# Patient Record
Sex: Male | Born: 1950 | Race: White | Hispanic: No | Marital: Married | Smoking: Never smoker
Health system: Southern US, Community
[De-identification: ages and names within clinical notes are randomized; demographics above are authoritative.]

## PROBLEM LIST (undated history)

## (undated) DIAGNOSIS — I499 Cardiac arrhythmia, unspecified: Secondary | ICD-10-CM

## (undated) DIAGNOSIS — H43813 Vitreous degeneration, bilateral: Secondary | ICD-10-CM

## (undated) DIAGNOSIS — L570 Actinic keratosis: Secondary | ICD-10-CM

## (undated) DIAGNOSIS — M1990 Unspecified osteoarthritis, unspecified site: Secondary | ICD-10-CM

## (undated) DIAGNOSIS — B019 Varicella without complication: Secondary | ICD-10-CM

## (undated) DIAGNOSIS — I4891 Unspecified atrial fibrillation: Secondary | ICD-10-CM

## (undated) DIAGNOSIS — T7840XA Allergy, unspecified, initial encounter: Secondary | ICD-10-CM

## (undated) DIAGNOSIS — Z85828 Personal history of other malignant neoplasm of skin: Secondary | ICD-10-CM

## (undated) DIAGNOSIS — I483 Typical atrial flutter: Secondary | ICD-10-CM

## (undated) DIAGNOSIS — H269 Unspecified cataract: Secondary | ICD-10-CM

## (undated) HISTORY — DX: Typical atrial flutter: I48.3

## (undated) HISTORY — DX: Unspecified osteoarthritis, unspecified site: M19.90

## (undated) HISTORY — PX: MOHS SURGERY: SUR867

## (undated) HISTORY — PX: EYE SURGERY: SHX253

## (undated) HISTORY — DX: Allergy, unspecified, initial encounter: T78.40XA

## (undated) HISTORY — DX: Vitreous degeneration, bilateral: H43.813

## (undated) HISTORY — DX: Actinic keratosis: L57.0

## (undated) HISTORY — DX: Cardiac arrhythmia, unspecified: I49.9

## (undated) HISTORY — DX: Unspecified atrial fibrillation: I48.91

## (undated) HISTORY — DX: Varicella without complication: B01.9

## (undated) HISTORY — DX: Unspecified cataract: H26.9

---
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
## 2014-03-19 HISTORY — PX: CATARACT EXTRACTION, BILATERAL: SHX1313

## 2015-03-24 DIAGNOSIS — E785 Hyperlipidemia, unspecified: Secondary | ICD-10-CM | POA: Insufficient documentation

## 2015-11-29 ENCOUNTER — Ambulatory Visit (INDEPENDENT_AMBULATORY_CARE_PROVIDER_SITE_OTHER): Payer: Medicare Other | Admitting: Primary Care

## 2015-11-29 ENCOUNTER — Encounter: Payer: Self-pay | Admitting: Primary Care

## 2015-11-29 VITALS — BP 118/82 | HR 51 | Temp 97.5°F | Ht 69.75 in | Wt 170.8 lb

## 2015-11-29 DIAGNOSIS — N400 Enlarged prostate without lower urinary tract symptoms: Secondary | ICD-10-CM | POA: Diagnosis not present

## 2015-11-29 DIAGNOSIS — I480 Paroxysmal atrial fibrillation: Secondary | ICD-10-CM

## 2015-11-29 DIAGNOSIS — H1010 Acute atopic conjunctivitis, unspecified eye: Secondary | ICD-10-CM | POA: Insufficient documentation

## 2015-11-29 DIAGNOSIS — Z9079 Acquired absence of other genital organ(s): Secondary | ICD-10-CM | POA: Insufficient documentation

## 2015-11-29 DIAGNOSIS — H1013 Acute atopic conjunctivitis, bilateral: Secondary | ICD-10-CM | POA: Diagnosis not present

## 2015-11-29 MED ORDER — OLOPATADINE HCL 0.1 % OP SOLN: 1.0000 [drp] | Freq: Two times a day (BID) | OPHTHALMIC | 1 refills | Status: DC

## 2015-11-29 NOTE — Assessment & Plan Note (Signed)
Managed on Flomax, overall feels this is working well. Continue same. Check PSA at upcoming physical.

## 2015-11-29 NOTE — Assessment & Plan Note (Signed)
Managed on Multaq 400 mg and aspirin 325 mg. Referral placed for local cardiologist. Rate and rhythm regular today.

## 2015-11-29 NOTE — Progress Notes (Signed)
Subjective:    Patient ID: Brian Glass, male    DOB: 26-Mar-1950, 65 y.o.   MRN: [id number]  HPI  Brian Glass is a 65 year old male who presents today to establish care and discuss the problems mentioned below. Will obtain old records. His last physical was around 1 year ago.   1) BPH: Diagnosed years ago. Currently managed on Flomax 0.4 mg capsules. He was once managed on Proscar and several other medications without improvement. He did experience postural hypotension on other medications.   2) Allergic Conjunctivitis: History of seasonal allergies. He's been taking Flonase and OTC antihistamine drops. His symptoms include burning, watering, itching to his eyes. He was once managed on Patanol drops with improvement and is requesting a prescription today. Denies discharge, fevers, injection to his eyes.  3) Atrial Fibrillation: Currently managed on Aspirin 325 mg and Multaq 400 mg. He was previously following through [facility], but his current cardiologist is retiring. He leads an active lifestyle through biking. He tried to wean off of Multaq but experienced tachycardia during the weaning process. He would like to establish with a local cardiologist as he has recently moved to this area.  Review of Systems  Constitutional: Negative for fever.  HENT: Positive for congestion.   Eyes: Positive for itching. Negative for pain, discharge, redness and visual disturbance.  Respiratory: Negative for cough.   Cardiovascular: Negative for chest pain and palpitations.  Genitourinary: Negative for difficulty urinating.       Past Medical History:  Diagnosis Date  . Allergy   . Arthritis   . Atrial fibrillation ([place])   . Bilateral vitreous detachment   . Chickenpox      Social History   Social History  . Marital status: Married    Spouse name: N/A  . Number of children: N/A  . Years of education: N/A   Occupational History  . Not on file.   Social History Main Topics  . Smoking status:  Never Smoker  . Smokeless tobacco: Never Used  . Alcohol use Yes     Comment: soical  . Drug use: Unknown  . Sexual activity: Not on file   Other Topics Concern  . Not on file   Social History Narrative   Married.   3 children. 2 grandchildren.   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    Past Surgical History:  Procedure Laterality Date  . CATARACT EXTRACTION, BILATERAL  2016  . TONSILLECTOMY  1955    Family History  Problem Relation Age of Onset  . Hyperlipidemia Mother   . Heart disease Mother   . Hypertension Mother   . Hyperlipidemia Father   . Heart disease Father   . Stroke Father   . Hypertension Father   . Heart disease Brother   . Prostate cancer Brother   . Atrial fibrillation Brother   . Sleep apnea Brother     Allergies  Allergen Reactions  . Sudafed [Pseudoephedrine Hcl]     No current outpatient prescriptions on file prior to visit.   No current facility-administered medications on file prior to visit.     BP 118/82   Pulse (!) 51   Temp 97.5 F (36.4 C) (Oral)   Ht 5' 9.75" (1.772 m)   Wt 170 lb 12.8 oz (77.5 kg)   SpO2 98%   BMI 24.68 kg/m    Objective:   Physical Exam  Constitutional: He is oriented to person, place, and time. He  appears well-nourished.  Eyes: Conjunctivae are normal. Right eye exhibits no discharge. Left eye exhibits no discharge.  Neck: Neck supple.  Cardiovascular: Normal rate and regular rhythm.   Pulmonary/Chest: Effort normal and breath sounds normal. He has no wheezes. He has no rales.  Neurological: He is alert and oriented to person, place, and time.  Skin: Skin is warm and dry.  Psychiatric: He has a normal mood and affect.          Assessment & Plan:

## 2015-11-29 NOTE — Progress Notes (Signed)
Pre visit review using our clinic review tool, if applicable. No additional management support is needed unless otherwise documented below in the visit note. 

## 2015-11-29 NOTE — Patient Instructions (Signed)
You will be contacted regarding your referral to Cardiology.  Please let us know if you have not heard back within one week.   I sent a prescription for Patanol eye drops to your pharmacy. Instill 1 drop into both eyes twice daily as needed.  Please schedule a physical with me in December/January. You may also schedule a lab only appointment 3-4 days prior. We will discuss your lab results in detail during your physical.  It was a pleasure to meet you today! Please don't hesitate to call me with any questions. Welcome to [facility]!

## 2015-11-29 NOTE — Assessment & Plan Note (Signed)
History of seasonal allergies. Exam today without evidence of bacterial involvement. Rx for Patanol provided.

## 2015-11-30 ENCOUNTER — Encounter: Payer: Self-pay | Admitting: Cardiology

## 2015-11-30 ENCOUNTER — Ambulatory Visit (INDEPENDENT_AMBULATORY_CARE_PROVIDER_SITE_OTHER): Payer: Medicare Other | Admitting: Cardiology

## 2015-11-30 ENCOUNTER — Encounter: Payer: Self-pay | Admitting: Primary Care

## 2015-11-30 VITALS — BP 108/68 | HR 48 | Ht 69.0 in | Wt 169.0 lb

## 2015-11-30 DIAGNOSIS — I440 Atrioventricular block, first degree: Secondary | ICD-10-CM | POA: Diagnosis not present

## 2015-11-30 DIAGNOSIS — I480 Paroxysmal atrial fibrillation: Secondary | ICD-10-CM

## 2015-11-30 DIAGNOSIS — R001 Bradycardia, unspecified: Secondary | ICD-10-CM

## 2015-11-30 NOTE — Patient Instructions (Addendum)
Medication Instructions:  Your physician has recommended you make the following change in your medication:  1. DECREASE Multaq to 200 mg Twice daily  Testing/Procedures: Your physician has recommended that you wear a holter monitor. Holter monitors are medical devices that record the heart's electrical activity. Doctors most often use these monitors to diagnose arrhythmias. Arrhythmias are problems with the speed or rhythm of the heartbeat. The monitor is a small, portable device. You can wear one while you do your normal daily activities. This is usually used to diagnose what is causing palpitations/syncope (passing out).  Your physician has requested that you have an echocardiogram. Echocardiography is a painless test that uses sound waves to create images of your heart. It provides your doctor with information about the size and shape of your heart and how well your heart's chambers and valves are working. This procedure takes approximately one hour. There are no restrictions for this procedure.    Follow-Up: Your physician recommends that you schedule a follow-up appointment in: 1 month with Dr. Yvone Neu.  It was a pleasure seeing you today here in the office. Please do not hesitate to give [place] a call back if you have any further questions. [place], BSN      Echocardiogram An echocardiogram, or echocardiography, uses sound waves (ultrasound) to produce an image of your heart. The echocardiogram is simple, painless, obtained within a short period of time, and offers valuable information to your health care provider. The images from an echocardiogram can provide information such as:  Evidence of coronary artery disease (CAD).  Heart size.  Heart muscle function.  Heart valve function.  Aneurysm detection.  Evidence of a past heart attack.  Fluid buildup around the heart.  Heart muscle thickening.  Assess heart valve function. LET [facility] CARE PROVIDER  KNOW ABOUT:  Any allergies you have.  All medicines you are taking, including vitamins, herbs, eye drops, creams, and over-the-counter medicines.  Previous problems you or members of your family have had with the use of anesthetics.  Any blood disorders you have.  Previous surgeries you have had.  Medical conditions you have.  Possibility of pregnancy, if this applies. BEFORE THE PROCEDURE  No special preparation is needed. Eat and drink normally.  PROCEDURE   In order to produce an image of your heart, gel will be applied to your chest and a wand-like tool (transducer) will be moved over your chest. The gel will help transmit the sound waves from the transducer. The sound waves will harmlessly bounce off your heart to allow the heart images to be captured in real-time motion. These images will then be recorded.  You may need an IV to receive a medicine that improves the quality of the pictures. AFTER THE PROCEDURE You may return to your normal schedule including diet, activities, and medicines, unless your health care provider tells you otherwise.   This information is not intended to replace advice given to you by your health care provider. Make sure you discuss any questions you have with your health care provider.   Document Released: 03/02/2000 Document Revised: 03/26/2014 Document Reviewed: 11/10/2012 Elsevier Interactive Patient Education 2016 Elsevier Inc.      Holter Monitoring A Holter monitor is a small device that is used to detect abnormal heart rhythms. It clips to your clothing and is connected by wires to flat, sticky disks (electrodes) that attach to your chest. It is worn continuously for 24-48 hours. HOME CARE INSTRUCTIONS  Wear your  Holter monitor at all times, even while exercising and sleeping, for as long as directed by your health care provider.  Make sure that the Holter monitor is safely clipped to your clothing or close to your body as recommended  by your health care provider.  Do not get the monitor or wires wet.  Do not put body lotion or moisturizer on your chest.  Keep your skin clean.  Keep a diary of your daily activities, such as walking and doing chores. If you feel that your heartbeat is abnormal or that your heart is fluttering or skipping a beat:  Record what you are doing when it happens.  Record what time of day the symptoms occur.  Return your Holter monitor as directed by your health care provider.  Keep all follow-up visits as directed by your health care provider. This is important. SEEK IMMEDIATE MEDICAL CARE IF:  You feel lightheaded or you faint.  You have trouble breathing.  You feel pain in your chest, upper arm, or jaw.  You feel sick to your stomach and your skin is pale, cool, or damp.  You heartbeat feels unusual or abnormal.   This information is not intended to replace advice given to you by your health care provider. Make sure you discuss any questions you have with your health care provider.   Document Released: 12/02/2003 Document Revised: 03/26/2014 Document Reviewed: 10/12/2013 Elsevier Interactive Patient Education [employer].

## 2015-11-30 NOTE — Progress Notes (Signed)
Cardiology Office Note   Date:  11/30/2015   ID:  Brian Glass, DOB [birth year], MRN [id number]  Referring Doctor:  [name], NP   Cardiologist:   Wende Bushy, MD   Reason for consultation:  Chief Complaint  Patient presents with  . other    AFIB. Meds reviewed verbally with pt.      History of Present Illness: Brian Glass is a 64 y.o. male who presents forEstablishing care for atrial fibrillation. Per medical records and per patient report, this was diagnosed 6 years ago. He remembers going on a bike ride at that time. He noted on a heart rate monitor that his heart rate was up in the 200s. He denies feeling his heart racing. No chest pain. No shortness of breath. He presented to the emergency room at some point. He was told that his troponin was elevated and therefore was admitted. He underwent workup. Apparently stress is was negative at that time. He was started on medications for the atrial fibrillation. He has not been really symptomatic.  He continues to be physically active. He can bike 75 miles per week. No chest pains, shortness of breath, palpitations, racing heartbeat, syncope. He has problems with low blood pressure with some lightheadedness but nothing that is functionally limiting.  ROS:  Please see the history of present illness. Aside from mentioned under HPI, all other systems are reviewed and negative.     Past Medical History:  Diagnosis Date  . Allergy   . Arthritis   . Atrial fibrillation ([place])   . Bilateral vitreous detachment   . Chickenpox     Past Surgical History:  Procedure Laterality Date  . CATARACT EXTRACTION, BILATERAL  2016  . TONSILLECTOMY  1955     reports that he has never smoked. He has never used smokeless tobacco. He reports that he drinks alcohol. He reports that he does not use drugs.   family history includes Atrial fibrillation in his brother; Heart attack in his father; Heart disease in his brother, father,  and mother; Hyperlipidemia in his father and mother; Hypertension in his father and mother; Prostate cancer in his brother; Sleep apnea in his brother; Stroke in his father.   Outpatient Medications Prior to Visit  Medication Sig Dispense Refill  . aspirin 325 MG EC tablet Take 325 mg by mouth every other day.    . Calcium Polycarbophil (EQ FIBER LAXATIVE PO) Take 1 capsule by mouth 2 (two) times daily.    . fluticasone (FLONASE) 50 MCG/ACT nasal spray Place 1 spray into both nostrils daily.    . MULTAQ 400 MG tablet Take 200 mg by mouth 2 (two) times daily with a meal.    . Multiple Vitamins-Minerals (SENIOR MULTIVITAMIN PLUS PO) Take 1 tablet by mouth daily.    Marland Kitchen olopatadine (PATANOL) 0.1 % ophthalmic solution Place 1 drop into both eyes 2 (two) times daily. 5 mL 1  . Omega-3 Fatty Acids (FISH OIL) 1200 MG CAPS Take 1 capsule by mouth daily.    . tamsulosin (FLOMAX) 0.4 MG CAPS capsule Take 0.4 mg by mouth daily.     No facility-administered medications prior to visit.      Allergies: Sudafed [pseudoephedrine hcl]    PHYSICAL EXAM: VS:  BP 108/68 (BP Location: Right Arm, Patient Position: Sitting, Cuff Size: Normal)   Pulse (!) 48   Ht 5\' 9"  (1.753 m)   Wt 169 lb (76.7 kg)   BMI 24.96 kg/m  , Body mass index  is 24.96 kg/m. Wt Readings from Last 3 Encounters:  11/30/15 169 lb (76.7 kg)  11/29/15 170 lb 12.8 oz (77.5 kg)    GENERAL:  well developed, well nourished, not in acute distress HEENT: normocephalic, pink conjunctivae, anicteric sclerae, no xanthelasma, normal dentition, oropharynx clear NECK:  no neck vein engorgement, JVP normal, no hepatojugular reflux, carotid upstroke brisk and symmetric, no bruit, no thyromegaly, no lymphadenopathy LUNGS:  good respiratory effort, clear to auscultation bilaterally CV:  PMI not displaced, no thrills, no lifts, S1 and S2 within normal limits, no palpable S3 or S4, no murmurs, no rubs, no gallops ABD:  Soft, nontender, nondistended,  normoactive bowel sounds, no abdominal aortic bruit, no hepatomegaly, no splenomegaly MS: nontender back, no kyphosis, no scoliosis, no joint deformities EXT:  2+ DP/PT pulses, no edema, no varicosities, no cyanosis, no clubbing SKIN: warm, nondiaphoretic, normal turgor, no ulcers NEUROPSYCH: alert, oriented to person, place, and time, sensory/motor grossly intact, normal mood, appropriate affect  Recent Labs: No results found for requested labs within last 8760 hours.   Lipid Panel No results found for: CHOL, TRIG, HDL, CHOLHDL, VLDL, LDLCALC, LDLDIRECT   Other studies Reviewed:  EKG:  The ekg from 11/30/2015 was personally reviewed by me and it revealed sinus bradycardia, 48 BPM. First-degree AV block 222 ms. QT 510 ms/QTC 455 ms.  Additional studies/ records that were reviewed personally reviewed by me today include:  Echo 11/08/2014: Normal left ventricular systolic function, ejection fraction 55 to 60,  55 - 60%  Degenerative mitral valve disease  Mitral regurgitation - mild  Dilated left atrium - mild  Dilated ascending aorta  Normal right ventricular systolic function  Mildly elevated right atrial pressure  ASSESSMENT AND PLAN: Paroxysmal atrial fibrillation Sinus bradycardia with first-degree AV block on EKG today  He does not report any symptoms. Recommend to decrease the dose of multaq 200 mg twice a day. 24-hour Holter post-one week. Follow-up in the office in one month. CHADSVaSc2=0 continue full dose aspirin for now. No issues with bleeding. Check echocardiogram.         Current medicines are reviewed at length with the patient today.  The patient does not have concerns regarding medicines.  Labs/ tests ordered today include:  Orders Placed This Encounter  Procedures  . Holter monitor - 24 hour  . EKG 12-Lead  . ECHOCARDIOGRAM COMPLETE    I had a lengthy and detailed discussion with the patient regarding diagnoses, prognosis, diagnostic options,  treatment options , and side effects of medications.   I counseled the patient on importance of lifestyle modification including heart healthy diet, regular physical activity.   Disposition:   FU with undersigned after tests   I spent at least 45 minutes with the patient today and more than 50% of the time was spent counseling the patient and coordinating care.       Signed, Wende Bushy, MD  11/30/2015 5:35 PM    [place]  This note was generated in part with voice recognition software and I apologize for any typographical errors that were not detected and corrected.

## 2015-12-13 ENCOUNTER — Ambulatory Visit: Payer: Medicare Other

## 2015-12-13 ENCOUNTER — Telehealth: Payer: Self-pay | Admitting: Primary Care

## 2015-12-13 DIAGNOSIS — Z1159 Encounter for screening for other viral diseases: Secondary | ICD-10-CM

## 2015-12-13 NOTE — Telephone Encounter (Signed)
I typically complete this during an annual physical, but if he'd like to have this done without a physical then please schedule him for lab only appointment at his convenience. Also, make sure he's never had this before as this is a one time screening. Insurance will only cover once.

## 2015-12-13 NOTE — Telephone Encounter (Signed)
-----   Message from Clancy Gourd, [place] sent at 12/12/2015  8:21 AM EDT ----- Regarding: Hep C Screening Hi Anda Kraft, Mr. Mcgavock would like to get his Hep C Screening done. Please let me know when I can schedule him. Thanks, [employer]

## 2015-12-14 ENCOUNTER — Encounter: Payer: Self-pay | Admitting: *Deleted

## 2015-12-16 ENCOUNTER — Ambulatory Visit (INDEPENDENT_AMBULATORY_CARE_PROVIDER_SITE_OTHER): Payer: Medicare Other

## 2015-12-16 DIAGNOSIS — Z23 Encounter for immunization: Secondary | ICD-10-CM

## 2015-12-21 DIAGNOSIS — Z961 Presence of intraocular lens: Secondary | ICD-10-CM | POA: Diagnosis not present

## 2015-12-21 DIAGNOSIS — H43393 Other vitreous opacities, bilateral: Secondary | ICD-10-CM | POA: Diagnosis not present

## 2015-12-21 DIAGNOSIS — H43813 Vitreous degeneration, bilateral: Secondary | ICD-10-CM | POA: Diagnosis not present

## 2015-12-22 ENCOUNTER — Ambulatory Visit (INDEPENDENT_AMBULATORY_CARE_PROVIDER_SITE_OTHER): Payer: Medicare Other

## 2015-12-22 ENCOUNTER — Other Ambulatory Visit: Payer: Self-pay

## 2015-12-22 DIAGNOSIS — I480 Paroxysmal atrial fibrillation: Secondary | ICD-10-CM

## 2015-12-27 ENCOUNTER — Ambulatory Visit
Admission: RE | Admit: 2015-12-27 | Discharge: 2015-12-27 | Disposition: A | Discharge status: Home / Self Care | Payer: Medicare Other | Source: Ambulatory Visit | Attending: Cardiology | Admitting: Cardiology

## 2015-12-27 DIAGNOSIS — I480 Paroxysmal atrial fibrillation: Secondary | ICD-10-CM | POA: Diagnosis not present

## 2015-12-30 ENCOUNTER — Encounter: Payer: Self-pay | Admitting: Primary Care

## 2016-01-04 ENCOUNTER — Encounter: Payer: Self-pay | Admitting: Cardiology

## 2016-01-04 ENCOUNTER — Ambulatory Visit (INDEPENDENT_AMBULATORY_CARE_PROVIDER_SITE_OTHER): Payer: Medicare Other | Admitting: Cardiology

## 2016-01-04 VITALS — BP 110/70 | HR 52 | Ht 70.0 in | Wt 171.0 lb

## 2016-01-04 DIAGNOSIS — R001 Bradycardia, unspecified: Secondary | ICD-10-CM | POA: Diagnosis not present

## 2016-01-04 DIAGNOSIS — I440 Atrioventricular block, first degree: Secondary | ICD-10-CM | POA: Diagnosis not present

## 2016-01-04 DIAGNOSIS — I4892 Unspecified atrial flutter: Secondary | ICD-10-CM | POA: Diagnosis not present

## 2016-01-04 DIAGNOSIS — I480 Paroxysmal atrial fibrillation: Secondary | ICD-10-CM | POA: Diagnosis not present

## 2016-01-04 NOTE — Progress Notes (Signed)
Cardiology Office Note   Date:  01/04/2016   ID:  Brian Glass, DOB 1951-03-17, MRN [id number]  Referring Doctor:  [name], NP   Cardiologist:   Wende Bushy, MD   Reason for consultation:  Chief Complaint  Patient presents with  . PAFIB  . sinus bradycardia  . 1st degree av block      History of Present Illness: Brian Glass is a 65 y.o. male who presents for Follow-up after testing  As noted previously, Per medical records and per patient report, this was diagnosed 6 years ago. He remembers going on a bike ride at that time. He noted on a heart rate monitor that his heart rate was up in the 200s. He denies feeling his heart racing. No chest pain. No shortness of breath. He presented to the emergency room at some point. He was told that his troponin was elevated and therefore was admitted. He underwent workup. Apparently stress is was negative at that time. He was started on medications for the atrial fibrillation. He has not been really symptomatic.  Since last visit, he continues to be physically active. He regularly bikes, roughly 75 miles per week. Recently, with a decrease in the dose of Multaq, he has noted elevation in his heart rate based on his monitor to up to 170s maximum. It was seen in the 150s to 160s for roughly 20 minutes during the biking. This was something unusual for him. He does feel sensation in his chest which she feels is his rapid heart rate. He denies chest pain and shortness of breath. He also has noticed that his heart rate would remain in the 120s, several hours after the end of his biking activity. Again, this was rather unusual for him. His artery would usually dropped down to the normal range right away after end of exercise or biking.  Another thing he has noticed was an episode when his monitor registered is harder to be in the 30s for quite a few minutes. This was in the evening while he was resting and not doing anything. He went  up to get his blood pressure checked and by that time the blood pressure monitor registered a heart rate in the 50s. He denies feeling lightheaded or dizzy at that time.  ROS:  Please see the history of present illness. Aside from mentioned under HPI, all other systems are reviewed and negative.     Past Medical History:  Diagnosis Date  . Allergy   . Arthritis   . Atrial fibrillation ([place])   . Bilateral vitreous detachment   . Chickenpox     Past Surgical History:  Procedure Laterality Date  . CATARACT EXTRACTION, BILATERAL  2016  . TONSILLECTOMY  1955     reports that he has never smoked. He has never used smokeless tobacco. He reports that he drinks alcohol. He reports that he does not use drugs.   family history includes Atrial fibrillation in his brother; Heart attack in his father; Heart disease in his brother, father, and mother; Hyperlipidemia in his father and mother; Hypertension in his father and mother; Prostate cancer in his brother; Sleep apnea in his brother; Stroke in his father.   Outpatient Medications Prior to Visit  Medication Sig Dispense Refill  . aspirin 325 MG EC tablet Take 325 mg by mouth every other day.    . Calcium Polycarbophil (EQ FIBER LAXATIVE PO) Take 1 capsule by mouth 2 (two) times daily.    . fluticasone (  FLONASE) 50 MCG/ACT nasal spray Place 1 spray into both nostrils daily.    . MULTAQ 400 MG tablet Take 200 mg by mouth 2 (two) times daily with a meal.    . Multiple Vitamins-Minerals (SENIOR MULTIVITAMIN PLUS PO) Take 1 tablet by mouth daily.    Marland Kitchen olopatadine (PATANOL) 0.1 % ophthalmic solution Place 1 drop into both eyes 2 (two) times daily. 5 mL 1  . Omega-3 Fatty Acids (FISH OIL) 1200 MG CAPS Take 1 capsule by mouth daily.    . tamsulosin (FLOMAX) 0.4 MG CAPS capsule Take 0.4 mg by mouth daily.     No facility-administered medications prior to visit.      Allergies: Sudafed [pseudoephedrine hcl]    PHYSICAL EXAM: VS:  BP 110/70    Pulse (!) 52   Ht 5\' 10"  (1.778 m)   Wt 171 lb (77.6 kg)   SpO2 99%   BMI 24.54 kg/m  , Body mass index is 24.54 kg/m. Wt Readings from Last 3 Encounters:  01/04/16 171 lb (77.6 kg)  11/30/15 169 lb (76.7 kg)  11/29/15 170 lb 12.8 oz (77.5 kg)    GENERAL:  well developed, well nourished, not in acute distress HEENT: normocephalic, pink conjunctivae, anicteric sclerae, no xanthelasma, normal dentition, oropharynx clear NECK:  no neck vein engorgement, JVP normal, no hepatojugular reflux, carotid upstroke brisk and symmetric, no bruit, no thyromegaly, no lymphadenopathy LUNGS:  good respiratory effort, clear to auscultation bilaterally CV:  PMI not displaced, no thrills, no lifts, S1 and S2 within normal limits, no palpable S3 or S4, no murmurs, no rubs, no gallops ABD:  Soft, nontender, nondistended, normoactive bowel sounds, no abdominal aortic bruit, no hepatomegaly, no splenomegaly MS: nontender back, no kyphosis, no scoliosis, no joint deformities EXT:  2+ DP/PT pulses, no edema, no varicosities, no cyanosis, no clubbing SKIN: warm, nondiaphoretic, normal turgor, no ulcers NEUROPSYCH: alert, oriented to person, place, and time, sensory/motor grossly intact, normal mood, appropriate affect  Recent Labs: No results found for requested labs within last 8760 hours.   Lipid Panel No results found for: CHOL, TRIG, HDL, CHOLHDL, VLDL, LDLCALC, LDLDIRECT   Other studies Reviewed:  EKG:  The ekg from 11/30/2015 was personally reviewed by me and it revealed sinus bradycardia, 48 BPM. First-degree AV block 222 ms. QT 510 ms/QTC 455 ms.  Additional studies/ records that were reviewed personally reviewed by me today include:  Echo 11/08/2014: Normal left ventricular systolic function, ejection fraction 55 to 60,  55 - 60%  Degenerative mitral valve disease  Mitral regurgitation - mild  Dilated left atrium - mild  Dilated ascending aorta  Normal right ventricular systolic  function  Mildly elevated right atrial pressure  Echo 12/22/2015: Left ventricle: The cavity size was normal. Systolic function was   normal. The estimated ejection fraction was in the range of 60%   to 65%. Wall motion was normal; there were no regional wall   motion abnormalities. Left ventricular diastolic function   parameters were normal. - Aortic valve: There was mild regurgitation. - Ascending aorta: The ascending aorta was mildly dilated. - Left atrium: The atrium was normal in size. - Right ventricle: Systolic function was normal. - Pulmonary arteries: Systolic pressure was borderline elevated. PA   peak pressure: 36 mm Hg (S).  Holter monitor 12/22/2015:  24-hour Holter monitor  Overall rhythm seems to be sinus, minimal 48 bpm, average of 62 BPM. 53% of the total number of beats in bradycardia.  No high-grade ventricular ectopy: 68  isolated P cc, 3 bigeminal cycles.  Supraventricular ectopy: 175 isolated PACs, 32 atrial couplets. At least 10 atrial runs totaling at least 44 beats. The runs appear to be atrial flutter with 2:1/ 3:1/ variable AV conduction. Maximum heart rate was 142 BPM. No detected atrial fibrillation.  ASSESSMENT AND PLAN: Paroxysmal atrial fibrillation Sinus bradycardia with first-degree AV block    He does not report any symptoms of chest pain and shortness of breath. He remains to be physically active. With a decrease in multaq dose, he has noted episodes of tachycardia.  The dose of multaq was previously decreased to 200 mg twice a day due to presence of bradycardia, first-degree AV block, and QT prolongation. Holter was planned and this subsequently revealed the results as mentioned above. Notably, he  appearedhave runs of likely atrial flutter, maximum heart rate of 140 BPM. All these findings were discussed at length with patient. EP evaluation is recommended at this point. Even though his CHADSVaSc2=0 previously but now is 1 since he  celebrated a birthday recently, in light of more recent episodes of atrial flutter, and possibly need for atrial flutter ablation, we discussed use of a NOAC for stroke risk reduction. Pt hesitant but after lengthy discussion (in office and phone conversation), we will try Eliquis 5mg  bid, check CMP.  We will try to set him up with Dr. [name] as soon as possible.    Current medicines are reviewed at length with the patient today.  The patient does not have concerns regarding medicines.  Labs/ tests ordered today include:  No orders of the defined types were placed in this encounter.   I had a lengthy and detailed discussion with the patient regarding diagnoses, prognosis, diagnostic options, treatment options , and side effects of medications.   I counseled the patient on importance of lifestyle modification including heart healthy diet, regular physical activity.   Disposition:   FU with undersigned 3-27months  I spent at least 40 minutes with the patient today and more than 50% of the time was spent counseling the patient and coordinating care.    Signed, Wende Bushy, MD  01/04/2016 1:19 PM    [place]  This note was generated in part with voice recognition software and I apologize for any typographical errors that were not detected and corrected.

## 2016-01-04 NOTE — Patient Instructions (Addendum)
Follow-Up: Your physician recommends that you schedule a follow-up appointment in 4 months with Dr. Yvone Neu.  You have been referred to Dr. [name] to be evaluated.   It was a pleasure seeing you today here in the office. Please do not hesitate to give [place] a call back if you have any further questions. [place], BSN

## 2016-01-05 ENCOUNTER — Telehealth: Payer: Self-pay | Admitting: Cardiology

## 2016-01-05 ENCOUNTER — Other Ambulatory Visit: Payer: Self-pay | Admitting: *Deleted

## 2016-01-05 ENCOUNTER — Other Ambulatory Visit
Admission: RE | Admit: 2016-01-05 | Discharge: 2016-01-05 | Disposition: A | Discharge status: Home / Self Care | Payer: Medicare Other | Source: Ambulatory Visit | Attending: Cardiology | Admitting: Cardiology

## 2016-01-05 ENCOUNTER — Encounter: Payer: Self-pay | Admitting: Cardiology

## 2016-01-05 DIAGNOSIS — I480 Paroxysmal atrial fibrillation: Secondary | ICD-10-CM | POA: Insufficient documentation

## 2016-01-05 LAB — COMPREHENSIVE METABOLIC PANEL
ALBUMIN: 4 g/dL (ref 3.5–5.0)
ALT: 22 U/L (ref 17–63)
AST: 23 U/L (ref 15–41)
Alkaline Phosphatase: 86 U/L (ref 38–126)
Anion gap: 5 (ref 5–15)
BUN: 24 mg/dL — AB (ref 6–20)
CHLORIDE: 105 mmol/L (ref 101–111)
CO2: 30 mmol/L (ref 22–32)
Calcium: 9.1 mg/dL (ref 8.9–10.3)
Creatinine, Ser: 1.13 mg/dL (ref 0.61–1.24)
GFR calc Af Amer: >60 mL/min (ref >60)
GFR calc non Af Amer: >60 mL/min (ref >60)
GLUCOSE: 86 mg/dL (ref 65–99)
POTASSIUM: 4.6 mmol/L (ref 3.5–5.1)
Sodium: 140 mmol/L (ref 135–145)
Total Bilirubin: 1.4 mg/dL — ABNORMAL HIGH (ref 0.3–1.2)
Total Protein: 6.8 g/dL (ref 6.5–8.1)

## 2016-01-05 MED ORDER — APIXABAN 5 MG PO TABS: 5.0000 mg | ORAL_TABLET | Freq: Two times a day (BID) | ORAL | 6 refills | Status: DC

## 2016-01-05 MED ORDER — APIXABAN 5 MG PO TABS
5.0000 mg | ORAL_TABLET | Freq: Two times a day (BID) | ORAL | 3 refills | Status: DC
Start: 2016-01-05 — End: 2016-08-08

## 2016-01-05 NOTE — Telephone Encounter (Signed)
Patient here today to have some STAT labs done per Dr. Tora Kindred request. Dr. Yvone Neu wants him to start on Eliquis 5 mg twice daily pending lab results. Instructed him that I would call him with results and further instructions regarding when to start medication. He verbalized understanding of all instructions and had no further questions at this time.   Medication Samples have been provided to the patient.  Drug name: Eliquis       Strength: 2.5 mg        Qty: 4 boxes  LOT: [id number] Exp.Date: Dec 2018  Dosing instructions: Take 2 tablets twice daily. Pending lab results we will then send in a order for Eliquis 5 mg one tablet twice daily.   The patient has been instructed regarding the correct time, dose, and frequency of taking this medication, including desired effects and most common side effects.   Valora Corporal 9:42 AM 01/05/2016

## 2016-01-05 NOTE — Telephone Encounter (Signed)
Reviewed lab results with patient and instructed him to start eliquis as we previously discussed. Reviewed dosage and frequency with him again. He verbalized understanding and had no further questions at this time. Prescription sent in to his pharmacy.

## 2016-01-06 LAB — HEPATITIS C ANTIBODY: HCV Ab: <0.1 s/co ratio (ref 0.0–0.9)

## 2016-01-12 ENCOUNTER — Encounter: Payer: Self-pay | Admitting: Internal Medicine

## 2016-01-25 ENCOUNTER — Ambulatory Visit (INDEPENDENT_AMBULATORY_CARE_PROVIDER_SITE_OTHER): Payer: Medicare Other | Admitting: Internal Medicine

## 2016-01-25 ENCOUNTER — Encounter: Payer: Self-pay | Admitting: Internal Medicine

## 2016-01-25 VITALS — BP 104/60 | HR 52 | Ht 70.0 in | Wt 169.6 lb

## 2016-01-25 DIAGNOSIS — I480 Paroxysmal atrial fibrillation: Secondary | ICD-10-CM | POA: Diagnosis not present

## 2016-01-25 DIAGNOSIS — I440 Atrioventricular block, first degree: Secondary | ICD-10-CM | POA: Diagnosis not present

## 2016-01-25 NOTE — Patient Instructions (Signed)
Medication Instructions: - Your physician has recommended you make the following change in your medication:  1) Stop aspirin 2) Stop multaq (dronedarone)   Labwork: - none ordered  Procedures/Testing: - none ordered  Follow-Up: - pending record review  Any Additional Special Instructions Will Be Listed Below (If Applicable). - please bring any information that may be helpful to Dr. [name] to the office in [place] (attn: Dr. Othella Boyer- RN)    If you need a refill on your cardiac medications before your next appointment, please call your pharmacy.

## 2016-01-25 NOTE — Progress Notes (Signed)
ELECTROPHYSIOLOGY CONSULT NOTE  Patient ID: Brian Glass, MRN: [id number], DOB/AGE: October 03, 1950 65 y.o. Admit date: (Not on file) Date of Consult: 01/25/2016  Primary Physician: [name], NP Primary Cardiologist: AI Consulting Physician Ai  Chief Complaint: Atrial fib and flutter   HPI Brian Glass is a 65 y.o. 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 Follow-up appointment a couple of weeks later cardiologist where atrial fibrillation was diagnosed. At some point he was started on aspirin and some point started on dronaderone which he has been taking since.  He went to establish with Dr. Loletha Grayer, she noted the prolongation and some bradycardia decrease her amiodarone 400-200 twice a day and with evidence of atrial flutter on his Holter monitor initiated anticoagulation with ELIQUIS.   Past Medical History:  Diagnosis Date  . Allergy   . Arthritis   . Atrial fibrillation ([place])   . Bilateral vitreous detachment   . Chickenpox       Surgical History:  Past Surgical History:  Procedure Laterality Date  . CATARACT EXTRACTION, BILATERAL  2016  . TONSILLECTOMY  1955     Home Meds: Prior to Admission medications   Medication Sig Start Date End Date Taking? Authorizing Provider  apixaban (ELIQUIS) 5 MG TABS tablet Take 1 tablet (5 mg total) by mouth 2 (two) times daily. 01/05/16  Yes Wende Bushy, MD  aspirin 325 MG EC tablet Take 325 mg by mouth every other day.   Yes Historical Provider, MD  Calcium Polycarbophil (EQ FIBER LAXATIVE PO) Take 1 capsule by mouth 2 (two) times daily.   Yes Historical Provider, MD  fluticasone (FLONASE) 50 MCG/ACT nasal spray Place 1 spray into both nostrils daily.   Yes Historical Provider, MD  MULTAQ 400 MG tablet Take 200 mg by mouth 2 (two) times daily with a meal. 10/15/15  Yes Historical  Provider, MD  Multiple Vitamins-Minerals (SENIOR MULTIVITAMIN PLUS PO) Take 1 tablet by mouth daily.   Yes Historical Provider, MD  olopatadine (PATANOL) 0.1 % ophthalmic solution Place 1 drop into both eyes 2 (two) times daily. 11/29/15  Yes Pleas Koch, NP  Omega-3 Fatty Acids (FISH OIL) 1200 MG CAPS Take 1 capsule by mouth daily.   Yes Historical Provider, MD  tamsulosin (FLOMAX) 0.4 MG CAPS capsule Take 0.4 mg by mouth daily.   Yes Historical Provider, MD    Allergies:  Allergies  Allergen Reactions  . Sudafed [Pseudoephedrine Hcl]     Social History   Social History  . Marital status: Married    Spouse name: N/A  . Number of children: N/A  . Years of education: N/A   Occupational History  . Not on file.   Social History Main Topics  . Smoking status: Never Smoker  . Smokeless tobacco: Never Used  . Alcohol use Yes     Comment: social  . Drug use: No  . Sexual activity: Not on file   Other Topics Concern  . Not on file   Social History Narrative   Married.   3 children. 2 grandchildren.   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     Family History  Problem Relation Age of Onset  . Hyperlipidemia Mother   . Heart disease Mother   . Hypertension Mother   . Hyperlipidemia Father   . Heart  disease Father   . Stroke Father   . Hypertension Father   . Heart attack Father   . Heart disease Brother   . Prostate cancer Brother   . Atrial fibrillation Brother   . Sleep apnea Brother      ROS:  Please see the history of present illness.     All other systems reviewed and negative.    Physical Exam: Blood pressure 104/60, pulse (!) 52, height 5\' 10"  (1.778 m), weight 169 lb 9.6 oz (76.9 kg), SpO2 98 %. General: Well developed, well nourished male in no acute distress. Head: Normocephalic, atraumatic, sclera non-icteric, no xanthomas, nares are without discharge. EENT: normal  Lymph Nodes:  none Neck: Negative for carotid bruits. JVD  not elevated. Back:without scoliosis kyphosis Lungs: Clear bilaterally to auscultation without wheezes, rales, or rhonchi. Breathing is unlabored. Heart: RRR with S1 S2. No   murmur . No rubs, or gallops appreciated. Abdomen: Soft, non-tender, non-distended with normoactive bowel sounds. No hepatomegaly. No rebound/guarding. No obvious abdominal masses. Msk:  Strength and tone appear normal for age. Extremities: No clubbing or cyanosis. No  edema.  Distal pedal pulses are 2+ and equal bilaterally. Skin: Warm and Dry Neuro: Alert and oriented X 3. CN III-XII intact Grossly normal sensory and motor function . Psych:  Responds to questions appropriately with a normal affect.      Labs: Cardiac Enzymes No results for input(s): CKTOTAL, CKMB, TROPONINI in the last 72 hours. CBC No results found for: WBC, HGB, HCT, MCV, PLT PROTIME: No results for input(s): LABPROT, INR in the last 72 hours. Chemistry No results for input(s): NA, K, CL, CO2, BUN, CREATININE, CALCIUM, PROT, BILITOT, ALKPHOS, ALT, AST, GLUCOSE in the last 168 hours.  Invalid input(s): LABALBU Lipids No results found for: CHOL, HDL, LDLCALC, TRIG BNP No results found for: PROBNP Thyroid Function Tests: No results for input(s): TSH, T4TOTAL, T3FREE, THYROIDAB in the last 72 hours.  Invalid input(s): FREET3 Miscellaneous No results found for: DDIMER  Radiology/Studies:  No results found.  EKG:  Sinus and 50 Intervals 20/10/35 qR in V1 Holter monitor reviewed personally atrial flutter noted with very able conduction and frequent PACs and nonsustained atrial tachycardia  Assessment and Plan:  Atrial flutter  Atrial fibrillation-history   The patient has documented atrial flutter. He has frequent PACs making the likelihood that his atrial fibrillation history is in fact may well be a consequence of the dronaderone. In any case, understanding whether atrial flutter atrial fibrillation with initial rhythm is key to  informing therapeutic choices. In the event it is atrial fibrillation, the absence of symptoms, medical therapy and anticoagulation would be recommended. However, if atrial flutter were the initial rhythm and again seeing now, I would recommend that we undertake catheter ablation of his atrial flutter substrate.  This would potentially allow the discontinuation of anticoagulation.  His CHADS-VASc score is 1. In concordance with [place] guidelines, we will discontinue aspirin which is no longer part of the atrial fibrillation guidelines. Most powerful CHADS-VASc point is age and so I think it is reasonable to continue his ELIQUIS as started by Dr. Farrel Conners    I have no data evaluating  the use of dronaderone at 200 mg twice daily; given the absence of symptoms, I think it is reasonable to discontinue antiarrhythmic therapy     Virl Axe

## 2016-01-30 ENCOUNTER — Telehealth: Payer: Self-pay | Admitting: Internal Medicine

## 2016-01-30 NOTE — Telephone Encounter (Signed)
Patient brought in flash drive with note re: ekg's  Placed in nurse box.

## 2016-01-31 NOTE — Telephone Encounter (Signed)
Alvis Lemmings, RN will be in the [place] office Thursday and will give to her at that time.

## 2016-02-04 ENCOUNTER — Encounter: Payer: Self-pay | Admitting: Internal Medicine

## 2016-02-08 ENCOUNTER — Encounter: Payer: Self-pay | Admitting: Internal Medicine

## 2016-02-08 NOTE — Progress Notes (Unsigned)
spke with pt following review of his old records on a thumb drive.   The ECG 2012 April demonstrated atrial fib.  Hence we will continue on current therapy  If his symptoms dictate wiould refer for ablation   He would like to be seen prn

## 2016-03-01 ENCOUNTER — Telehealth: Payer: Self-pay | Admitting: Cardiology

## 2016-03-01 DIAGNOSIS — I480 Paroxysmal atrial fibrillation: Secondary | ICD-10-CM

## 2016-03-01 NOTE — Telephone Encounter (Signed)
Pt needs a monitor and then we can have him come in to see me or AI  Would prob try on flec or propafenone

## 2016-03-01 NOTE — Telephone Encounter (Signed)
Spoke with patient and he states that he has noticed problems since being off the multaq. He reports increased irregular rhythm and fast heart rates with minimal exertion. Let him know that I would forward Dr. [name] regarding this and see if he has any recommendations. He was agreeable with plan and had no further questions at this time.

## 2016-03-01 NOTE — Telephone Encounter (Signed)
Received mychart request for ingal appt.   Per Msg:  I went completely off Multaq after the visit on Nov 18 with Dr [name], and previously had been on a reduced regimen at your direction. Since then I am experiencing irregular heart beats and regularly hit 120-160 with light to moderate exertion. I hit 120 after stripping sheets from the bed yesterday and today hit 160 after raking leaves for 30 minutes. I'm kind of reluctant to try any heavy exertion for fear of what that might bring. When my heart rate is elevated, I have not experienced any real heart attack sensation other than a very slight sense of chest pressure or feeling like I needed to breath deeper.  No appt available in requested time frame

## 2016-03-06 ENCOUNTER — Ambulatory Visit (INDEPENDENT_AMBULATORY_CARE_PROVIDER_SITE_OTHER): Payer: Medicare Other

## 2016-03-06 DIAGNOSIS — I480 Paroxysmal atrial fibrillation: Secondary | ICD-10-CM

## 2016-03-06 NOTE — Telephone Encounter (Signed)
Spoke with [name] then called patient to have him come to office today to apply zio monitor.

## 2016-03-06 NOTE — Telephone Encounter (Signed)
Order placed for a 14 day ZIO patch- the patient will then need an appt to see Dr. [name] after the monitor is worn. Meriam Sprague aware and will set up patient for the monitor.

## 2016-03-14 ENCOUNTER — Encounter: Payer: Self-pay | Admitting: Primary Care

## 2016-03-16 ENCOUNTER — Other Ambulatory Visit: Payer: Self-pay | Admitting: Primary Care

## 2016-03-16 DIAGNOSIS — N400 Enlarged prostate without lower urinary tract symptoms: Secondary | ICD-10-CM

## 2016-03-16 DIAGNOSIS — I480 Paroxysmal atrial fibrillation: Secondary | ICD-10-CM

## 2016-03-16 DIAGNOSIS — Z13220 Encounter for screening for lipoid disorders: Secondary | ICD-10-CM

## 2016-03-19 DIAGNOSIS — Z85828 Personal history of other malignant neoplasm of skin: Secondary | ICD-10-CM

## 2016-03-19 HISTORY — DX: Personal history of other malignant neoplasm of skin: Z85.828

## 2016-03-19 HISTORY — PX: MOHS SURGERY: SUR867

## 2016-03-20 ENCOUNTER — Other Ambulatory Visit (INDEPENDENT_AMBULATORY_CARE_PROVIDER_SITE_OTHER): Payer: Medicare Other

## 2016-03-20 ENCOUNTER — Encounter: Payer: Self-pay | Admitting: Internal Medicine

## 2016-03-20 DIAGNOSIS — N400 Enlarged prostate without lower urinary tract symptoms: Secondary | ICD-10-CM

## 2016-03-20 DIAGNOSIS — Z1159 Encounter for screening for other viral diseases: Secondary | ICD-10-CM

## 2016-03-20 LAB — BASIC METABOLIC PANEL
BUN: 24 mg/dL — AB (ref 6–23)
CHLORIDE: 105 meq/L (ref 96–112)
CO2: 32 mEq/L (ref 19–32)
Calcium: 9 mg/dL (ref 8.4–10.5)
Creatinine, Ser: 1.13 mg/dL (ref 0.40–1.50)
GFR: 69.17 mL/min (ref >60.00)
Glucose, Bld: 96 mg/dL (ref 70–99)
POTASSIUM: 4.1 meq/L (ref 3.5–5.1)
Sodium: 141 mEq/L (ref 135–145)

## 2016-03-20 LAB — PSA: PSA: 2.86 ng/mL (ref 0.10–4.00)

## 2016-03-21 LAB — HEPATITIS C ANTIBODY: HCV AB: NEGATIVE

## 2016-03-22 ENCOUNTER — Ambulatory Visit: Payer: Medicare Other | Admitting: Internal Medicine

## 2016-03-23 ENCOUNTER — Encounter: Payer: Self-pay | Admitting: Primary Care

## 2016-03-23 ENCOUNTER — Ambulatory Visit (INDEPENDENT_AMBULATORY_CARE_PROVIDER_SITE_OTHER): Payer: Medicare Other | Admitting: Primary Care

## 2016-03-23 VITALS — BP 136/70 | HR 49 | Temp 97.7°F | Ht 70.0 in | Wt 173.8 lb

## 2016-03-23 DIAGNOSIS — L989 Disorder of the skin and subcutaneous tissue, unspecified: Secondary | ICD-10-CM | POA: Diagnosis not present

## 2016-03-23 DIAGNOSIS — I480 Paroxysmal atrial fibrillation: Secondary | ICD-10-CM | POA: Diagnosis not present

## 2016-03-23 DIAGNOSIS — Z0000 Encounter for general adult medical examination without abnormal findings: Secondary | ICD-10-CM | POA: Diagnosis not present

## 2016-03-23 DIAGNOSIS — Z23 Encounter for immunization: Secondary | ICD-10-CM

## 2016-03-23 NOTE — Assessment & Plan Note (Signed)
Prevnar 13 due today. Zostavax, influenza, tetanus UTD. Colonoscopy UTD. PSA normal. Exam unremarkable, labs unremarkable. He leads a healthy lifestyle with diet and exercise. Discussed to increase exercise, limit sweets. Advance directives completed, he will provide a copy. All recommendations provided at the end of today's visit.  I have personally reviewed and have noted: 1. The patient's medical and social history 2. Their use of alcohol, tobacco or illicit drugs 3. Their current medications and supplements 4. The patient's functional ability including ADL's, fall  risks, home safety risks and hearing or visual  impairment. 5. Diet and physical activities 6. Evidence for depression or mood disorder

## 2016-03-23 NOTE — Assessment & Plan Note (Signed)
Stable. Recent PSA normal.

## 2016-03-23 NOTE — Addendum Note (Signed)
Addended by: Jacqualin Combes on: 03/23/2016 12:32 PM   Modules accepted: Orders

## 2016-03-23 NOTE — Assessment & Plan Note (Signed)
Regular rate and rhythm today. Due for follow up in 1-2 weeks with Cardiology. Continue Eliquis.

## 2016-03-23 NOTE — Patient Instructions (Addendum)
You will be contacted regarding your referral to Dermatology.  Please let us know if you have not heard back within one week.   You were provided with a pneumonia vaccination today.  Continue your efforts towards a healthy lifestyle with diet and exercise.  Ensure you are consuming 64 ounces of water daily.  Regular exercise will help to reduce arthritic pain.  Follow up in 1 year for annual exam or sooner if needed.  It was a pleasure to see you today!

## 2016-03-23 NOTE — Progress Notes (Signed)
Pre visit review using our clinic review tool, if applicable. No additional management support is needed unless otherwise documented below in the visit note. 

## 2016-03-23 NOTE — Progress Notes (Addendum)
Patient ID: Brian Glass, male   DOB: September 03, 1950, 66 y.o.   MRN: [id number]  HPI: Brian Glass is a 66 year old male who presents today for his Welcome to Medicare visit.  Past Medical History:  Diagnosis Date  . Allergy   . Arthritis   . Atrial fibrillation ([place])   . Bilateral vitreous detachment   . Chickenpox     Current Outpatient Prescriptions  Medication Sig Dispense Refill  . apixaban (ELIQUIS) 5 MG TABS tablet Take 1 tablet (5 mg total) by mouth 2 (two) times daily. 180 tablet 3  . Calcium Polycarbophil (EQ FIBER LAXATIVE PO) Take 1 capsule by mouth 2 (two) times daily.    . fluticasone (FLONASE) 50 MCG/ACT nasal spray Place 1 spray into both nostrils daily.    . Multiple Vitamins-Minerals (SENIOR MULTIVITAMIN PLUS PO) Take 1 tablet by mouth daily.    Marland Kitchen olopatadine (PATANOL) 0.1 % ophthalmic solution Place 1 drop into both eyes 2 (two) times daily. 5 mL 1  . Omega-3 Fatty Acids (FISH OIL) 1200 MG CAPS Take 1 capsule by mouth daily.    . tamsulosin (FLOMAX) 0.4 MG CAPS capsule Take 0.4 mg by mouth daily.     No current facility-administered medications for this visit.     Allergies  Allergen Reactions  . Sudafed [Pseudoephedrine Hcl]     Family History  Problem Relation Age of Onset  . Hyperlipidemia Mother   . Heart disease Mother   . Hypertension Mother   . Hyperlipidemia Father   . Heart disease Father   . Stroke Father   . Hypertension Father   . Heart attack Father   . Heart disease Brother   . Prostate cancer Brother   . Atrial fibrillation Brother   . Sleep apnea Brother     Social History   Social History  . Marital status: Married    Spouse name: N/A  . Number of children: N/A  . Years of education: N/A   Occupational History  . Not on file.   Social History Main Topics  . Smoking status: Never Smoker  . Smokeless tobacco: Never Used  . Alcohol use Yes     Comment: social  . Drug use: No  . Sexual activity: Not on file   Other Topics  Concern  . Not on file   Social History Narrative   Married.   3 children. 2 grandchildren.   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    Hospitiliaztions: None  Health Maintenance:    Flu: completed in September 2017  Tetanus: Completed in 2013  Pneumovax: Completed in 2013  Prevnar: Due, never completed.  Zostavax: Completed in 2009  Colonoscopy: Completed in 2010, due in 2020  Eye Doctor: Completed in August 2017  Dental Exam: Completes semi-annually.  PSA: Completed in 2018, normal.    Providers: Dr. [name], Cardiology; Alma Friendly, PCP   I have personally reviewed and have noted: 1. The patient's medical and social history 2. Their use of alcohol, tobacco or illicit drugs 3. Their current medications and supplements 4. The patient's functional ability including ADL's, fall risks, home safety risks  and hearing or visual impairment. 5. Diet and physical activities 6. Evidence for depression or mood disorder  Subjective:   Review of Systems:   Constitutional: Denies fever, malaise, fatigue, headache or abrupt weight changes.  HEENT: Denies eye pain, eye redness, ear pain, ringing in the ears, wax buildup, runny nose, nasal congestion, bloody nose,  or sore throat. Respiratory: Denies difficulty breathing, cough or sputum production.   Cardiovascular: Denies chest pain, chest tightness, palpitations or swelling in the hands or feet. Brian Glass has been wearing a Holter Monitor for the past 2 weeks. Brian Glass did notice some tachycardia. Brian Glass has follow up with cardiology next week. Gastrointestinal: Denies abdominal pain, bloating, constipation, diarrhea or blood in the stool.  GU: Denies urgency, frequency, pain with urination, burning sensation, blood in urine, odor or discharge. Musculoskeletal: Denies decrease in range of motion, difficulty with gait, muscle pain. Brian Glass does experience hip, lower, and lower extremity pain.   Skin: Denies redness, rashes. Brian Glass has  noticed a lesion for the past 1 year, changes in color, now crusting. No injury. Neurological: Denies dizziness, difficulty with memory, difficulty with speech or problems with balance and coordination.   No other specific complaints in a complete review of systems (except as listed in HPI above).  Objective:  PE:   BP 136/70   Pulse (!) 49   Temp 97.7 F (36.5 C) (Oral)   Ht 5\' 10"  (1.778 m)   Wt 173 lb 12.8 oz (78.8 kg)   SpO2 99%   BMI 24.94 kg/m  Wt Readings from Last 3 Encounters:  03/23/16 173 lb 12.8 oz (78.8 kg)  01/25/16 169 lb 9.6 oz (76.9 kg)  01/04/16 171 lb (77.6 kg)    General: Appears their stated age, well developed, well nourished in NAD. Skin: Warm, dry and intact. No rashes. Small lesion noted to left cheek, crusting, flesh colored. HEENT: Head: normal shape and size; Eyes: sclera white, no icterus, conjunctiva pink, PERRLA and EOMs intact; Ears: Tm's gray and intact, normal light reflex; Nose: mucosa pink and moist, septum midline; Throat/Mouth: Teeth present, mucosa pink and moist, no exudate, lesions or ulcerations noted.  Neck: Normal range of motion. Neck supple, trachea midline. No massses, lumps or thyromegaly present.  Cardiovascular: Normal rate and rhythm. S1,S2 noted.  No murmur, rubs or gallops noted. No JVD or BLE edema. No carotid bruits noted. Pulmonary/Chest: Normal effort and positive vesicular breath sounds. No respiratory distress. No wheezes, rales or ronchi noted.  Abdomen: Soft and nontender. Normal bowel sounds, no bruits noted. No distention or masses noted. Liver, spleen and kidneys non palpable. Musculoskeletal: Normal range of motion. No signs of joint swelling. No difficulty with gait.  Neurological: Alert and oriented. Cranial nerves II-XII intact. Coordination normal. +DTRs bilaterally. Psychiatric: Mood and affect normal. Behavior is normal. Judgment and thought content normal.   EKG:  BMET    Component Value Date/Time   NA 141  03/20/2016 0751   K 4.1 03/20/2016 0751   CL 105 03/20/2016 0751   CO2 32 03/20/2016 0751   GLUCOSE 96 03/20/2016 0751   BUN 24 (H) 03/20/2016 0751   CREATININE 1.13 03/20/2016 0751   CALCIUM 9.0 03/20/2016 0751   GFRNONAA >60 01/05/2016 0938   GFRAA >60 01/05/2016 0938    Lipid Panel  No results found for: CHOL, TRIG, HDL, CHOLHDL, VLDL, LDLCALC  CBC No results found for: WBC, RBC, HGB, HCT, PLT, MCV, MCH, MCHC, RDW, LYMPHSABS, MONOABS, EOSABS, BASOSABS  Hgb A1C No results found for: HGBA1C    Assessment and Plan:   Medicare Annual Wellness Visit:  Diet: Brian Glass endorses a healthy diet: Breakfast: Grains Lunch: Sandwich Dinner: Beans, legumes, soups Snacks: Fruit, nuts, popcorn Desserts: Cookies, Ice Cream, several days weekly.  Physical activity: Active, but not regularly exercising. Depression/mood screen: Negative Hearing: Intact to whispered voice Visual acuity: Grossly  normal, performs annual eye exam  ADLs: Capable Fall risk: None Home safety: Good Cognitive evaluation: Intact to orientation, naming, recall and repetition EOL planning: Adv directives completed, will provide a copy, full code.  Preventative Medicine: Prevnar 13 due today. Zostavax, influenza, tetanus UTD. Colonoscopy UTD. PSA normal. Exam unremarkable, labs unremarkable. Brian Glass leads a healthy lifestyle with diet and exercise. Discussed to increase exercise, limit sweets. Advance directives completed, Brian Glass will provide a copy. All recommendations provided at the end of today's visit.  Next appointment: 1 year or sooner if needed.

## 2016-03-27 DIAGNOSIS — I4891 Unspecified atrial fibrillation: Secondary | ICD-10-CM | POA: Diagnosis not present

## 2016-03-29 ENCOUNTER — Encounter: Payer: Self-pay | Admitting: Internal Medicine

## 2016-03-29 ENCOUNTER — Ambulatory Visit (INDEPENDENT_AMBULATORY_CARE_PROVIDER_SITE_OTHER): Payer: Medicare Other | Admitting: Internal Medicine

## 2016-03-29 VITALS — BP 100/60 | HR 55 | Ht 70.0 in | Wt 173.5 lb

## 2016-03-29 DIAGNOSIS — I480 Paroxysmal atrial fibrillation: Secondary | ICD-10-CM | POA: Diagnosis not present

## 2016-03-29 DIAGNOSIS — I4892 Unspecified atrial flutter: Secondary | ICD-10-CM | POA: Diagnosis not present

## 2016-03-29 NOTE — Patient Instructions (Signed)
Medication Instructions: - Your physician recommends that you continue on your current medications as directed. Please refer to the Current Medication list given to you today.  Labwork: - none ordered  Procedures/Testing: - none ordered  Follow-Up: - You have been referred to : Dr. [name] to discuss an atrial fib ablation.  (Dr. Jackalyn Lombard scheduler, Lenna Sciara, will call you to set this up)    Any Additional Special Instructions Will Be Listed Below (If Applicable).     If you need a refill on your cardiac medications before your next appointment, please call your pharmacy.

## 2016-03-29 NOTE — Progress Notes (Signed)
Patient Care Team: Pleas Koch, NP as PCP - General (Internal Medicine)   HPI  Brian Glass is a 66 y.o. male Seen in follow-up for atrial fibrillation and atrial flutter. He has been treated with apixoban for a CHADS-VASc score of 1 (age)  Review of remote tracings had confirmed the presence of atrial fibrillation.  When seen 11/17, he was asymptomatic and his dronaderone was discontinued. It was 200 mg twice daily. Shortly thereafter, he complained of tachycardia with activity  He had noted previously with dronaderone that orthostatic hypotension was problematic. This improved with his discontinuation not withstanding the continued therapy with Flomax  Records and Results Reviewed ZIO >> afib with RVR  Past Medical History:  Diagnosis Date  . Allergy   . Arthritis   . Atrial fibrillation ([place])   . Bilateral vitreous detachment   . Chickenpox     Past Surgical History:  Procedure Laterality Date  . CATARACT EXTRACTION, BILATERAL  2016  . TONSILLECTOMY  1955    Current Outpatient Prescriptions  Medication Sig Dispense Refill  . apixaban (ELIQUIS) 5 MG TABS tablet Take 1 tablet (5 mg total) by mouth 2 (two) times daily. 180 tablet 3  . Calcium Polycarbophil (EQ FIBER LAXATIVE PO) Take 1 capsule by mouth 2 (two) times daily.    . fluticasone (FLONASE) 50 MCG/ACT nasal spray Place 1 spray into both nostrils daily.    . Multiple Vitamins-Minerals (SENIOR MULTIVITAMIN PLUS PO) Take 1 tablet by mouth daily.    Marland Kitchen olopatadine (PATANOL) 0.1 % ophthalmic solution Place 1 drop into both eyes 2 (two) times daily. 5 mL 1  . Omega-3 Fatty Acids (FISH OIL) 1200 MG CAPS Take 1 capsule by mouth daily.    . tamsulosin (FLOMAX) 0.4 MG CAPS capsule Take 0.4 mg by mouth daily.     No current facility-administered medications for this visit.     Allergies  Allergen Reactions  . Sudafed [Pseudoephedrine Hcl]       Review of Systems negative except from HPI and  PMH  Physical Exam BP 100/60 (BP Location: Left Arm, Patient Position: Sitting, Cuff Size: Normal)   Pulse (!) 55   Ht 5\' 10"  (1.778 m)   Wt 173 lb 8 oz (78.7 kg)   BMI 24.89 kg/m  Well developed and well nourished in no acute distress HENT normal E scleral and icterus clear Neck Supple JVP flat; carotids brisk and full Clear to ausculation  Regular rate and rhythm, no murmurs gallops or rub Soft with active bowel sounds No clubbing cyanosis  Edema Alert and oriented, grossly normal motor and sensory function Skin Warm and Dry  ECG demonstrates sinus rhythm at 55 Intervals 20/10/44 Otherwise normal  Assessment and  Plan  Atrial flutter  Atrial fibrillation  Orthostatic hypotension   sinus bradycardia  The patient has had recurrent atrial fibrillation documented now on his ZIO monitor. It is associated with tachypalpitations and discombobulated. Rates are fast in the 140-180 range. Burden is about 8-10% per day  He has resting bradycardia. This was aggravated by dronaderone. This obviously makes difficult using propafenone or flecainide requires adjunctive AV nodal blockade. His hypotension further makes this difficult.   Dofetilide required 3 days of hospitalization to which he is disinclined   alternatively, catheter ablation becomes an option. It would be a class I option now that he has failed dronaderone. He would like to pursue this. We will arrange up titration with Dr. Greggory Brandy.  We discussed the  data regarding the use of catheter ablation as symptom relief  For now we will consider using low-dose dronaderone at 200 mg twice daily which he had been doing previously. He will let us know if he chooses to do that i.e. if his symptoms dictate   Current medicines are reviewed at length with the patient today .  The patient does not  have concerns regarding medicines.  More than 50% of 45 min was spent in counseling related to the above

## 2016-04-03 DIAGNOSIS — D485 Neoplasm of uncertain behavior of skin: Secondary | ICD-10-CM | POA: Diagnosis not present

## 2016-04-03 DIAGNOSIS — L570 Actinic keratosis: Secondary | ICD-10-CM | POA: Diagnosis not present

## 2016-04-03 DIAGNOSIS — C44319 Basal cell carcinoma of skin of other parts of face: Secondary | ICD-10-CM | POA: Diagnosis not present

## 2016-04-03 DIAGNOSIS — L821 Other seborrheic keratosis: Secondary | ICD-10-CM | POA: Diagnosis not present

## 2016-04-13 ENCOUNTER — Encounter: Payer: Self-pay | Admitting: Internal Medicine

## 2016-04-13 ENCOUNTER — Ambulatory Visit (INDEPENDENT_AMBULATORY_CARE_PROVIDER_SITE_OTHER): Payer: Medicare Other | Admitting: Internal Medicine

## 2016-04-13 ENCOUNTER — Telehealth: Payer: Self-pay | Admitting: Internal Medicine

## 2016-04-13 VITALS — BP 112/68 | HR 60 | Ht 70.0 in | Wt 175.6 lb

## 2016-04-13 DIAGNOSIS — R001 Bradycardia, unspecified: Secondary | ICD-10-CM

## 2016-04-13 DIAGNOSIS — I4892 Unspecified atrial flutter: Secondary | ICD-10-CM | POA: Diagnosis not present

## 2016-04-13 DIAGNOSIS — I480 Paroxysmal atrial fibrillation: Secondary | ICD-10-CM

## 2016-04-13 NOTE — Telephone Encounter (Signed)
New message       Calling to schedule procedure.  Ok to schedule first available.

## 2016-04-13 NOTE — Patient Instructions (Signed)
Medication Instructions:  Your physician recommends that you continue on your current medications as directed. Please refer to the Current Medication list given to you today.   Labwork: Your physician recommends that you return for lab work ----   Testing/Procedures: Your physician has requested that you have cardiac CT. Cardiac computed tomography (CT) is a painless test that uses an x-ray machine to take clear, detailed pictures of your heart. For further information please visit [facility]. Please follow instruction sheet as given.---week prior to the ablation   Your physician has recommended that you have an ablation. Catheter ablation is a medical procedure used to treat some cardiac arrhythmias (irregular heartbeats). During catheter ablation, a long, thin, flexible tube is put into a blood vessel in your groin (upper thigh), or neck. This tube is called an ablation catheter. It is then guided to your heart through the blood vessel. Radio frequency waves destroy small areas of heart tissue where abnormal heartbeats may cause an arrhythmia to start. Please see the instruction sheet given to you today.  Call back and let me know when to schedule procedure---Timothee Gali Madilyn Fireman, RN    Follow-Up: Your physician recommends that you schedule a follow-up appointment in: 4 weeks post ablation in afib clinic and 3 months post ablation with Dr Rayann Heman   Any Other Special Instructions Will Be Listed Below (If Applicable).     If you need a refill on your cardiac medications before your next appointment, please call your pharmacy.

## 2016-04-16 ENCOUNTER — Encounter: Payer: Self-pay | Admitting: Internal Medicine

## 2016-04-16 NOTE — Telephone Encounter (Signed)
Sent patient a message via my chart in regards to scheduling his procedure on 05/10/16

## 2016-04-16 NOTE — Progress Notes (Signed)
Electrophysiology Office Note   Date:  04/16/2016   ID:  Brian Glass, DOB 21-Aug-1950, MRN [id number]  PCP:  [name], NP   Primary Electrophysiologist: Dr [name]  Chief Complaint  Patient presents with  . Advice Only    PAF/Discuss afib ablation     History of Present Illness: Brian Glass is a 66 y.o. male who presents today for electrophysiology evaluation.   The patient has both atrial fibrillation and atrial flutter.  He has had afib for quite some time.  He was treated initially with multaq with success.  Unfortunately, his afib has progressed.  He reports symptoms of decreased exercise tolerance and fatigue.  Medical therapy has been limited by bradycardia and hypotension.  Today, he denies symptoms of palpitations, chest pain, shortness of breath, orthopnea, PND, lower extremity edema, claudication, dizziness, presyncope, syncope, bleeding, or neurologic sequela. The patient is tolerating medications without difficulties and is otherwise without complaint today.    Past Medical History:  Diagnosis Date  . Allergy   . Arthritis   . Atrial fibrillation ([place])   . Bilateral vitreous detachment   . Chickenpox    Past Surgical History:  Procedure Laterality Date  . CATARACT EXTRACTION, BILATERAL  2016  . TONSILLECTOMY  1955     Current Outpatient Prescriptions  Medication Sig Dispense Refill  . apixaban (ELIQUIS) 5 MG TABS tablet Take 1 tablet (5 mg total) by mouth 2 (two) times daily. 180 tablet 3  . Calcium Polycarbophil (EQ FIBER LAXATIVE PO) Take 1 capsule by mouth 2 (two) times daily.    . fluticasone (FLONASE) 50 MCG/ACT nasal spray Place 1 spray into both nostrils daily.    . Multiple Vitamins-Minerals (SENIOR MULTIVITAMIN PLUS PO) Take 1 tablet by mouth daily.    Marland Kitchen olopatadine (PATANOL) 0.1 % ophthalmic solution Place 1 drop into both eyes 2 (two) times daily. 5 mL 1  . Omega-3 Fatty Acids (FISH OIL) 1200 MG CAPS Take 1 capsule by mouth daily.     . tamsulosin (FLOMAX) 0.4 MG CAPS capsule Take 0.4 mg by mouth daily.     No current facility-administered medications for this visit.     Allergies:   Sudafed [pseudoephedrine hcl]   Social History:  The patient  reports that he has never smoked. He has never used smokeless tobacco. He reports that he drinks alcohol. He reports that he does not use drugs.   Family History:  The patient's family history includes Atrial fibrillation in his brother; Heart attack in his father; Heart disease in his brother, father, and mother; Hyperlipidemia in his father and mother; Hypertension in his father and mother; Prostate cancer in his brother; Sleep apnea in his brother; Stroke in his father.    ROS:  Please see the history of present illness.   All other systems are reviewed and negative.    PHYSICAL EXAM: VS:  BP 112/68   Pulse 60   Ht 5\' 10"  (1.778 m)   Wt 175 lb 9.6 oz (79.7 kg)   BMI 25.20 kg/m  , BMI Body mass index is 25.2 kg/m. GEN: Well nourished, well developed, in no acute distress  HEENT: normal  Neck: no JVD, carotid bruits, or masses Cardiac: RRR; no murmurs, rubs, or gallops,no edema  Respiratory:  clear to auscultation bilaterally, normal work of breathing GI: soft, nontender, nondistended, + BS MS: no deformity or atrophy  Skin: warm and dry  Neuro:  Strength and sensation are intact Psych: euthymic mood, full affect  Recent Labs: 01/05/2016: ALT 22 03/20/2016: BUN 24; Creatinine, Ser 1.13; Potassium 4.1; Sodium 141    Lipid Panel  No results found for: CHOL, TRIG, HDL, CHOLHDL, VLDL, LDLCALC, LDLDIRECT   Wt Readings from Last 3 Encounters:  04/13/16 175 lb 9.6 oz (79.7 kg)  03/29/16 173 lb 8 oz (78.7 kg)  03/23/16 173 lb 12.8 oz (78.8 kg)      Other studies Reviewed: Additional studies/ records that were reviewed today include: Dr Aquilla Hacker, nots, prior echo  Review of the above records today demonstrates: as above   ASSESSMENT AND PLAN:  1.   Paroxysmal atrial fibrillation and atrial flutter The patient has symptomatic atrial arrhythmias.  He has failed medical therapy with mulatq.  Given bradycardia, his AAD options are limited.  We did discuss tikosyn as an option.  Therapeutic strategies for afib and atrial flutter including medicine and ablation were discussed in detail with the patient today. Risk, benefits, and alternatives to EP study and radiofrequency ablation were also discussed in detail today. These risks include but are not limited to stroke, bleeding, vascular damage, tamponade, perforation, damage to the esophagus, lungs, and other structures, pulmonary vein stenosis, worsening renal function, and death. The patient understands these risk and wishes to proceed.  We will therefore proceed with catheter ablation at the next available time. Will obtain cardiac CT prior to ablation to assess LAA for thrombus.   Current medicines are reviewed at length with the patient today.   The patient does not have concerns regarding his medicines.  The following changes were made today:  none    Signed, [name], MD    [place] [address] [phone] (office) [phone] (fax)

## 2016-04-17 NOTE — Telephone Encounter (Signed)
Labs 04/24/16 at 830 Cardiac CT Week of 2/12 afib ablation 2/22 at 7:30 Patient aware to be at the hospital at 5:30am NPO after midnight  No medications the morning of the procedure

## 2016-04-19 ENCOUNTER — Encounter: Payer: Self-pay | Admitting: Internal Medicine

## 2016-04-23 ENCOUNTER — Encounter: Payer: Self-pay | Admitting: Internal Medicine

## 2016-04-24 ENCOUNTER — Encounter: Payer: Self-pay | Admitting: Primary Care

## 2016-04-24 ENCOUNTER — Other Ambulatory Visit: Payer: Medicare Other | Admitting: *Deleted

## 2016-04-24 DIAGNOSIS — I480 Paroxysmal atrial fibrillation: Secondary | ICD-10-CM

## 2016-04-24 LAB — CBC WITH DIFFERENTIAL/PLATELET
BASOS ABS: 0 10*3/uL (ref 0.0–0.2)
Basos: 1 %
EOS (ABSOLUTE): 0.1 10*3/uL (ref 0.0–0.4)
Eos: 3 %
HEMOGLOBIN: 13.8 g/dL (ref 13.0–17.7)
Hematocrit: 38.2 % (ref 37.5–51.0)
Immature Grans (Abs): 0 10*3/uL (ref 0.0–0.1)
Immature Granulocytes: 0 %
LYMPHS ABS: 1.4 10*3/uL (ref 0.7–3.1)
Lymphs: 33 %
MCH: 30.9 pg (ref 26.6–33.0)
MCHC: 36.1 g/dL — AB (ref 31.5–35.7)
MCV: 86 fL (ref 79–97)
MONOS ABS: 0.4 10*3/uL (ref 0.1–0.9)
Monocytes: 9 %
NEUTROS ABS: 2.5 10*3/uL (ref 1.4–7.0)
Neutrophils: 54 %
PLATELETS: 128 10*3/uL — AB (ref 150–379)
RBC: 4.46 x10E6/uL (ref 4.14–5.80)
RDW: 13.1 % (ref 12.3–15.4)
WBC: 4.4 10*3/uL (ref 3.4–10.8)

## 2016-04-24 LAB — BASIC METABOLIC PANEL
BUN / CREAT RATIO: 17 (ref 10–24)
BUN: 20 mg/dL (ref 8–27)
CHLORIDE: 104 mmol/L (ref 96–106)
CO2: 25 mmol/L (ref 18–29)
Calcium: 9.2 mg/dL (ref 8.6–10.2)
Creatinine, Ser: 1.15 mg/dL (ref 0.76–1.27)
GFR calc Af Amer: 77 mL/min/{1.73_m2} (ref >59)
GFR calc non Af Amer: 66 mL/min/{1.73_m2} (ref >59)
Glucose: 116 mg/dL — ABNORMAL HIGH (ref 65–99)
POTASSIUM: 4.3 mmol/L (ref 3.5–5.2)
SODIUM: 143 mmol/L (ref 134–144)

## 2016-05-07 ENCOUNTER — Ambulatory Visit (HOSPITAL_COMMUNITY)
Admission: RE | Admit: 2016-05-07 | Discharge: 2016-05-07 | Disposition: A | Discharge status: Home / Self Care | Payer: Medicare Other | Source: Ambulatory Visit | Attending: Internal Medicine | Admitting: Internal Medicine

## 2016-05-07 ENCOUNTER — Encounter (HOSPITAL_COMMUNITY): Payer: Self-pay

## 2016-05-07 DIAGNOSIS — I480 Paroxysmal atrial fibrillation: Secondary | ICD-10-CM

## 2016-05-07 DIAGNOSIS — I4891 Unspecified atrial fibrillation: Secondary | ICD-10-CM | POA: Diagnosis not present

## 2016-05-07 MED ORDER — IOPAMIDOL (ISOVUE-370) INJECTION 76%
INTRAVENOUS | Status: AC
  Administered 2016-05-07: 80 mL
  Filled 2016-05-07: qty 100

## 2016-05-09 NOTE — Anesthesia Preprocedure Evaluation (Addendum)
Anesthesia Evaluation  Patient identified by MRN, date of birth, ID band Patient awake    Reviewed: Allergy & Precautions, H&P , NPO status , Patient's Chart, lab work & pertinent test results  Airway Mallampati: I  TM Distance: >3 FB Neck ROM: Full    Dental no notable dental hx. (+) Teeth Intact, Dental Advisory Given   Pulmonary neg pulmonary ROS,    Pulmonary exam normal breath sounds clear to auscultation       Cardiovascular Exercise Tolerance: Good + dysrhythmias Atrial Fibrillation  Rhythm:Irregular Rate:Normal     Neuro/Psych negative neurological ROS  negative psych ROS   GI/Hepatic negative GI ROS, Neg liver ROS,   Endo/Other  negative endocrine ROS  Renal/GU negative Renal ROS  negative genitourinary   Musculoskeletal  (+) Arthritis , Osteoarthritis,    Abdominal   Peds  Hematology negative hematology ROS (+)   Anesthesia Other Findings   Reproductive/Obstetrics negative OB ROS                            Anesthesia Physical Anesthesia Plan  ASA: III  Anesthesia Plan: MAC   Post-op Pain Management:    Induction: Intravenous  Airway Management Planned: Simple Face Mask  Additional Equipment:   Intra-op Plan:   Post-operative Plan:   Informed Consent: I have reviewed the patients History and Physical, chart, labs and discussed the procedure including the risks, benefits and alternatives for the proposed anesthesia with the patient or authorized representative who has indicated his/her understanding and acceptance.   Dental advisory given  Plan Discussed with: CRNA  Anesthesia Plan Comments:         Anesthesia Quick Evaluation

## 2016-05-10 ENCOUNTER — Encounter (HOSPITAL_COMMUNITY): Payer: Self-pay | Admitting: Certified Registered Nurse Anesthetist

## 2016-05-10 ENCOUNTER — Encounter (HOSPITAL_COMMUNITY)
Admission: RE | Disposition: A | Discharge status: Home / Self Care | Payer: Self-pay | Source: Ambulatory Visit | Attending: Internal Medicine

## 2016-05-10 ENCOUNTER — Ambulatory Visit (HOSPITAL_COMMUNITY): Payer: Medicare Other | Admitting: Certified Registered Nurse Anesthetist

## 2016-05-10 ENCOUNTER — Ambulatory Visit (HOSPITAL_COMMUNITY)
Admission: RE | Admit: 2016-05-10 | Discharge: 2016-05-11 | Disposition: A | Discharge status: Home / Self Care | Payer: Medicare Other | Source: Ambulatory Visit | Attending: Internal Medicine | Admitting: Internal Medicine

## 2016-05-10 DIAGNOSIS — Z8249 Family history of ischemic heart disease and other diseases of the circulatory system: Secondary | ICD-10-CM | POA: Diagnosis not present

## 2016-05-10 DIAGNOSIS — M1990 Unspecified osteoarthritis, unspecified site: Secondary | ICD-10-CM | POA: Insufficient documentation

## 2016-05-10 DIAGNOSIS — I959 Hypotension, unspecified: Secondary | ICD-10-CM | POA: Insufficient documentation

## 2016-05-10 DIAGNOSIS — I4891 Unspecified atrial fibrillation: Secondary | ICD-10-CM | POA: Diagnosis present

## 2016-05-10 DIAGNOSIS — Z7901 Long term (current) use of anticoagulants: Secondary | ICD-10-CM | POA: Diagnosis not present

## 2016-05-10 DIAGNOSIS — I483 Typical atrial flutter: Secondary | ICD-10-CM | POA: Diagnosis not present

## 2016-05-10 DIAGNOSIS — Z7951 Long term (current) use of inhaled steroids: Secondary | ICD-10-CM | POA: Diagnosis not present

## 2016-05-10 DIAGNOSIS — Z823 Family history of stroke: Secondary | ICD-10-CM | POA: Diagnosis not present

## 2016-05-10 DIAGNOSIS — I4892 Unspecified atrial flutter: Secondary | ICD-10-CM | POA: Insufficient documentation

## 2016-05-10 DIAGNOSIS — I480 Paroxysmal atrial fibrillation: Secondary | ICD-10-CM | POA: Diagnosis not present

## 2016-05-10 HISTORY — PX: ATRIAL FIBRILLATION ABLATION: EP1191

## 2016-05-10 LAB — BASIC METABOLIC PANEL
Anion gap: 9 (ref 5–15)
BUN: 21 mg/dL — ABNORMAL HIGH (ref 6–20)
CALCIUM: 9.4 mg/dL (ref 8.9–10.3)
CO2: 26 mmol/L (ref 22–32)
Chloride: 106 mmol/L (ref 101–111)
Creatinine, Ser: 1.17 mg/dL (ref 0.61–1.24)
GFR calc Af Amer: >60 mL/min (ref >60)
GLUCOSE: 99 mg/dL (ref 65–99)
Potassium: 4.1 mmol/L (ref 3.5–5.1)
Sodium: 141 mmol/L (ref 135–145)

## 2016-05-10 LAB — CBC
HEMATOCRIT: 39.5 % (ref 39.0–52.0)
HEMOGLOBIN: 14.3 g/dL (ref 13.0–17.0)
MCH: 31.1 pg (ref 26.0–34.0)
MCHC: 36.2 g/dL — AB (ref 30.0–36.0)
MCV: 85.9 fL (ref 78.0–100.0)
Platelets: 131 10*3/uL — ABNORMAL LOW (ref 150–400)
RBC: 4.6 MIL/uL (ref 4.22–5.81)
RDW: 12.5 % (ref 11.5–15.5)
WBC: 4.3 10*3/uL (ref 4.0–10.5)

## 2016-05-10 LAB — POCT ACTIVATED CLOTTING TIME
ACTIVATED CLOTTING TIME: 318 s
Activated Clotting Time: 301 seconds
Activated Clotting Time: 340 seconds
Activated Clotting Time: 180 seconds

## 2016-05-10 SURGERY — ATRIAL FIBRILLATION ABLATION
Anesthesia: Monitor Anesthesia Care

## 2016-05-10 MED ORDER — ACETAMINOPHEN 325 MG PO TABS
650.0000 mg | ORAL_TABLET | ORAL | Status: DC | PRN
  Administered 2016-05-11: 650 mg via ORAL
  Filled 2016-05-10: qty 2

## 2016-05-10 MED ORDER — ISOPROTERENOL HCL 0.2 MG/ML IJ SOLN
INTRAMUSCULAR | Status: DC | PRN
  Administered 2016-05-10: 10 ug/min via INTRAVENOUS

## 2016-05-10 MED ORDER — LIDOCAINE 2% (20 MG/ML) 5 ML SYRINGE
INTRAMUSCULAR | Status: DC | PRN
  Administered 2016-05-10: 40 mg via INTRAVENOUS

## 2016-05-10 MED ORDER — IOPAMIDOL (ISOVUE-370) INJECTION 76%
INTRAVENOUS | Status: DC | PRN
  Administered 2016-05-10: 3 mL via INTRAVENOUS

## 2016-05-10 MED ORDER — ISOPROTERENOL HCL 0.2 MG/ML IJ SOLN
INTRAMUSCULAR | Status: AC
  Filled 2016-05-10: qty 5

## 2016-05-10 MED ORDER — BUPIVACAINE HCL (PF) 0.25 % IJ SOLN
INTRAMUSCULAR | Status: AC
  Filled 2016-05-10: qty 30

## 2016-05-10 MED ORDER — MIDAZOLAM HCL 5 MG/5ML IJ SOLN
INTRAMUSCULAR | Status: DC | PRN
  Administered 2016-05-10 (×2): 1 mg via INTRAVENOUS

## 2016-05-10 MED ORDER — HEPARIN SODIUM (PORCINE) 1000 UNIT/ML IJ SOLN
INTRAMUSCULAR | Status: DC | PRN
  Administered 2016-05-10 (×2): 1000 [IU] via INTRAVENOUS

## 2016-05-10 MED ORDER — SODIUM CHLORIDE 0.9 % IV SOLN
250.0000 mL | INTRAVENOUS | Status: DC | PRN
Start: 2016-05-10 — End: 2016-05-11

## 2016-05-10 MED ORDER — BUPIVACAINE HCL (PF) 0.25 % IJ SOLN
INTRAMUSCULAR | Status: DC | PRN
  Administered 2016-05-10: 30 mL

## 2016-05-10 MED ORDER — SODIUM CHLORIDE 0.9 % IV SOLN
INTRAVENOUS | Status: DC
  Administered 2016-05-10: 06:00:00 via INTRAVENOUS

## 2016-05-10 MED ORDER — FENTANYL CITRATE (PF) 100 MCG/2ML IJ SOLN
INTRAMUSCULAR | Status: DC | PRN
  Administered 2016-05-10: 12.5 ug via INTRAVENOUS
  Administered 2016-05-10: 25 ug via INTRAVENOUS
  Administered 2016-05-10 (×3): 12.5 ug via INTRAVENOUS

## 2016-05-10 MED ORDER — HEPARIN SODIUM (PORCINE) 1000 UNIT/ML IJ SOLN
INTRAMUSCULAR | Status: AC
  Filled 2016-05-10: qty 1

## 2016-05-10 MED ORDER — DIPHENHYDRAMINE HCL 50 MG/ML IJ SOLN
INTRAMUSCULAR | Status: AC
  Filled 2016-05-10: qty 1

## 2016-05-10 MED ORDER — HEPARIN SODIUM (PORCINE) 1000 UNIT/ML IJ SOLN
INTRAMUSCULAR | Status: DC | PRN
  Administered 2016-05-10 (×2): 2000 [IU] via INTRAVENOUS
  Administered 2016-05-10: 12000 [IU] via INTRAVENOUS

## 2016-05-10 MED ORDER — APIXABAN 5 MG PO TABS
5.0000 mg | ORAL_TABLET | Freq: Two times a day (BID) | ORAL | Status: DC
  Administered 2016-05-10 – 2016-05-11 (×2): 5 mg via ORAL
  Filled 2016-05-10 (×3): qty 1

## 2016-05-10 MED ORDER — PROPOFOL 500 MG/50ML IV EMUL
INTRAVENOUS | Status: DC | PRN
  Administered 2016-05-10 (×3): via INTRAVENOUS
  Administered 2016-05-10: 50 ug/kg/min via INTRAVENOUS

## 2016-05-10 MED ORDER — SODIUM CHLORIDE 0.9% FLUSH: 3.0000 mL | INTRAVENOUS | Status: DC | PRN

## 2016-05-10 MED ORDER — HYDROMORPHONE HCL 1 MG/ML IJ SOLN: 0.2500 mg | INTRAMUSCULAR | Status: DC | PRN

## 2016-05-10 MED ORDER — SODIUM CHLORIDE 0.9% FLUSH
3.0000 mL | Freq: Two times a day (BID) | INTRAVENOUS | Status: DC
  Administered 2016-05-10 (×2): 3 mL via INTRAVENOUS

## 2016-05-10 MED ORDER — TAMSULOSIN HCL 0.4 MG PO CAPS
0.4000 mg | ORAL_CAPSULE | Freq: Every day | ORAL | Status: DC
  Administered 2016-05-10 – 2016-05-11 (×2): 0.4 mg via ORAL
  Filled 2016-05-10 (×2): qty 1

## 2016-05-10 MED ORDER — PROTAMINE SULFATE 10 MG/ML IV SOLN
INTRAVENOUS | Status: DC | PRN
  Administered 2016-05-10 (×3): 10 mg via INTRAVENOUS

## 2016-05-10 MED ORDER — HYDROCODONE-ACETAMINOPHEN 5-325 MG PO TABS
1.0000 | ORAL_TABLET | ORAL | Status: DC | PRN
  Administered 2016-05-10 (×2): 2 via ORAL
  Filled 2016-05-10 (×2): qty 2

## 2016-05-10 MED ORDER — ONDANSETRON HCL 4 MG/2ML IJ SOLN: 4.0000 mg | Freq: Four times a day (QID) | INTRAMUSCULAR | Status: DC | PRN

## 2016-05-10 MED ORDER — DIPHENHYDRAMINE HCL 50 MG/ML IJ SOLN
INTRAMUSCULAR | Status: DC | PRN
  Administered 2016-05-10: 25 mg via INTRAVENOUS

## 2016-05-10 SURGICAL SUPPLY — 19 items
BAG SNAP BAND KOVER 36X36 (MISCELLANEOUS) ×2 IMPLANT
BLANKET WARM UNDERBOD FULL ACC (MISCELLANEOUS) ×2 IMPLANT
CATH NAVISTAR SMARTTOUCH DF (ABLATOR) ×2 IMPLANT
CATH SOUNDSTAR 3D IMAGING (CATHETERS) ×2 IMPLANT
CATH VARIABLE LASSO NAV 2515 (CATHETERS) ×2 IMPLANT
CATH WEBSTER BI DIR CS D-F CRV (CATHETERS) ×2 IMPLANT
COVER SWIFTLINK CONNECTOR (BAG) ×2 IMPLANT
NEEDLE TRANSEP BRK 71CM 407200 (NEEDLE) ×2 IMPLANT
PACK EP LATEX FREE (CUSTOM PROCEDURE TRAY) ×1
PACK EP LF (CUSTOM PROCEDURE TRAY) ×1 IMPLANT
PAD DEFIB LIFELINK (PAD) ×2 IMPLANT
PATCH CARTO3 (PAD) ×2 IMPLANT
SHEATH AVANTI 11F 11CM (SHEATH) ×2 IMPLANT
SHEATH PINNACLE 7F 10CM (SHEATH) ×4 IMPLANT
SHEATH PINNACLE 8F 10CM (SHEATH) IMPLANT
SHEATH PINNACLE 9F 10CM (SHEATH) ×2 IMPLANT
SHEATH SWARTZ TS SL2 63CM 8.5F (SHEATH) ×2 IMPLANT
SHIELD RADPAD SCOOP 12X17 (MISCELLANEOUS) ×2 IMPLANT
TUBING SMART ABLATE COOLFLOW (TUBING) ×2 IMPLANT

## 2016-05-10 NOTE — Anesthesia Procedure Notes (Signed)
Procedure Name: LMA Insertion Performed by: Valda Favia Pre-anesthesia Checklist: Patient identified, Emergency Drugs available, Suction available, Patient being monitored and Timeout performed Patient Re-evaluated:Patient Re-evaluated prior to inductionOxygen Delivery Method: Simple face mask Intubation Type: IV induction Number of attempts: 1 Placement Confirmation: positive ETCO2 Dental Injury: Teeth and Oropharynx as per pre-operative assessment

## 2016-05-10 NOTE — Interval H&P Note (Signed)
History and Physical Interval Note:  05/10/2016 7:18 AM  Brian Glass  has presented today for surgery, with the diagnosis of afib  The various methods of treatment have been discussed with the patient and family. After consideration of risks, benefits and other options for treatment, the patient has consented to  Procedure(s): Atrial Fibrillation Ablation (N/A) as a surgical intervention .  The patient's history has been reviewed, patient examined, no change in status, stable for surgery.  I have reviewed the patient's chart and labs.  Questions were answered to the patient's satisfaction.    Cardiac CT reviewed with the patient today.  [name]

## 2016-05-10 NOTE — Discharge Summary (Signed)
ELECTROPHYSIOLOGY PROCEDURE DISCHARGE SUMMARY    Patient ID: Brian Glass,  MRN: [id number], DOB/AGE: [birth year] 66 y.o.  Admit date: 05/10/2016 Discharge date: 05/11/16  Primary Care Physician: [name], NP  Primary Cardiologist: Dr. Wende Bushy Electrophysiologist: Dr. [name]  Primary Discharge Diagnosis:  1. Paroxysmal AFib and flutter     CHA2DS2Vasc is 1 (age), on Eliquis  Secondary Discharge Diagnosis:  none  Procedures This Admission:  1.  Electrophysiology study and radiofrequency catheter ablation on 05/10/16 by Dr [name].  This study demonstrated  CONCLUSIONS: 1. Sinus rhythm upon presentation.   2. Intracardiac echo reveals a moderate sized left atrium with four separate pulmonary veins without evidence of pulmonary vein stenosis. 3. Successful electrical isolation and anatomical encircling of all four pulmonary veins with radiofrequency current.    4. Cavo-tricuspid isthmus ablation was performed with complete bidirectional isthmus block achieved.  5. Additional ablation was performed at the SVC/RA junction 6. No inducible arrhythmias following ablation both on and off of Isuprel 7. No early apparent complications.   Brief HPI: Brian Glass is a 66 y.o. male with a history of paroxysmal AFib and flutter.  They have failed medical therapy with Multaq. Risks, benefits, and alternatives to catheter ablation of atrial fibrillation were reviewed with the patient who wished to proceed.  The patient underwent cardiac CT prior to the procedure which demonstrated no LAA thrombus.    Hospital Course:  The patient was admitted and underwent EPS/RFCA of atrial fibrillation with details as outlined above.  He was monitored on telemetry overnight which demonstrated SR.  Groin was without complication on the day of discharge.  The patient was examined by Dr. Adalberto Ill and considered to be stable for discharge.  Wound care and restrictions were reviewed  with the patient.  The patient will be seen back by Roderic Palau, NP in 4 weeks and Dr Rayann Heman in 12 weeks for post ablation follow up.     Physical Exam: Vitals:   05/10/16 1951 05/11/16 0040 05/11/16 0416 05/11/16 0748  BP: 108/75 102/68 (!) 97/59 109/74  Pulse: (!) 59 (!) 57 (!) 56 67  Resp: 13 12 12 18   Temp: 98.1 F (36.7 C) 97.5 F (36.4 C) 98.3 F (36.8 C) 97.9 F (36.6 C)  TempSrc: Oral Oral  Oral  SpO2: 100% 95% 96% 99%  Weight:   173 lb 11.2 oz (78.8 kg)   Height:        GEN- The patient is well appearing, alert and oriented x 3 today.   HEENT: normocephalic, atraumatic; sclera clear, conjunctiva pink; hearing intact; oropharynx clear; neck supple  Lungs- CTA b/l, normal work of breathing.  No wheezes, rales, rhonchi Heart- RRR, no murmurs, rubs or gallops  GI- soft, non-tender, non-distended, bowel sounds present  Extremities- no clubbing, cyanosis, or edema; DP/PT/radial pulses 2+ bilaterally, groin without hematoma/bruit MS- no significant deformity or atrophy Skin- warm and dry, no rash or lesion Psych- euthymic mood, full affect Neuro- strength and sensation are intact   Labs:   Lab Results  Component Value Date   WBC 4.3 05/10/2016   HGB 14.3 05/10/2016   HCT 39.5 05/10/2016   MCV 85.9 05/10/2016   PLT 131 (L) 05/10/2016     Recent Labs Lab 05/10/16 0601  NA 141  K 4.1  CL 106  CO2 26  BUN 21*  CREATININE 1.17  CALCIUM 9.4  GLUCOSE 99     Discharge Medications:  Allergies as of 05/11/2016  Reactions   Sudafed [pseudoephedrine Hcl]    Hard to urinate       Medication List    TAKE these medications   apixaban 5 MG Tabs tablet Commonly known as:  ELIQUIS Take 1 tablet (5 mg total) by mouth 2 (two) times daily.   EQ FIBER LAXATIVE PO Take 1 capsule by mouth 2 (two) times daily.   FISH OIL PO Take 1,300 mg by mouth daily.   fluticasone 50 MCG/ACT nasal spray Commonly known as:  FLONASE Place 1 spray into both nostrils daily  as needed for allergies.   olopatadine 0.1 % ophthalmic solution Commonly known as:  PATANOL Place 1 drop into both eyes 2 (two) times daily. What changed:  when to take this  reasons to take this   pantoprazole 40 MG tablet Commonly known as:  PROTONIX Take 1 tablet (40 mg total) by mouth daily.   SENIOR MULTIVITAMIN PLUS PO Take 1 tablet by mouth daily.   tamsulosin 0.4 MG Caps capsule Commonly known as:  FLOMAX Take 0.4 mg by mouth daily.   VITAMIN B COMPLEX PO Take 1 tablet by mouth daily.   VITAMIN D3 PO Take 1 capsule by mouth daily.       Disposition:  Home Discharge Instructions    Diet - low sodium heart healthy    Complete by:  As directed    Increase activity slowly    Complete by:  As directed      Follow-up Information    [facility] Follow up on 06/07/2016.   Specialty:  Cardiology Why:  10:00AM Contact information: [address] [id number] [address] [place] [phone]       [name], MD Follow up on 08/08/2016.   Specialty:  Cardiology Why:  10:45AM Contact information: [place] [address] [phone]           Duration of Discharge Encounter: Greater than 30 minutes including physician time.  Signed, Tommye Standard, PA-C 05/11/2016 8:00 AM   I have seen, examined the patient, and reviewed the above assessment and plan. On exam, RRR.  Changes to above are made where necessary.    Co Sign: [name], MD 05/11/2016 10:16 PM

## 2016-05-10 NOTE — H&P (View-Only) (Signed)
Electrophysiology Office Note   Date:  04/16/2016   ID:  Brian Glass, DOB Jul 20, 1950, MRN [id number]  PCP:  [name], NP   Primary Electrophysiologist: Dr [name]  Chief Complaint  Patient presents with  . Advice Only    PAF/Discuss afib ablation     History of Present Illness: Brian Glass is a 66 y.o. male who presents today for electrophysiology evaluation.   The patient has both atrial fibrillation and atrial flutter.  He has had afib for quite some time.  He was treated initially with multaq with success.  Unfortunately, his afib has progressed.  He reports symptoms of decreased exercise tolerance and fatigue.  Medical therapy has been limited by bradycardia and hypotension.  Today, he denies symptoms of palpitations, chest pain, shortness of breath, orthopnea, PND, lower extremity edema, claudication, dizziness, presyncope, syncope, bleeding, or neurologic sequela. The patient is tolerating medications without difficulties and is otherwise without complaint today.    Past Medical History:  Diagnosis Date  . Allergy   . Arthritis   . Atrial fibrillation ([place])   . Bilateral vitreous detachment   . Chickenpox    Past Surgical History:  Procedure Laterality Date  . CATARACT EXTRACTION, BILATERAL  2016  . TONSILLECTOMY  1955     Current Outpatient Prescriptions  Medication Sig Dispense Refill  . apixaban (ELIQUIS) 5 MG TABS tablet Take 1 tablet (5 mg total) by mouth 2 (two) times daily. 180 tablet 3  . Calcium Polycarbophil (EQ FIBER LAXATIVE PO) Take 1 capsule by mouth 2 (two) times daily.    . fluticasone (FLONASE) 50 MCG/ACT nasal spray Place 1 spray into both nostrils daily.    . Multiple Vitamins-Minerals (SENIOR MULTIVITAMIN PLUS PO) Take 1 tablet by mouth daily.    Marland Kitchen olopatadine (PATANOL) 0.1 % ophthalmic solution Place 1 drop into both eyes 2 (two) times daily. 5 mL 1  . Omega-3 Fatty Acids (FISH OIL) 1200 MG CAPS Take 1 capsule by mouth daily.     . tamsulosin (FLOMAX) 0.4 MG CAPS capsule Take 0.4 mg by mouth daily.     No current facility-administered medications for this visit.     Allergies:   Sudafed [pseudoephedrine hcl]   Social History:  The patient  reports that he has never smoked. He has never used smokeless tobacco. He reports that he drinks alcohol. He reports that he does not use drugs.   Family History:  The patient's family history includes Atrial fibrillation in his brother; Heart attack in his father; Heart disease in his brother, father, and mother; Hyperlipidemia in his father and mother; Hypertension in his father and mother; Prostate cancer in his brother; Sleep apnea in his brother; Stroke in his father.    ROS:  Please see the history of present illness.   All other systems are reviewed and negative.    PHYSICAL EXAM: VS:  BP 112/68   Pulse 60   Ht 5\' 10"  (1.778 m)   Wt 175 lb 9.6 oz (79.7 kg)   BMI 25.20 kg/m  , BMI Body mass index is 25.2 kg/m. GEN: Well nourished, well developed, in no acute distress  HEENT: normal  Neck: no JVD, carotid bruits, or masses Cardiac: RRR; no murmurs, rubs, or gallops,no edema  Respiratory:  clear to auscultation bilaterally, normal work of breathing GI: soft, nontender, nondistended, + BS MS: no deformity or atrophy  Skin: warm and dry  Neuro:  Strength and sensation are intact Psych: euthymic mood, full affect  Recent Labs: 01/05/2016: ALT 22 03/20/2016: BUN 24; Creatinine, Ser 1.13; Potassium 4.1; Sodium 141    Lipid Panel  No results found for: CHOL, TRIG, HDL, CHOLHDL, VLDL, LDLCALC, LDLDIRECT   Wt Readings from Last 3 Encounters:  04/13/16 175 lb 9.6 oz (79.7 kg)  03/29/16 173 lb 8 oz (78.7 kg)  03/23/16 173 lb 12.8 oz (78.8 kg)      Other studies Reviewed: Additional studies/ records that were reviewed today include: Dr Aquilla Hacker, nots, prior echo  Review of the above records today demonstrates: as above   ASSESSMENT AND PLAN:  1.   Paroxysmal atrial fibrillation and atrial flutter The patient has symptomatic atrial arrhythmias.  He has failed medical therapy with mulatq.  Given bradycardia, his AAD options are limited.  We did discuss tikosyn as an option.  Therapeutic strategies for afib and atrial flutter including medicine and ablation were discussed in detail with the patient today. Risk, benefits, and alternatives to EP study and radiofrequency ablation were also discussed in detail today. These risks include but are not limited to stroke, bleeding, vascular damage, tamponade, perforation, damage to the esophagus, lungs, and other structures, pulmonary vein stenosis, worsening renal function, and death. The patient understands these risk and wishes to proceed.  We will therefore proceed with catheter ablation at the next available time. Will obtain cardiac CT prior to ablation to assess LAA for thrombus.   Current medicines are reviewed at length with the patient today.   The patient does not have concerns regarding his medicines.  The following changes were made today:  none    Signed, [name], MD    [place] [address] [phone] (office) [phone] (fax)

## 2016-05-10 NOTE — Transfer of Care (Signed)
Immediate Anesthesia Transfer of Care Note  Patient: Brian Glass  Procedure(s) Performed: Procedure(s): Atrial Fibrillation Ablation (N/A)  Patient Location: Cath Lab  Anesthesia Type:MAC  Level of Consciousness: sedated  Airway & Oxygen Therapy: Patient Spontanous Breathing and Patient connected to nasal cannula oxygen  Post-op Assessment: Report given to RN and Post -op Vital signs reviewed and stable  Post vital signs: Reviewed and stable  Last Vitals:  Vitals:   05/10/16 0541  BP: 114/76  Pulse: (!) 115  Resp: 18  Temp: 36.5 C    Last Pain:  Vitals:   05/10/16 0541  TempSrc: Oral         Complications: No apparent anesthesia complications

## 2016-05-10 NOTE — Progress Notes (Signed)
Manual compression appied to right groin Order for sheath removal verified per post procedural orders. Procedure explained to patient and right venous 7Fr. , 9Fr. And 11Fr. access site assessed: level 0, palpable dorsalis pedis and posterior tibial pulses. 7, 9,and 11 French Sheath removed and manual pressure applied for 20 minutes. Pre, peri, & post procedural vitals: HR 57, RR 11, O2 Sat 100%, BP 114 / 88, Pain level 0. Distal pulses remained intact after sheath removal. Access site level 0 and dressed with 4X4 gauze and tegaderm.  Judge Stall, Rt-R, RCIS confirmed condition of site. Post procedural instructions discussed with return demonstration from patient.

## 2016-05-10 NOTE — Anesthesia Postprocedure Evaluation (Signed)
Anesthesia Post Note  Patient: Brian Glass  Procedure(s) Performed: Procedure(s) (LRB): Atrial Fibrillation Ablation (N/A)  Patient location during evaluation: PACU Anesthesia Type: MAC Level of consciousness: awake and alert Pain management: pain level controlled Vital Signs Assessment: post-procedure vital signs reviewed and stable Respiratory status: spontaneous breathing, nonlabored ventilation and respiratory function stable Cardiovascular status: stable and blood pressure returned to baseline Anesthetic complications: no       Last Vitals:  Vitals:   05/10/16 1210 05/10/16 1214  BP: 111/88   Pulse: (!) 47   Resp: 16   Temp:  36.3 C    Last Pain:  Vitals:   05/10/16 1214  TempSrc: Temporal                 Inigo Lantigua,W. EDMOND

## 2016-05-11 DIAGNOSIS — I480 Paroxysmal atrial fibrillation: Secondary | ICD-10-CM

## 2016-05-11 DIAGNOSIS — Z8249 Family history of ischemic heart disease and other diseases of the circulatory system: Secondary | ICD-10-CM | POA: Diagnosis not present

## 2016-05-11 DIAGNOSIS — Z7901 Long term (current) use of anticoagulants: Secondary | ICD-10-CM | POA: Diagnosis not present

## 2016-05-11 DIAGNOSIS — Z7951 Long term (current) use of inhaled steroids: Secondary | ICD-10-CM | POA: Diagnosis not present

## 2016-05-11 DIAGNOSIS — I959 Hypotension, unspecified: Secondary | ICD-10-CM | POA: Diagnosis not present

## 2016-05-11 DIAGNOSIS — M1990 Unspecified osteoarthritis, unspecified site: Secondary | ICD-10-CM | POA: Diagnosis not present

## 2016-05-11 DIAGNOSIS — I4892 Unspecified atrial flutter: Secondary | ICD-10-CM | POA: Diagnosis not present

## 2016-05-11 DIAGNOSIS — Z823 Family history of stroke: Secondary | ICD-10-CM | POA: Diagnosis not present

## 2016-05-11 MED ORDER — PANTOPRAZOLE SODIUM 40 MG PO TBEC: 40.0000 mg | DELAYED_RELEASE_TABLET | Freq: Every day | ORAL | 0 refills | Status: DC

## 2016-05-11 MED FILL — Diphenhydramine HCl Inj 50 MG/ML: INTRAMUSCULAR | Qty: 1 | Status: AC

## 2016-05-11 NOTE — Discharge Instructions (Signed)
No driving until [id number]. No lifting over 5 lbs for 1 week. No vigorous or sexual activity for 1 week. You may return to work on 05/17/16. Keep procedure site clean & dry. If you notice increased pain, swelling, bleeding or pus, call/return!  You may shower, but no soaking baths/hot tubs/pools for 1 week.   You have an appointment set up with the [facility].  Multiple studies have shown that being followed by a dedicated atrial fibrillation clinic in addition to the standard care you receive from your other physicians improves health. We believe that enrollment in the atrial fibrillation clinic will allow [place] to better care for you.   The phone number to the [facility] is [phone]. The clinic is staffed Monday through Friday from 8:30am to 5pm.  Parking Directions: The clinic is located in the Heart and Vascular Building connected to [facility] 1)From [address] turn on to [address] and go to the 3rd entrance  (Heart and Vascular entrance) on the right. 2)Look to the right for Heart &Vascular Parking Garage. 3)A code for the entrance is 5002   4)Take the elevators to the 1st floor. Registration is in the room with the glass walls at the end of the hallway.  If you have any trouble parking or locating the clinic, please dont hesitate to call [phone].

## 2016-05-11 NOTE — Progress Notes (Signed)
Pt discharged to home via [facility], discharge education complete, pt verbalized understanding of activity progression, condition stable, accompanied by family.  Edward Qualia RN

## 2016-05-30 DIAGNOSIS — C44311 Basal cell carcinoma of skin of nose: Secondary | ICD-10-CM | POA: Diagnosis not present

## 2016-06-07 ENCOUNTER — Encounter (HOSPITAL_COMMUNITY): Payer: Self-pay | Admitting: Nurse Practitioner

## 2016-06-07 ENCOUNTER — Ambulatory Visit (HOSPITAL_COMMUNITY)
Admit: 2016-06-07 | Discharge: 2016-06-07 | Disposition: A | Discharge status: Home / Self Care | Payer: Medicare Other | Attending: Nurse Practitioner | Admitting: Nurse Practitioner

## 2016-06-07 VITALS — BP 124/86 | HR 78 | Ht 70.0 in | Wt 174.6 lb

## 2016-06-07 DIAGNOSIS — I517 Cardiomegaly: Secondary | ICD-10-CM | POA: Insufficient documentation

## 2016-06-07 DIAGNOSIS — I480 Paroxysmal atrial fibrillation: Secondary | ICD-10-CM

## 2016-06-07 DIAGNOSIS — I4891 Unspecified atrial fibrillation: Secondary | ICD-10-CM | POA: Insufficient documentation

## 2016-06-07 DIAGNOSIS — I4510 Unspecified right bundle-branch block: Secondary | ICD-10-CM | POA: Insufficient documentation

## 2016-06-07 DIAGNOSIS — Z9889 Other specified postprocedural states: Secondary | ICD-10-CM | POA: Diagnosis not present

## 2016-06-07 DIAGNOSIS — H43813 Vitreous degeneration, bilateral: Secondary | ICD-10-CM | POA: Insufficient documentation

## 2016-06-07 NOTE — Progress Notes (Signed)
Primary Care Physician: Brian Flow, NP Referring Physician: Dr. Becky Glass Brian Glass is a 66 y.o. male with a h/o afib have failed multaq in the past. He had an ablation 2/22 and is in the afib clinic for f/u. He has random mild shortness of breath. He has not noted any heart irregularity.  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 He has a chadsvasc score of 1 and is hoping to stop blood thinner when seen by Dr. Rayann Glass in May. He denies any swallowing or groin issues. Still has some resolving bruising of rt thigh.  Today, he denies symptoms of palpitations, chest pain, shortness of breath, orthopnea, PND, lower extremity edema, dizziness, presyncope, syncope, or neurologic sequela. The patient is tolerating medications without difficulties and is otherwise without complaint today.   Past Medical History:  Diagnosis Date  . Allergy   . Arthritis   . Atrial fibrillation ([place])   . Bilateral vitreous detachment   . Chickenpox   . Typical atrial flutter [facility])    Past Surgical History:  Procedure Laterality Date  . ATRIAL FIBRILLATION ABLATION N/A 05/10/2016   Procedure: Atrial Fibrillation Ablation;  Surgeon: [name], MD;  Location: [facility];  Service: Cardiovascular;  Laterality: N/A;  . CATARACT EXTRACTION, BILATERAL  2016  . TONSILLECTOMY  1955    Current Outpatient Prescriptions  Medication Sig Dispense Refill  . apixaban (ELIQUIS) 5 MG TABS tablet Take 1 tablet (5 mg total) by mouth 2 (two) times daily. 180 tablet 3  . B Complex Vitamins (VITAMIN B COMPLEX PO) Take 1 tablet by mouth daily.    . Calcium Polycarbophil (EQ FIBER LAXATIVE PO) Take 1 capsule by mouth 2 (two) times daily.    . Cholecalciferol (VITAMIN D3 PO) Take 1 capsule by mouth daily.    . fluticasone (FLONASE) 50 MCG/ACT nasal spray Place 1 spray into both nostrils daily as needed for allergies.     . Multiple Vitamins-Minerals (SENIOR MULTIVITAMIN PLUS  PO) Take 1 tablet by mouth daily.    Marland Kitchen olopatadine (PATANOL) 0.1 % ophthalmic solution Place 1 drop into both eyes 2 (two) times daily. (Patient taking differently: Place 1 drop into both eyes 2 (two) times daily as needed for allergies. ) 5 mL 1  . Omega-3 Fatty Acids (FISH OIL PO) Take 1,300 mg by mouth daily.    . tamsulosin (FLOMAX) 0.4 MG CAPS capsule Take 0.4 mg by mouth daily.     No current facility-administered medications for this encounter.     Allergies  Allergen Reactions  . Sudafed [Pseudoephedrine Hcl]     Hard to urinate     Social History   Social History  . Marital status: Married    Spouse name: N/A  . Number of children: N/A  . Years of education: N/A   Occupational History  . Not on file.   Social History Main Topics  . Smoking status: Never Smoker  . Smokeless tobacco: Never Used  . Alcohol use Yes     Comment: social  . Drug use: No  . Sexual activity: Not on file   Other Topics Concern  . Not on file   Social History Narrative   Married.   3 children. 2 grandchildren.   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    Family History  Problem Relation Age of Onset  . Hyperlipidemia Mother   . Heart disease Mother   .  Hypertension Mother   . Hyperlipidemia Father   . Heart disease Father   . Stroke Father   . Hypertension Father   . Heart attack Father   . Heart disease Brother   . Prostate cancer Brother   . Atrial fibrillation Brother   . Sleep apnea Brother     ROS- All systems are reviewed and negative except as per the HPI above  Physical Exam: Vitals:   06/07/16 0959  BP: 124/86  Pulse: 78  SpO2: 94%  Weight: 174 lb 9.6 oz (79.2 kg)  Height: 5\' 10"  (1.778 m)   Wt Readings from Last 3 Encounters:  06/07/16 174 lb 9.6 oz (79.2 kg)  05/11/16 173 lb 11.2 oz (78.8 kg)  04/13/16 175 lb 9.6 oz (79.7 kg)    Labs: Lab Results  Component Value Date   NA 141 05/10/2016   K 4.1 05/10/2016   CL 106  05/10/2016   CO2 26 05/10/2016   GLUCOSE 99 05/10/2016   BUN 21 (H) 05/10/2016   CREATININE 1.17 05/10/2016   CALCIUM 9.4 05/10/2016   No results found for: INR No results found for: CHOL, HDL, LDLCALC, TRIG   GEN- The patient is well appearing, alert and oriented x 3 today.   Head- normocephalic, atraumatic Eyes-  Sclera clear, conjunctiva pink Ears- hearing intact Oropharynx- clear Neck- supple, no JVP Lymph- no cervical lymphadenopathy Lungs- Clear to ausculation bilaterally, normal work of breathing Heart- Regular rate and rhythm, no murmurs, rubs or gallops, PMI not laterally displaced GI- soft, NT, ND, + BS Extremities- no clubbing, cyanosis, or edema MS- no significant deformity or atrophy Skin- no rash or lesion Psych- euthymic mood, full affect Neuro- strength and sensation are intact  EKG-NSR with IRBBB pr int 178 ms, qrs int 96 ms, qtc 469 ms Epic records reviewed    Assessment and Plan: 1. Afib Has done well since ablation with no appreciable afib Continue eliquis Has returned to daily walking without symptoms  f/u with Dr. Rayann Glass 5/23 afib clinic as nneded  Geroge Glass. Brian Glass, [facility] [address] [place], [place] [address] [phone]

## 2016-06-08 ENCOUNTER — Encounter: Payer: Self-pay | Admitting: Primary Care

## 2016-06-08 ENCOUNTER — Other Ambulatory Visit: Payer: Self-pay | Admitting: Primary Care

## 2016-06-08 DIAGNOSIS — N400 Enlarged prostate without lower urinary tract symptoms: Secondary | ICD-10-CM

## 2016-06-08 MED ORDER — TAMSULOSIN HCL 0.4 MG PO CAPS: 0.4000 mg | ORAL_CAPSULE | Freq: Every day | ORAL | 2 refills | Status: DC

## 2016-07-23 ENCOUNTER — Encounter: Payer: Self-pay | Admitting: *Deleted

## 2016-08-08 ENCOUNTER — Ambulatory Visit (INDEPENDENT_AMBULATORY_CARE_PROVIDER_SITE_OTHER): Payer: Medicare Other | Admitting: Internal Medicine

## 2016-08-08 ENCOUNTER — Encounter: Payer: Self-pay | Admitting: Internal Medicine

## 2016-08-08 VITALS — BP 98/74 | HR 67 | Ht 70.0 in | Wt 170.8 lb

## 2016-08-08 DIAGNOSIS — I480 Paroxysmal atrial fibrillation: Secondary | ICD-10-CM

## 2016-08-08 NOTE — Patient Instructions (Signed)
Medication Instructions:  Your physician has recommended you make the following change in your medication:  1) STOP Eliquis   Labwork: None ordered   Testing/Procedures: None ordered   Follow-Up: Your physician wants you to follow-up in: 3 months with Dr. Rayann Heman     Any Other Special Instructions Will Be Listed Below (If Applicable).     If you need a refill on your cardiac medications before your next appointment, please call your pharmacy.

## 2016-08-08 NOTE — Progress Notes (Signed)
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  Since his recent ablation, the patient reports doing very well.  Denies procedure related complications.  No symptoms of afib.   Today, he denies symptoms of palpitations, chest pain, shortness of breath,  lower extremity edema, dizziness, presyncope, or syncope.  The patient is otherwise without complaint today.   Past Medical History:  Diagnosis Date  . Allergy   . Arthritis   . Atrial fibrillation ([place])   . Bilateral vitreous detachment   . Chickenpox   . Typical atrial flutter [facility])    Past Surgical History:  Procedure Laterality Date  . ATRIAL FIBRILLATION ABLATION N/A 05/10/2016   Procedure: Atrial Fibrillation Ablation;  Surgeon: [name], MD;  Location: [facility];  Service: Cardiovascular;  Laterality: N/A;  . CATARACT EXTRACTION, BILATERAL  2016  . TONSILLECTOMY  1955    ROS- all systems are reviewed and negatives except as per HPI above  Current Outpatient Prescriptions  Medication Sig Dispense Refill  . apixaban (ELIQUIS) 5 MG TABS tablet Take 1 tablet (5 mg total) by mouth 2 (two) times daily. 180 tablet 3  . B Complex Vitamins (VITAMIN B COMPLEX PO) Take 1 tablet by mouth daily.    . Calcium Polycarbophil (EQ FIBER LAXATIVE PO) Take 1 capsule by mouth 2 (two) times daily.    . Cholecalciferol (VITAMIN D3 PO) Take 1 capsule by mouth daily.    . fluticasone (FLONASE) 50 MCG/ACT nasal spray Place 1 spray into both nostrils daily as needed for allergies.     . Multiple Vitamins-Minerals (SENIOR MULTIVITAMIN PLUS PO) Take 1 tablet by mouth daily.    Marland Kitchen olopatadine (PATANOL) 0.1 % ophthalmic solution Place 1 drop into both eyes 2 (two) times daily as needed (as directed).    . Omega-3 Fatty Acids (FISH OIL PO) Take 1,300 mg by mouth daily.    . tamsulosin (FLOMAX) 0.4 MG CAPS capsule Take 1 capsule (0.4 mg total) by mouth  daily. 90 capsule 2   No current facility-administered medications for this visit.     Physical Exam: Vitals:   08/08/16 1040  BP: 98/74  Pulse: 67  SpO2: 98%  Weight: 170 lb 12.8 oz (77.5 kg)  Height: 5\' 10"  (1.778 m)    GEN- The patient is well appearing, alert and oriented x 3 today.   Head- normocephalic, atraumatic Eyes-  Sclera clear, conjunctiva pink Ears- hearing intact Oropharynx- clear Lungs- Clear to ausculation bilaterally, normal work of breathing Heart- Regular rate and rhythm, no murmurs, rubs or gallops, PMI not laterally displaced GI- soft, NT, ND, + BS Extremities- no clubbing, cyanosis, or edema  ekg today reveals sinus rhythm, incomplete RBBB  Assessment and Plan:  1. Paroxysmal atrial fibrillation Doing very well s/p ablation off AAD therapy chads2vasc score is 1.  We discussed AHA/HRS guidelines regarding anticoagulation options for chadsvasc score of 1.  Pt is very clear in his decision to stop anticoagulation at this time.  Return in 3 months for additional follow-up He will contact our office if problems arise in the interim  [name] MD, [facility] 08/08/2016 11:28 AM

## 2016-11-12 ENCOUNTER — Encounter: Payer: Self-pay | Admitting: Internal Medicine

## 2016-11-12 ENCOUNTER — Ambulatory Visit (INDEPENDENT_AMBULATORY_CARE_PROVIDER_SITE_OTHER): Payer: Medicare Other | Admitting: Internal Medicine

## 2016-11-12 VITALS — BP 112/72 | HR 69 | Ht 70.0 in | Wt 168.4 lb

## 2016-11-12 DIAGNOSIS — I480 Paroxysmal atrial fibrillation: Secondary | ICD-10-CM | POA: Diagnosis not present

## 2016-11-12 NOTE — Progress Notes (Signed)
   PCP: Pleas Koch, NP  Primary EP: Dr Esther Hardy [name] is a 66 y.o. male who presents today for routine electrophysiology followup.  Since last being seen in our clinic, the patient reports doing very well.  Today, he denies symptoms of palpitations, chest pain, shortness of breath,  lower extremity edema, dizziness, presyncope, or syncope.  The patient is otherwise without complaint today.   Past Medical History:  Diagnosis Date  . Allergy   . Arthritis   . Atrial fibrillation ([place])   . Bilateral vitreous detachment   . Chickenpox   . Typical atrial flutter [facility])    Past Surgical History:  Procedure Laterality Date  . ATRIAL FIBRILLATION ABLATION N/A 05/10/2016   Procedure: Atrial Fibrillation Ablation;  Surgeon: Brian Grayer, MD;  Location: [facility];  Service: Cardiovascular;  Laterality: N/A;  . CATARACT EXTRACTION, BILATERAL  2016  . TONSILLECTOMY  1955    ROS- all systems are reviewed and negatives except as per HPI above  Current Outpatient Prescriptions  Medication Sig Dispense Refill  . B Complex Vitamins (VITAMIN B COMPLEX PO) Take 1 tablet by mouth daily.    . Calcium Polycarbophil (EQ FIBER LAXATIVE PO) Take 1 capsule by mouth 2 (two) times daily.    . Cholecalciferol (VITAMIN D3 PO) Take 1 capsule by mouth daily.    . fluticasone (FLONASE) 50 MCG/ACT nasal spray Place 1 spray into both nostrils daily as needed for allergies.     . Multiple Vitamins-Minerals (SENIOR MULTIVITAMIN PLUS PO) Take 1 tablet by mouth daily.    Marland Kitchen olopatadine (PATANOL) 0.1 % ophthalmic solution Place 1 drop into both eyes 2 (two) times daily as needed (as directed).    . Omega-3 Fatty Acids (FISH OIL PO) Take 1,300 mg by mouth daily.    . tamsulosin (FLOMAX) 0.4 MG CAPS capsule Take 1 capsule (0.4 mg total) by mouth daily. 90 capsule 2   No current facility-administered medications for this visit.     Physical Exam: Vitals:   11/12/16 1227  BP: 112/72  Pulse: 69    SpO2: 98%  Weight: 168 lb 6.4 oz (76.4 kg)  Height: 5\' 10"  (1.778 m)    GEN- The patient is well appearing, alert and oriented x 3 today.   Head- normocephalic, atraumatic Eyes-  Sclera clear, conjunctiva pink Ears- hearing intact Oropharynx- clear Lungs- Clear to ausculation bilaterally, normal work of breathing Heart- Regular rate and rhythm, no murmurs, rubs or gallops, PMI not laterally displaced GI- soft, NT, ND, + BS Extremities- no clubbing, cyanosis, or edema  EKG tracing ordered today is personally reviewed and shows sinus rhythm, normal ekg  Assessment and Plan:  1. Paroxysmal atrial fibrillation Doing very well s/p  Ablation off AAD therapy  No changes today Return in 6 months  Brian Grayer MD, [facility] 11/12/2016 12:49 PM

## 2016-11-12 NOTE — Patient Instructions (Signed)

## 2016-12-07 ENCOUNTER — Ambulatory Visit (INDEPENDENT_AMBULATORY_CARE_PROVIDER_SITE_OTHER): Payer: Medicare Other

## 2016-12-07 DIAGNOSIS — Z23 Encounter for immunization: Secondary | ICD-10-CM | POA: Diagnosis not present

## 2016-12-27 ENCOUNTER — Encounter: Payer: Self-pay | Admitting: Internal Medicine

## 2017-01-28 ENCOUNTER — Encounter: Payer: Self-pay | Admitting: Primary Care

## 2017-01-28 NOTE — Telephone Encounter (Signed)
I've not received anything from [employer], when was this sent? It looks like he needs a letter from his cardiologist as they did the ablation.  Please call patient and get more details.

## 2017-01-31 NOTE — Telephone Encounter (Signed)
Spoken to patient on 01/30/2017. Patient stated that he will have them to re-fax the paperwork. He was also told that this needs to be done by primary provider.

## 2017-01-31 NOTE — Telephone Encounter (Signed)
Noted. Will await paperwork.

## 2017-02-03 ENCOUNTER — Telehealth: Payer: Self-pay | Admitting: Primary Care

## 2017-02-03 NOTE — Telephone Encounter (Signed)
Please notify patient that we received a fax from [place] for request of records. I placed this request in your inbox.

## 2017-02-05 NOTE — Telephone Encounter (Signed)
Since [employer] requesting more than just one information on patient. Will have to send this request to medical record department.

## 2017-03-01 ENCOUNTER — Other Ambulatory Visit: Payer: Self-pay | Admitting: Primary Care

## 2017-03-01 DIAGNOSIS — N400 Enlarged prostate without lower urinary tract symptoms: Secondary | ICD-10-CM

## 2017-05-24 ENCOUNTER — Encounter: Payer: Self-pay | Admitting: Primary Care

## 2017-06-11 DIAGNOSIS — H2703 Aphakia, bilateral: Secondary | ICD-10-CM | POA: Diagnosis not present

## 2017-07-11 ENCOUNTER — Ambulatory Visit (INDEPENDENT_AMBULATORY_CARE_PROVIDER_SITE_OTHER): Payer: Medicare Other | Admitting: Primary Care

## 2017-07-11 ENCOUNTER — Encounter: Payer: Self-pay | Admitting: Primary Care

## 2017-07-11 VITALS — BP 100/66 | HR 61 | Temp 97.8°F | Ht 70.0 in | Wt 170.5 lb

## 2017-07-11 DIAGNOSIS — R3912 Poor urinary stream: Secondary | ICD-10-CM | POA: Diagnosis not present

## 2017-07-11 DIAGNOSIS — H9193 Unspecified hearing loss, bilateral: Secondary | ICD-10-CM | POA: Diagnosis not present

## 2017-07-11 DIAGNOSIS — N401 Enlarged prostate with lower urinary tract symptoms: Secondary | ICD-10-CM | POA: Diagnosis not present

## 2017-07-11 DIAGNOSIS — I480 Paroxysmal atrial fibrillation: Secondary | ICD-10-CM

## 2017-07-11 DIAGNOSIS — Z0000 Encounter for general adult medical examination without abnormal findings: Secondary | ICD-10-CM | POA: Diagnosis not present

## 2017-07-11 LAB — COMPREHENSIVE METABOLIC PANEL
ALBUMIN: 3.9 g/dL (ref 3.5–5.2)
ALT: 17 U/L (ref 0–53)
AST: 20 U/L (ref 0–37)
Alkaline Phosphatase: 81 U/L (ref 39–117)
BUN: 23 mg/dL (ref 6–23)
CALCIUM: 9.1 mg/dL (ref 8.4–10.5)
CHLORIDE: 105 meq/L (ref 96–112)
CO2: 30 meq/L (ref 19–32)
Creatinine, Ser: 1.09 mg/dL (ref 0.40–1.50)
GFR: 71.81 mL/min (ref >60.00)
Glucose, Bld: 98 mg/dL (ref 70–99)
Potassium: 5 mEq/L (ref 3.5–5.1)
Sodium: 138 mEq/L (ref 135–145)
Total Bilirubin: 0.7 mg/dL (ref 0.2–1.2)
Total Protein: 6.3 g/dL (ref 6.0–8.3)

## 2017-07-11 LAB — LIPID PANEL
CHOL/HDL RATIO: 3
CHOLESTEROL: 152 mg/dL (ref 0–200)
HDL: 55.2 mg/dL (ref >39.00)
LDL CALC: 84 mg/dL (ref 0–99)
NonHDL: 96.3
Triglycerides: 62 mg/dL (ref 0.0–149.0)
VLDL: 12.4 mg/dL (ref 0.0–40.0)

## 2017-07-11 NOTE — Assessment & Plan Note (Signed)
Immunizations UTD, Colonoscopy UTD, PSA negative in 2018. Screened for Hep C in 2018. Commended him on his healthy lifestyle, encouraged to continue. Referral placed to audiology for hearing testing. Advanced directives done per patient. All recommendations provided at end of visit.    I have personally reviewed and have noted: 1. The patient's medical and social history 2. Their use of alcohol, tobacco or illicit drugs 3. Their current medications and supplements 4. The patient's functional ability including ADL's, fall risks, home safety risks and  hearing or visual impairment. 5. Diet and physical activities 6. Evidence for depression or mood disorder

## 2017-07-11 NOTE — Assessment & Plan Note (Signed)
Managed on Flomax, failed other treatments. Does continue to experience a weak stream and difficulty initiating urination every other day. He would like to continue Flomax for now.   PSA unremarkable in 2018. Does not follow with Urology.

## 2017-07-11 NOTE — Assessment & Plan Note (Signed)
Rate and rhythm regular today.  Following with cardiology and off of Eliquis.

## 2017-07-11 NOTE — Progress Notes (Signed)
Patient ID: Brian Glass, male   DOB: [birth year], 67 y.o.   MRN: [id number]  HPI: Mr. Mckelvin is a 67 year old male who presents today for his Initial Wellness Visit.   Past Medical History:  Diagnosis Date  . Allergy   . Arthritis   . Atrial fibrillation ([place])   . Bilateral vitreous detachment   . Chickenpox   . Typical atrial flutter ([facility])     Current Outpatient Medications  Medication Sig Dispense Refill  . B Complex Vitamins (VITAMIN B COMPLEX PO) Take 1 tablet by mouth daily.    . Calcium Polycarbophil (EQ FIBER LAXATIVE PO) Take 1 capsule by mouth 2 (two) times daily.    . Cholecalciferol (VITAMIN D3 PO) Take 1 capsule by mouth daily.    . fluticasone (FLONASE) 50 MCG/ACT nasal spray Place 1 spray into both nostrils daily as needed for allergies.     . Multiple Vitamins-Minerals (SENIOR MULTIVITAMIN PLUS PO) Take 1 tablet by mouth daily.    Marland Kitchen olopatadine (PATANOL) 0.1 % ophthalmic solution Place 1 drop into both eyes 2 (two) times daily as needed (as directed).    . Omega-3 Fatty Acids (FISH OIL PO) Take 1,300 mg by mouth daily.    . tamsulosin (FLOMAX) 0.4 MG CAPS capsule TAKE 1 CAPSULE DAILY 90 capsule 1   No current facility-administered medications for this visit.     Allergies  Allergen Reactions  . Sudafed [Pseudoephedrine Hcl]     Hard to urinate     Family History  Problem Relation Age of Onset  . Hyperlipidemia Mother   . Heart disease Mother   . Hypertension Mother   . Hyperlipidemia Father   . Heart disease Father   . Stroke Father   . Hypertension Father   . Heart attack Father   . Heart disease Brother   . Prostate cancer Brother   . Atrial fibrillation Brother   . Sleep apnea Brother     Social History   Socioeconomic History  . Marital status: Married    Spouse name: Not on file  . Number of children: Not on file  . Years of education: Not on file  . Highest education level: Not on file  Occupational History  . Not on file  Social Needs   . Financial resource strain: Not on file  . Food insecurity:    Worry: Not on file    Inability: Not on file  . Transportation needs:    Medical: Not on file    Non-medical: Not on file  Tobacco Use  . Smoking status: Never Smoker  . Smokeless tobacco: Never Used  Substance and Sexual Activity  . Alcohol use: Yes    Comment: social  . Drug use: No  . Sexual activity: Not on file  Lifestyle  . Physical activity:    Days per week: Not on file    Minutes per session: Not on file  . Stress: Not on file  Relationships  . Social connections:    Talks on phone: Not on file    Gets together: Not on file    Attends religious service: Not on file    Active member of club or organization: Not on file    Attends meetings of clubs or organizations: Not on file    Relationship status: Not on file  . Intimate partner violence:    Fear of current or ex partner: Not on file    Emotionally abused: Not on file  Physically abused: Not on file    Forced sexual activity: Not on file  Other Topics Concern  . Not on file  Social History Narrative   Married.   3 children. 2 grandchildren.   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    Hospitiliaztions: None  Health Maintenance:    Flu: Completed last season  Tetanus: Completed in 2014  Pneumovax: Completed in 2013  Prevnar: Completed in 2018  Zostavax: Completed in 2014  Colonoscopy: Completed in 2013  Eye Doctor: Completed in April 2019  Dental Exam: Completes semi-annually  PSA: Negative in 2018  Hep C Screening: Completed in 2018    Providers: Alma Friendly, PCP   I have personally reviewed and have noted: 1. The patient's medical and social history 2. Their use of alcohol, tobacco or illicit drugs 3. Their current medications and supplements 4. The patient's functional ability including ADL's, fall risks, home safety risks and  hearing or visual impairment. 5. Diet and physical  activities 6. Evidence for depression or mood disorder  Subjective:   Review of Systems:   Constitutional: Denies fever, malaise, fatigue, headache or abrupt weight changes.  HEENT: Denies eye pain, eye redness, ear pain, ringing in the ears, wax buildup, runny nose, nasal congestion, bloody nose, or sore throat. He has noticed difficulty hearing that has progressed over the last 10 years.  Respiratory: Denies difficulty breathing, shortness of breath, cough or sputum production.   Cardiovascular: Denies chest pain, chest tightness, palpitations or swelling in the hands or feet.  Gastrointestinal: Denies abdominal pain, bloating, constipation, diarrhea or blood in the stool.  GU: Denies urgency, frequency, pain with urination, burning sensation, blood in urine, odor or discharge. Does experience weak urinary stream and hesitancy. Managed on Flomax.  Musculoskeletal: Denies decrease in range of motion, difficulty with gait, muscle pain or joint pain and swelling.  Skin: Denies redness, rashes, lesions or ulcercations. Has noticed new skin tags to left neck and wart to left thumb.  Neurological: Denies dizziness, difficulty with memory, difficulty with speech or problems with balance and coordination.  Psychiatric: Denies concerns for anxiety or depression.   No other specific complaints in a complete review of systems (except as listed in HPI above).  Objective:  PE:   BP 100/66   Pulse 61   Temp 97.8 F (36.6 C) (Oral)   Ht 5\' 10"  (1.778 m)   Wt 170 lb 8 oz (77.3 kg)   SpO2 97%   BMI 24.46 kg/m  Wt Readings from Last 3 Encounters:  07/11/17 170 lb 8 oz (77.3 kg)  11/12/16 168 lb 6.4 oz (76.4 kg)  08/08/16 170 lb 12.8 oz (77.5 kg)    General: Appears their stated age, well developed, well nourished in NAD. Skin: Warm, dry and intact. No rashes, lesions or ulcerations noted. HEENT: Head: normal shape and size; Eyes: sclera white, no icterus, conjunctiva pink, PERRLA and EOMs  intact; Ears: Tm's gray and intact, normal light reflex; Nose: mucosa pink and moist, septum midline; Throat/Mouth: Teeth present, mucosa pink and moist, no exudate, lesions or ulcerations noted.  Neck: Normal range of motion. Neck supple, trachea midline. No massses, lumps or thyromegaly present.  Cardiovascular: Normal rate and rhythm. S1,S2 noted.  No murmur, rubs or gallops noted. No JVD or BLE edema. No carotid bruits noted. Pulmonary/Chest: Normal effort and positive vesicular breath sounds. No respiratory distress. No wheezes, rales or ronchi noted.  Abdomen: Soft and nontender. Normal bowel sounds, no bruits  noted. No distention or masses noted. Liver, spleen and kidneys non palpable. Musculoskeletal: Normal range of motion. No signs of joint swelling. No difficulty with gait.  Neurological: Alert and oriented. Cranial nerves II-XII intact. Coordination normal. +DTRs bilaterally. Psychiatric: Mood and affect normal. Behavior is normal. Judgment and thought content normal.    BMET    Component Value Date/Time   NA 141 05/10/2016 0601   NA 143 04/24/2016 0826   K 4.1 05/10/2016 0601   CL 106 05/10/2016 0601   CO2 26 05/10/2016 0601   GLUCOSE 99 05/10/2016 0601   BUN 21 (H) 05/10/2016 0601   BUN 20 04/24/2016 0826   CREATININE 1.17 05/10/2016 0601   CALCIUM 9.4 05/10/2016 0601   GFRNONAA >60 05/10/2016 0601   GFRAA >60 05/10/2016 0601    Lipid Panel  No results found for: CHOL, TRIG, HDL, CHOLHDL, VLDL, LDLCALC  CBC    Component Value Date/Time   WBC 4.3 05/10/2016 0601   RBC 4.60 05/10/2016 0601   HGB 14.3 05/10/2016 0601   HGB 13.8 04/24/2016 0826   HCT 39.5 05/10/2016 0601   HCT 38.2 04/24/2016 0826   PLT 131 (L) 05/10/2016 0601   PLT 128 (L) 04/24/2016 0826   MCV 85.9 05/10/2016 0601   MCV 86 04/24/2016 0826   MCH 31.1 05/10/2016 0601   MCHC 36.2 (H) 05/10/2016 0601   RDW 12.5 05/10/2016 0601   RDW 13.1 04/24/2016 0826   LYMPHSABS 1.4 04/24/2016 0826   EOSABS  0.1 04/24/2016 0826   BASOSABS 0.0 04/24/2016 0826    Hgb A1C No results found for: HGBA1C    Assessment and Plan:   Medicare Annual Wellness Visit:  Diet: He endorses a healthy diet. Breakfast: Cereal, fruit  Lunch: Peanut butter sandwich Dinner: Vegetables, beans, pizza, pasta, lentils Snacks: Cookies, trail mix, ice cream  Desserts: Daily Beverages: Coffee, orange juice, milk, water, beer (every other day) Physical activity: Biking twice weekly, yoga once weekly, push ups Depression/mood screen: Negative Hearing: Intact to whispered voice Visual acuity: Grossly normal, performs annual eye exam  ADLs: Capable Fall risk: None Home safety: Good Cognitive evaluation: Intact to orientation, naming, recall and repetition EOL planning: Adv directives completed. Full code.   Preventative Medicine: Immunizations UTD, Colonoscopy UTD, PSA negative in 2018. Screened for Hep C in 2018. Commended him on his healthy lifestyle, encouraged to continue. Referral placed to audiology for hearing testing. Advanced directives done per patient. All recommendations provided at end of visit.    Next appointment: One year

## 2017-07-11 NOTE — Patient Instructions (Addendum)
You will be contacted regarding your referral to Audiology.  Please let us know if you have not been contacted within one week.   Continue exercising. You should be getting 150 minutes of moderate intensity exercise weekly.  Continue to work on a healthy diet. Ensure you are consuming 64 ounces of water daily.  Stop by the lab prior to leaving today. I will notify you of your results once received.   Follow up in 1 year for your annual exam or sooner if needed.  It was a pleasure to see you today!   Preventive Care 67 Years and Older, Male Preventive care refers to lifestyle choices and visits with your health care provider that can promote health and wellness. What does preventive care include?  A yearly physical exam. This is also called an annual well check.  Dental exams once or twice a year.  Routine eye exams. Ask your health care provider how often you should have your eyes checked.  Personal lifestyle choices, including: ? Daily care of your teeth and gums. ? Regular physical activity. ? Eating a healthy diet. ? Avoiding tobacco and drug use. ? Limiting alcohol use. ? Practicing safe sex. ? Taking low doses of aspirin every day. ? Taking vitamin and mineral supplements as recommended by your health care provider. What happens during an annual well check? The services and screenings done by your health care provider during your annual well check will depend on your age, overall health, lifestyle risk factors, and family history of disease. Counseling Your health care provider may ask you questions about your:  Alcohol use.  Tobacco use.  Drug use.  Emotional well-being.  Home and relationship well-being.  Sexual activity.  Eating habits.  History of falls.  Memory and ability to understand (cognition).  Work and work Statistician.  Screening You may have the following tests or measurements:  Height, weight, and BMI.  Blood pressure.  Lipid and  cholesterol levels. These may be checked every 5 years, or more frequently if you are over 67 years old.  Skin check.  Lung cancer screening. You may have this screening every year starting at age 38 if you have a 30-pack-year history of smoking and currently smoke or have quit within the past 15 years.  Fecal occult blood test (FOBT) of the stool. You may have this test every year starting at age 62.  Flexible sigmoidoscopy or colonoscopy. You may have a sigmoidoscopy every 5 years or a colonoscopy every 10 years starting at age 67.  Prostate cancer screening. Recommendations will vary depending on your family history and other risks.  Hepatitis C blood test.  Hepatitis B blood test.  Sexually transmitted disease (STD) testing.  Diabetes screening. This is done by checking your blood sugar (glucose) after you have not eaten for a while (fasting). You may have this done every 1-3 years.  Abdominal aortic aneurysm (AAA) screening. You may need this if you are a current or former smoker.  Osteoporosis. You may be screened starting at age 18 if you are at high risk.  Talk with your health care provider about your test results, treatment options, and if necessary, the need for more tests. Vaccines Your health care provider may recommend certain vaccines, such as:  Influenza vaccine. This is recommended every year.  Tetanus, diphtheria, and acellular pertussis (Tdap, Td) vaccine. You may need a Td booster every 10 years.  Varicella vaccine. You may need this if you have not been vaccinated.  Zoster  vaccine. You may need this after age 55.  Measles, mumps, and rubella (MMR) vaccine. You may need at least one dose of MMR if you were born in 1957 or later. You may also need a second dose.  Pneumococcal 13-valent conjugate (PCV13) vaccine. One dose is recommended after age 27.  Pneumococcal polysaccharide (PPSV23) vaccine. One dose is recommended after age 6.  Meningococcal vaccine.  You may need this if you have certain conditions.  Hepatitis A vaccine. You may need this if you have certain conditions or if you travel or work in places where you may be exposed to hepatitis A.  Hepatitis B vaccine. You may need this if you have certain conditions or if you travel or work in places where you may be exposed to hepatitis B.  Haemophilus influenzae type b (Hib) vaccine. You may need this if you have certain risk factors.  Talk to your health care provider about which screenings and vaccines you need and how often you need them. This information is not intended to replace advice given to you by your health care provider. Make sure you discuss any questions you have with your health care provider. Document Released: 04/01/2015 Document Revised: 11/23/2015 Document Reviewed: 01/04/2015 Elsevier Interactive Patient Education  [employer].

## 2017-07-15 DIAGNOSIS — H903 Sensorineural hearing loss, bilateral: Secondary | ICD-10-CM | POA: Diagnosis not present

## 2017-08-08 ENCOUNTER — Other Ambulatory Visit: Payer: Self-pay | Admitting: Primary Care

## 2017-08-08 DIAGNOSIS — N400 Enlarged prostate without lower urinary tract symptoms: Secondary | ICD-10-CM

## 2017-09-11 ENCOUNTER — Encounter: Payer: Self-pay | Admitting: Primary Care

## 2017-10-07 ENCOUNTER — Ambulatory Visit (INDEPENDENT_AMBULATORY_CARE_PROVIDER_SITE_OTHER): Payer: Medicare Other | Admitting: Primary Care

## 2017-10-07 ENCOUNTER — Encounter: Payer: Self-pay | Admitting: Primary Care

## 2017-10-07 VITALS — BP 100/60 | HR 69 | Temp 97.8°F | Ht 70.0 in | Wt 170.2 lb

## 2017-10-07 DIAGNOSIS — B079 Viral wart, unspecified: Secondary | ICD-10-CM | POA: Diagnosis not present

## 2017-10-07 NOTE — Progress Notes (Signed)
Subjective:    Patient ID: Teague Goynes, male    DOB: 1951-01-05, 67 y.o.   MRN: [id number]  HPI  Mr. Rosenow is a 67 year old male who presents today for further evaluation of wart. The wart is located to the left medial lateral first digit that has been present since March 2019. He's tried several treatment regimens OTC with temporary improvement but no resolve. He would like to try cryotherapy with liquid nitrogen today.   He denies erythema, swelling, pain, drainage.   Review of Systems  Musculoskeletal: Negative for arthralgias and joint swelling.  Skin: Negative for color change.       wart       Past Medical History:  Diagnosis Date  . Allergy   . Arthritis   . Atrial fibrillation ([place])   . Bilateral vitreous detachment   . Chickenpox   . Typical atrial flutter ([facility])      Social History   Socioeconomic History  . Marital status: Married    Spouse name: Not on file  . Number of children: Not on file  . Years of education: Not on file  . Highest education level: Not on file  Occupational History  . Not on file  Social Needs  . Financial resource strain: Not on file  . Food insecurity:    Worry: Not on file    Inability: Not on file  . Transportation needs:    Medical: Not on file    Non-medical: Not on file  Tobacco Use  . Smoking status: Never Smoker  . Smokeless tobacco: Never Used  Substance and Sexual Activity  . Alcohol use: Yes    Comment: social  . Drug use: No  . Sexual activity: Not on file  Lifestyle  . Physical activity:    Days per week: Not on file    Minutes per session: Not on file  . Stress: Not on file  Relationships  . Social connections:    Talks on phone: Not on file    Gets together: Not on file    Attends religious service: Not on file    Active member of club or organization: Not on file    Attends meetings of clubs or organizations: Not on file    Relationship status: Not on file  . Intimate partner violence:    Fear  of current or ex partner: Not on file    Emotionally abused: Not on file    Physically abused: Not on file    Forced sexual activity: Not on file  Other Topics Concern  . Not on file  Social History Narrative   Married.   3 children. 2 grandchildren.   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    Past Surgical History:  Procedure Laterality Date  . ATRIAL FIBRILLATION ABLATION N/A 05/10/2016   Procedure: Atrial Fibrillation Ablation;  Surgeon: [name], MD;  Location: [facility];  Service: Cardiovascular;  Laterality: N/A;  . CATARACT EXTRACTION, BILATERAL  2016  . TONSILLECTOMY  1955    Family History  Problem Relation Age of Onset  . Hyperlipidemia Mother   . Heart disease Mother   . Hypertension Mother   . Hyperlipidemia Father   . Heart disease Father   . Stroke Father   . Hypertension Father   . Heart attack Father   . Heart disease Brother   . Prostate cancer Brother   . Atrial fibrillation Brother   . Sleep  apnea Brother     Allergies  Allergen Reactions  . Sudafed [Pseudoephedrine Hcl]     Hard to urinate     Current Outpatient Medications on File Prior to Visit  Medication Sig Dispense Refill  . B Complex Vitamins (VITAMIN B COMPLEX PO) Take 1 tablet by mouth daily.    . Calcium Polycarbophil (EQ FIBER LAXATIVE PO) Take 1 capsule by mouth 2 (two) times daily.    . Cholecalciferol (VITAMIN D3 PO) Take 1 capsule by mouth daily.    . fluticasone (FLONASE) 50 MCG/ACT nasal spray Place 1 spray into both nostrils daily as needed for allergies.     . Multiple Vitamins-Minerals (SENIOR MULTIVITAMIN PLUS PO) Take 1 tablet by mouth daily.    Marland Kitchen olopatadine (PATANOL) 0.1 % ophthalmic solution Place 1 drop into both eyes 2 (two) times daily as needed (as directed).    . Omega-3 Fatty Acids (FISH OIL PO) Take 1,300 mg by mouth daily.    . tamsulosin (FLOMAX) 0.4 MG CAPS capsule TAKE 1 CAPSULE DAILY 90 capsule 1   No current  facility-administered medications on file prior to visit.     BP 100/60   Pulse 69   Temp 97.8 F (36.6 C) (Oral)   Ht 5\' 10"  (1.778 m)   Wt 170 lb 4 oz (77.2 kg)   SpO2 98%   BMI 24.43 kg/m    Objective:   Physical Exam  Constitutional: He appears well-nourished.  Cardiovascular: Normal rate.  Respiratory: Effort normal.  Musculoskeletal:       Hands: Skin: Skin is warm and dry.           Assessment & Plan:  Wart:  Noted to left medial/lateral thumb. Failed numerous OTC treatments. Cryotherapy with liquid nitrogen used today. He will return in 2 weeks if no resolve/improvement. Patient tolerated well.  Pleas Koch, NP

## 2017-10-07 NOTE — Patient Instructions (Signed)
Schedule a follow up visit in 2 weeks for wart re-evaluation.   It was a pleasure to see you today!

## 2017-10-22 ENCOUNTER — Ambulatory Visit: Payer: Medicare Other | Admitting: Primary Care

## 2017-11-28 ENCOUNTER — Ambulatory Visit (INDEPENDENT_AMBULATORY_CARE_PROVIDER_SITE_OTHER): Payer: Medicare Other | Admitting: Primary Care

## 2017-11-28 DIAGNOSIS — B079 Viral wart, unspecified: Secondary | ICD-10-CM | POA: Diagnosis not present

## 2017-11-28 HISTORY — DX: Viral wart, unspecified: B07.9

## 2017-11-28 NOTE — Assessment & Plan Note (Signed)
Located to left first digit since March 2019, no improvement after one treatment of cryotherapy in July. Treated again today with liquid nitrogen. Discussed to return in 2 weeks or dermatology referral. He will update.

## 2017-11-28 NOTE — Progress Notes (Signed)
Subjective:    Patient ID: Brian Glass, male    DOB: 1950/09/23, 67 y.o.   MRN: [id number]  HPI  Brian Glass is a 67 year old male who presents today with a chief complaint of wart.   Chronic since March 2019 and located to the left hand, medial side of first digit. He was last treated in July 2019 with cryotherapy. He's tried numerous treatments OTC without improvement. Since his last visit he's not noticed improvement, no increase in size.   Review of Systems  Skin: Negative for color change.       Wart        Past Medical History:  Diagnosis Date  . Allergy   . Arthritis   . Atrial fibrillation ([place])   . Bilateral vitreous detachment   . Chickenpox   . Typical atrial flutter ([facility])      Social History   Socioeconomic History  . Marital status: Married    Spouse name: Not on file  . Number of children: Not on file  . Years of education: Not on file  . Highest education level: Not on file  Occupational History  . Not on file  Social Needs  . Financial resource strain: Not on file  . Food insecurity:    Worry: Not on file    Inability: Not on file  . Transportation needs:    Medical: Not on file    Non-medical: Not on file  Tobacco Use  . Smoking status: Never Smoker  . Smokeless tobacco: Never Used  Substance and Sexual Activity  . Alcohol use: Yes    Comment: social  . Drug use: No  . Sexual activity: Not on file  Lifestyle  . Physical activity:    Days per week: Not on file    Minutes per session: Not on file  . Stress: Not on file  Relationships  . Social connections:    Talks on phone: Not on file    Gets together: Not on file    Attends religious service: Not on file    Active member of club or organization: Not on file    Attends meetings of clubs or organizations: Not on file    Relationship status: Not on file  . Intimate partner violence:    Fear of current or ex partner: Not on file    Emotionally abused: Not on file    Physically  abused: Not on file    Forced sexual activity: Not on file  Other Topics Concern  . Not on file  Social History Narrative   Married.   3 children. 2 grandchildren.   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    Past Surgical History:  Procedure Laterality Date  . ATRIAL FIBRILLATION ABLATION N/A 05/10/2016   Procedure: Atrial Fibrillation Ablation;  Surgeon: [name], MD;  Location: [facility];  Service: Cardiovascular;  Laterality: N/A;  . CATARACT EXTRACTION, BILATERAL  2016  . TONSILLECTOMY  1955    Family History  Problem Relation Age of Onset  . Hyperlipidemia Mother   . Heart disease Mother   . Hypertension Mother   . Hyperlipidemia Father   . Heart disease Father   . Stroke Father   . Hypertension Father   . Heart attack Father   . Heart disease Brother   . Prostate cancer Brother   . Atrial fibrillation Brother   . Sleep apnea Brother     Allergies  Allergen  Reactions  . Sudafed [Pseudoephedrine Hcl]     Hard to urinate     Current Outpatient Medications on File Prior to Visit  Medication Sig Dispense Refill  . B Complex Vitamins (VITAMIN B COMPLEX PO) Take 1 tablet by mouth daily.    . Calcium Polycarbophil (EQ FIBER LAXATIVE PO) Take 1 capsule by mouth 2 (two) times daily.    . Cholecalciferol (VITAMIN D3 PO) Take 1 capsule by mouth daily.    . fluticasone (FLONASE) 50 MCG/ACT nasal spray Place 1 spray into both nostrils daily as needed for allergies.     . Multiple Vitamins-Minerals (SENIOR MULTIVITAMIN PLUS PO) Take 1 tablet by mouth daily.    Marland Kitchen olopatadine (PATANOL) 0.1 % ophthalmic solution Place 1 drop into both eyes 2 (two) times daily as needed (as directed).    . Omega-3 Fatty Acids (FISH OIL PO) Take 1,300 mg by mouth daily.    . tamsulosin (FLOMAX) 0.4 MG CAPS capsule TAKE 1 CAPSULE DAILY 90 capsule 1   No current facility-administered medications on file prior to visit.     BP 116/70   Pulse 70   Temp 97.9 F  (36.6 C) (Oral)   Ht 5\' 10"  (1.778 m)   Wt 169 lb 8 oz (76.9 kg)   SpO2 98%   BMI 24.32 kg/m    Objective:   Physical Exam  Constitutional: He appears well-nourished.  Skin: Skin is warm and dry. No erythema.  Pinpoint raised flesh colored lesion to left medial first digit. No erythema.           Assessment & Plan:

## 2017-11-28 NOTE — Patient Instructions (Signed)
You are welcome to return in two weeks for repeat treatment or we can send you to dermatology.  It was a pleasure to see you today!

## 2017-12-11 ENCOUNTER — Ambulatory Visit (INDEPENDENT_AMBULATORY_CARE_PROVIDER_SITE_OTHER): Payer: Medicare Other | Admitting: Primary Care

## 2017-12-11 ENCOUNTER — Encounter: Payer: Self-pay | Admitting: Primary Care

## 2017-12-11 VITALS — BP 110/60 | HR 66 | Temp 98.0°F | Ht 70.0 in | Wt 169.5 lb

## 2017-12-11 DIAGNOSIS — Z23 Encounter for immunization: Secondary | ICD-10-CM

## 2017-12-11 DIAGNOSIS — B079 Viral wart, unspecified: Secondary | ICD-10-CM | POA: Diagnosis not present

## 2017-12-11 NOTE — Addendum Note (Signed)
Addended by: Jacqualin Combes on: 12/11/2017 04:38 PM   Modules accepted: Orders

## 2017-12-11 NOTE — Progress Notes (Signed)
Subjective:    Patient ID: Brian Glass, male    DOB: [birth year], 67 y.o.   MRN: [id number]  HPI  Brian Glass is a 67 year old male who presents today for follow up of wart.   Chronic to left first digit since March 2019. He has been treated with cryotherapy in March and again two weeks ago.   Since his last visit he's noticed blistering and bluish color to the site. The wart has firmed up significantly since. He denies pain or any other complaints.  Review of Systems  Constitutional: Negative for fever.  Skin:       Wart        Past Medical History:  Diagnosis Date  . Allergy   . Arthritis   . Atrial fibrillation ([place])   . Bilateral vitreous detachment   . Chickenpox   . Typical atrial flutter ([facility])      Social History   Socioeconomic History  . Marital status: Married    Spouse name: Not on file  . Number of children: Not on file  . Years of education: Not on file  . Highest education level: Not on file  Occupational History  . Not on file  Social Needs  . Financial resource strain: Not on file  . Food insecurity:    Worry: Not on file    Inability: Not on file  . Transportation needs:    Medical: Not on file    Non-medical: Not on file  Tobacco Use  . Smoking status: Never Smoker  . Smokeless tobacco: Never Used  Substance and Sexual Activity  . Alcohol use: Yes    Comment: social  . Drug use: No  . Sexual activity: Not on file  Lifestyle  . Physical activity:    Days per week: Not on file    Minutes per session: Not on file  . Stress: Not on file  Relationships  . Social connections:    Talks on phone: Not on file    Gets together: Not on file    Attends religious service: Not on file    Active member of club or organization: Not on file    Attends meetings of clubs or organizations: Not on file    Relationship status: Not on file  . Intimate partner violence:    Fear of current or ex partner: Not on file    Emotionally abused: Not on file     Physically abused: Not on file    Forced sexual activity: Not on file  Other Topics Concern  . Not on file  Social History Narrative   Married.   3 children. 2 grandchildren.   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    Past Surgical History:  Procedure Laterality Date  . ATRIAL FIBRILLATION ABLATION N/A 05/10/2016   Procedure: Atrial Fibrillation Ablation;  Surgeon: [name], MD;  Location: [facility];  Service: Cardiovascular;  Laterality: N/A;  . CATARACT EXTRACTION, BILATERAL  2016  . TONSILLECTOMY  1955    Family History  Problem Relation Age of Onset  . Hyperlipidemia Mother   . Heart disease Mother   . Hypertension Mother   . Hyperlipidemia Father   . Heart disease Father   . Stroke Father   . Hypertension Father   . Heart attack Father   . Heart disease Brother   . Prostate cancer Brother   . Atrial fibrillation Brother   . Sleep apnea Brother  Allergies  Allergen Reactions  . Sudafed [Pseudoephedrine Hcl]     Hard to urinate     Current Outpatient Medications on File Prior to Visit  Medication Sig Dispense Refill  . B Complex Vitamins (VITAMIN B COMPLEX PO) Take 1 tablet by mouth daily.    . Calcium Polycarbophil (EQ FIBER LAXATIVE PO) Take 1 capsule by mouth 2 (two) times daily.    . Cholecalciferol (VITAMIN D3 PO) Take 1 capsule by mouth daily.    . fluticasone (FLONASE) 50 MCG/ACT nasal spray Place 1 spray into both nostrils daily as needed for allergies.     . Multiple Vitamins-Minerals (SENIOR MULTIVITAMIN PLUS PO) Take 1 tablet by mouth daily.    Marland Kitchen olopatadine (PATANOL) 0.1 % ophthalmic solution Place 1 drop into both eyes 2 (two) times daily as needed (as directed).    . Omega-3 Fatty Acids (FISH OIL PO) Take 1,300 mg by mouth daily.    . tamsulosin (FLOMAX) 0.4 MG CAPS capsule TAKE 1 CAPSULE DAILY 90 capsule 1   No current facility-administered medications on file prior to visit.     BP 110/60   Pulse 66    Temp 98 F (36.7 C) (Oral)   Ht 5\' 10"  (1.778 m)   Wt 169 lb 8 oz (76.9 kg)   SpO2 98%   BMI 24.32 kg/m    Objective:   Physical Exam  Constitutional: He appears well-nourished. No distress.  Skin: Skin is warm and dry.  1 cm area of dark blue colored blister surrounding wart. Wart is firm. Non tender.            Assessment & Plan:

## 2017-12-11 NOTE — Patient Instructions (Signed)
Please call or message me in two weeks with an update. You can upload a picture to my chart.  It was a pleasure to see you today!

## 2017-12-11 NOTE — Assessment & Plan Note (Signed)
Appears to be healing. Hard to tell if wart has been destructed. He will update via My Chart or phone in 2 weeks. Consider dermatology evaluation if no resolve.

## 2017-12-31 DIAGNOSIS — B079 Viral wart, unspecified: Secondary | ICD-10-CM

## 2018-01-31 DIAGNOSIS — D485 Neoplasm of uncertain behavior of skin: Secondary | ICD-10-CM | POA: Diagnosis not present

## 2018-01-31 DIAGNOSIS — D1800 Hemangioma unspecified site: Secondary | ICD-10-CM | POA: Diagnosis not present

## 2018-01-31 DIAGNOSIS — L821 Other seborrheic keratosis: Secondary | ICD-10-CM | POA: Diagnosis not present

## 2018-01-31 DIAGNOSIS — B078 Other viral warts: Secondary | ICD-10-CM | POA: Diagnosis not present

## 2018-01-31 DIAGNOSIS — L812 Freckles: Secondary | ICD-10-CM | POA: Diagnosis not present

## 2018-01-31 DIAGNOSIS — L570 Actinic keratosis: Secondary | ICD-10-CM | POA: Diagnosis not present

## 2018-01-31 DIAGNOSIS — Z85828 Personal history of other malignant neoplasm of skin: Secondary | ICD-10-CM | POA: Diagnosis not present

## 2018-02-19 DIAGNOSIS — L570 Actinic keratosis: Secondary | ICD-10-CM | POA: Diagnosis not present

## 2018-03-19 DIAGNOSIS — C61 Malignant neoplasm of prostate: Secondary | ICD-10-CM

## 2018-03-19 HISTORY — DX: Malignant neoplasm of prostate: C61

## 2018-03-27 DIAGNOSIS — L570 Actinic keratosis: Secondary | ICD-10-CM | POA: Diagnosis not present

## 2018-03-27 DIAGNOSIS — B078 Other viral warts: Secondary | ICD-10-CM | POA: Diagnosis not present

## 2018-04-09 ENCOUNTER — Other Ambulatory Visit: Payer: Self-pay | Admitting: Primary Care

## 2018-04-09 DIAGNOSIS — N400 Enlarged prostate without lower urinary tract symptoms: Secondary | ICD-10-CM

## 2018-04-14 DIAGNOSIS — L570 Actinic keratosis: Secondary | ICD-10-CM | POA: Diagnosis not present

## 2018-07-31 DIAGNOSIS — D2260 Melanocytic nevi of unspecified upper limb, including shoulder: Secondary | ICD-10-CM | POA: Diagnosis not present

## 2018-07-31 DIAGNOSIS — Z1283 Encounter for screening for malignant neoplasm of skin: Secondary | ICD-10-CM | POA: Diagnosis not present

## 2018-07-31 DIAGNOSIS — D2230 Melanocytic nevi of unspecified part of face: Secondary | ICD-10-CM | POA: Diagnosis not present

## 2018-07-31 DIAGNOSIS — D2272 Melanocytic nevi of left lower limb, including hip: Secondary | ICD-10-CM | POA: Diagnosis not present

## 2018-07-31 DIAGNOSIS — D225 Melanocytic nevi of trunk: Secondary | ICD-10-CM | POA: Diagnosis not present

## 2018-07-31 DIAGNOSIS — D1800 Hemangioma unspecified site: Secondary | ICD-10-CM | POA: Diagnosis not present

## 2018-07-31 DIAGNOSIS — Z85828 Personal history of other malignant neoplasm of skin: Secondary | ICD-10-CM | POA: Diagnosis not present

## 2018-07-31 DIAGNOSIS — L812 Freckles: Secondary | ICD-10-CM | POA: Diagnosis not present

## 2018-07-31 DIAGNOSIS — L821 Other seborrheic keratosis: Secondary | ICD-10-CM | POA: Diagnosis not present

## 2018-07-31 DIAGNOSIS — L570 Actinic keratosis: Secondary | ICD-10-CM | POA: Diagnosis not present

## 2018-09-09 ENCOUNTER — Other Ambulatory Visit: Payer: Self-pay | Admitting: Primary Care

## 2018-09-09 DIAGNOSIS — N400 Enlarged prostate without lower urinary tract symptoms: Secondary | ICD-10-CM

## 2018-09-25 ENCOUNTER — Other Ambulatory Visit: Payer: Self-pay | Admitting: Primary Care

## 2018-09-25 DIAGNOSIS — Z13220 Encounter for screening for lipoid disorders: Secondary | ICD-10-CM

## 2018-09-25 DIAGNOSIS — Z125 Encounter for screening for malignant neoplasm of prostate: Secondary | ICD-10-CM

## 2018-09-25 DIAGNOSIS — I480 Paroxysmal atrial fibrillation: Secondary | ICD-10-CM

## 2018-09-29 ENCOUNTER — Other Ambulatory Visit: Payer: Self-pay

## 2018-09-29 ENCOUNTER — Other Ambulatory Visit (INDEPENDENT_AMBULATORY_CARE_PROVIDER_SITE_OTHER): Payer: Medicare Other

## 2018-09-29 DIAGNOSIS — Z13220 Encounter for screening for lipoid disorders: Secondary | ICD-10-CM | POA: Diagnosis not present

## 2018-09-29 DIAGNOSIS — Z125 Encounter for screening for malignant neoplasm of prostate: Secondary | ICD-10-CM

## 2018-09-29 DIAGNOSIS — I480 Paroxysmal atrial fibrillation: Secondary | ICD-10-CM

## 2018-09-29 LAB — COMPREHENSIVE METABOLIC PANEL
ALT: 16 U/L (ref 0–53)
AST: 18 U/L (ref 0–37)
Albumin: 4 g/dL (ref 3.5–5.2)
Alkaline Phosphatase: 83 U/L (ref 39–117)
BUN: 20 mg/dL (ref 6–23)
CO2: 30 mEq/L (ref 19–32)
Calcium: 8.7 mg/dL (ref 8.4–10.5)
Chloride: 105 mEq/L (ref 96–112)
Creatinine, Ser: 1.07 mg/dL (ref 0.40–1.50)
GFR: 68.77 mL/min (ref >60.00)
Glucose, Bld: 87 mg/dL (ref 70–99)
Potassium: 4.3 mEq/L (ref 3.5–5.1)
Sodium: 140 mEq/L (ref 135–145)
Total Bilirubin: 1.2 mg/dL (ref 0.2–1.2)
Total Protein: 6 g/dL (ref 6.0–8.3)

## 2018-09-29 LAB — LIPID PANEL
Cholesterol: 168 mg/dL (ref 0–200)
HDL: 53.6 mg/dL (ref >39.00)
LDL Cholesterol: 103 mg/dL — ABNORMAL HIGH (ref 0–99)
NonHDL: 114.69
Total CHOL/HDL Ratio: 3
Triglycerides: 60 mg/dL (ref 0.0–149.0)
VLDL: 12 mg/dL (ref 0.0–40.0)

## 2018-09-29 LAB — PSA, MEDICARE: PSA: 5.93 ng/ml — ABNORMAL HIGH (ref 0.10–4.00)

## 2018-10-01 ENCOUNTER — Ambulatory Visit (INDEPENDENT_AMBULATORY_CARE_PROVIDER_SITE_OTHER): Payer: Medicare Other | Admitting: Primary Care

## 2018-10-01 ENCOUNTER — Encounter: Payer: Self-pay | Admitting: Primary Care

## 2018-10-01 ENCOUNTER — Other Ambulatory Visit: Payer: Self-pay

## 2018-10-01 VITALS — BP 116/72 | HR 66 | Temp 98.0°F | Ht 70.0 in | Wt 168.5 lb

## 2018-10-01 DIAGNOSIS — Z1211 Encounter for screening for malignant neoplasm of colon: Secondary | ICD-10-CM | POA: Diagnosis not present

## 2018-10-01 DIAGNOSIS — Z0000 Encounter for general adult medical examination without abnormal findings: Secondary | ICD-10-CM

## 2018-10-01 DIAGNOSIS — I480 Paroxysmal atrial fibrillation: Secondary | ICD-10-CM

## 2018-10-01 DIAGNOSIS — R3912 Poor urinary stream: Secondary | ICD-10-CM | POA: Diagnosis not present

## 2018-10-01 DIAGNOSIS — N401 Enlarged prostate with lower urinary tract symptoms: Secondary | ICD-10-CM

## 2018-10-01 DIAGNOSIS — R972 Elevated prostate specific antigen [PSA]: Secondary | ICD-10-CM | POA: Diagnosis not present

## 2018-10-01 NOTE — Assessment & Plan Note (Signed)
No recent follow up with cardiology. History of ablation.  Exam today with regular rate and rhythm.  Continue off of Eliquis.

## 2018-10-01 NOTE — Assessment & Plan Note (Addendum)
Immunizations UTD. PSA UTD, Urology referral placed. Colonoscopy due, referral placed. Encouraged regular exercise, healthy diet. Exam stable. Labs reviewed.   I have personally reviewed and have noted: 1. The patient's medical and social history 2. Their use of alcohol, tobacco or illicit drugs 3. Their current medications and supplements 4. The patient's functional ability including ADL's, fall risks, home  safety risks and hearing or visual impairment. 5. Diet and physical activities 6. Evidence for depression or mood disorder

## 2018-10-01 NOTE — Assessment & Plan Note (Signed)
Appears to be symptomatic today on Flomax.  Recent elevation in PSA, history of this as well. He does have BPH symptoms so this could be the reason, but given prior elevations and this recent elevations we agee that Urology evaluation is warranted.  Referral placed.

## 2018-10-01 NOTE — Progress Notes (Signed)
Patient ID: Brian Glass, male   DOB: May 01, 1950, 68 y.o.   MRN: [id number]  HPI: Brian Glass is a 68 year old male who is here for [place].  Past Medical History:  Diagnosis Date  . Allergy   . Arthritis   . Atrial fibrillation ([place])   . Bilateral vitreous detachment   . Chickenpox   . Hx of basal cell carcinoma 2018   L malar cheek  . Typical atrial flutter ([facility])     Current Outpatient Medications  Medication Sig Dispense Refill  . B Complex Vitamins (VITAMIN B COMPLEX PO) Take 1 tablet by mouth daily.    . Calcium Polycarbophil (EQ FIBER LAXATIVE PO) Take 1 capsule by mouth 2 (two) times daily.    . Cholecalciferol (VITAMIN D3 PO) Take 1 capsule by mouth daily.    . fluticasone (FLONASE) 50 MCG/ACT nasal spray Place 1 spray into both nostrils daily as needed for allergies.     . Multiple Vitamins-Minerals (SENIOR MULTIVITAMIN PLUS PO) Take 1 tablet by mouth daily.    Marland Kitchen olopatadine (PATANOL) 0.1 % ophthalmic solution Place 1 drop into both eyes 2 (two) times daily as needed (as directed).    . Omega-3 Fatty Acids (FISH OIL PO) Take 1,300 mg by mouth daily.    . tamsulosin (FLOMAX) 0.4 MG CAPS capsule TAKE 1 CAPSULE DAILY 90 capsule 1   No current facility-administered medications for this visit.     Allergies  Allergen Reactions  . Sudafed [Pseudoephedrine Hcl]     Hard to urinate     Family History  Problem Relation Age of Onset  . Hyperlipidemia Mother   . Heart disease Mother   . Hypertension Mother   . Hyperlipidemia Father   . Heart disease Father   . Stroke Father   . Hypertension Father   . Heart attack Father   . Heart disease Brother   . Prostate cancer Brother   . Atrial fibrillation Brother   . Sleep apnea Brother     Social History   Socioeconomic History  . Marital status: Married    Spouse name: Dorian Pod  . Number of children: Not on file  . Years of education: Not on file  . Highest education level: Not on file  Occupational History  . Not on  file  Social Needs  . Financial resource strain: Not on file  . Food insecurity    Worry: Not on file    Inability: Not on file  . Transportation needs    Medical: Not on file    Non-medical: Not on file  Tobacco Use  . Smoking status: Never Smoker  . Smokeless tobacco: Never Used  Substance and Sexual Activity  . Alcohol use: Yes    Comment: social  . Drug use: No  . Sexual activity: Not on file  Lifestyle  . Physical activity    Days per week: Not on file    Minutes per session: Not on file  . Stress: Not on file  Relationships  . Social Herbalist on phone: Not on file    Gets together: Not on file    Attends religious service: Not on file    Active member of club or organization: Not on file    Attends meetings of clubs or organizations: Not on file    Relationship status: Not on file  . Intimate partner violence    Fear of current or ex partner: Not on file    Emotionally  abused: Not on file    Physically abused: Not on file    Forced sexual activity: Not on file  Other Topics Concern  . Not on file  Social History Narrative   Married.   3 children. 2 grandchildren.   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    Hospitiliaztions: None  Health Maintenance:    Flu: Due this season   Tetanus: Completed in 2014  Pneumovax: Completed in 2013  Prevnar: Completed in 2018  Zostavax: Completed in 2014  Colonoscopy: Due  Eye Doctor: Completed 2 years ago  Dental Exam: Completes annually   PSA: 5.93 in 2020  Hep C Screening: Negative    Providers: Alma Friendly, PCP; Dentist    I have personally reviewed and have noted: 1. The patient's medical and social history 2. Their use of alcohol, tobacco or illicit drugs 3. Their current medications and supplements 4. The patient's functional ability including ADL's, fall risks, home  safety risks and hearing or visual impairment. 5. Diet and physical activities 6. Evidence for  depression or mood disorder  Subjective:   Review of Systems:   Constitutional: Denies fever, malaise, fatigue, headache or abrupt weight changes.  HEENT: Denies eye pain, eye redness, ear pain, ringing in the ears, wax buildup, runny nose, nasal congestion, bloody nose, or sore throat. Respiratory: Denies difficulty breathing, shortness of breath, cough or sputum production.   Cardiovascular: Denies chest pain, chest tightness, palpitations or swelling in the hands or feet.  Gastrointestinal: Denies abdominal pain, bloating, constipation, diarrhea or blood in the stool.  GU: Continues with difficulty urinating despite taking Flomax.  Musculoskeletal: Denies decrease in range of motion, difficulty with gait, muscle pain or joint pain and swelling.  Skin: Denies redness, rashes, lesions or ulcercations.  Neurological: Denies dizziness, difficulty with memory, difficulty with speech or problems with balance and coordination.  Psychiatric: Denies concerns for anxiety or depression.   No other specific complaints in a complete review of systems (except as listed in HPI above).  Objective:  PE:   There were no vitals taken for this visit. Wt Readings from Last 3 Encounters:  12/11/17 169 lb 8 oz (76.9 kg)  11/28/17 169 lb 8 oz (76.9 kg)  10/07/17 170 lb 4 oz (77.2 kg)    General: Appears their stated age, well developed, well nourished in NAD. Skin: Warm, dry and intact. No rashes, lesions or ulcerations noted. HEENT: Head: normal shape and size; Eyes: sclera white, no icterus, conjunctiva pink, PERRLA and EOMs intact; Ears: Tm's gray and intact, normal light reflex; Nose: mucosa pink and moist, septum midline; Throat/Mouth: Teeth present, mucosa pink and moist, no exudate, lesions or ulcerations noted.  Neck: Normal range of motion. Neck supple, trachea midline. No massses, lumps or thyromegaly present.  Cardiovascular: Normal rate and rhythm. S1,S2 noted.  No murmur, rubs or gallops  noted. No JVD or BLE edema. No carotid bruits noted. Pulmonary/Chest: Normal effort and positive vesicular breath sounds. No respiratory distress. No wheezes, rales or ronchi noted.  Abdomen: Soft and nontender. Normal bowel sounds, no bruits noted. No distention or masses noted. Liver, spleen and kidneys non palpable. Musculoskeletal: Normal range of motion. No signs of joint swelling. No difficulty with gait.  Neurological: Alert and oriented. Cranial nerves II-XII intact. Coordination normal. +DTRs bilaterally. Psychiatric: Mood and affect normal. Behavior is normal. Judgment and thought content normal.   BMET    Component Value Date/Time   NA 140 09/29/2018 0815  NA 143 04/24/2016 0826   K 4.3 09/29/2018 0815   CL 105 09/29/2018 0815   CO2 30 09/29/2018 0815   GLUCOSE 87 09/29/2018 0815   BUN 20 09/29/2018 0815   BUN 20 04/24/2016 0826   CREATININE 1.07 09/29/2018 0815   CALCIUM 8.7 09/29/2018 0815   GFRNONAA >60 05/10/2016 0601   GFRAA >60 05/10/2016 0601    Lipid Panel     Component Value Date/Time   CHOL 168 09/29/2018 0815   TRIG 60.0 09/29/2018 0815   HDL 53.60 09/29/2018 0815   CHOLHDL 3 09/29/2018 0815   VLDL 12.0 09/29/2018 0815   LDLCALC 103 (H) 09/29/2018 0815    CBC    Component Value Date/Time   WBC 4.3 05/10/2016 0601   RBC 4.60 05/10/2016 0601   HGB 14.3 05/10/2016 0601   HGB 13.8 04/24/2016 0826   HCT 39.5 05/10/2016 0601   HCT 38.2 04/24/2016 0826   PLT 131 (L) 05/10/2016 0601   PLT 128 (L) 04/24/2016 0826   MCV 85.9 05/10/2016 0601   MCV 86 04/24/2016 0826   MCH 31.1 05/10/2016 0601   MCHC 36.2 (H) 05/10/2016 0601   RDW 12.5 05/10/2016 0601   RDW 13.1 04/24/2016 0826   LYMPHSABS 1.4 04/24/2016 0826   EOSABS 0.1 04/24/2016 0826   BASOSABS 0.0 04/24/2016 0826    Hgb A1C No results found for: HGBA1C    Assessment and Plan:   Medicare Annual Wellness Visit:  Diet: He endorses a healthy diet with plenty of vegetables, fiber, whole  grains. Physical activity: Active at home, exercising regularly. Depression/mood screen: Negative Hearing: Intact to whispered voice Visual acuity: Grossly normal, performs annual eye exam  ADLs: Capable Fall risk: None Home safety: Good Cognitive evaluation: Intact to orientation, naming, recall and repetition EOL planning: Adv directives, full code/ I agree  Preventative Medicine: Immunizations UTD. PSA UTD, Urology referral placed. Colonoscopy due, referral placed. Encouraged regular exercise, healthy diet. Exam stable. Labs reviewed.    Next appointment: One year.

## 2018-10-01 NOTE — Patient Instructions (Signed)
Continue exercising. You should be getting 150 minutes of moderate intensity exercise weekly.  Continue to eat a healthy diet.  Ensure you are consuming 64 ounces of water daily.  You will be contacted regarding your referral to GI for the colonoscopy and Urology for the prostate.  Please let us know if you have not been contacted within one week.   It was a pleasure to see you today!

## 2018-10-07 ENCOUNTER — Telehealth: Payer: Self-pay

## 2018-10-07 NOTE — Telephone Encounter (Signed)
Returned patients call LVM for him to call back to schedule his colonoscopy.  Thanks [employer]

## 2018-10-10 ENCOUNTER — Other Ambulatory Visit: Payer: Self-pay

## 2018-10-10 ENCOUNTER — Telehealth: Payer: Self-pay

## 2018-10-10 DIAGNOSIS — Z1211 Encounter for screening for malignant neoplasm of colon: Secondary | ICD-10-CM

## 2018-10-10 MED ORDER — NA SULFATE-K SULFATE-MG SULF 17.5-3.13-1.6 GM/177ML PO SOLN: 1.0000 | Freq: Once | ORAL | 0 refills | Status: AC

## 2018-10-10 NOTE — Telephone Encounter (Signed)
Gastroenterology Pre-Procedure Review  Request Date: 10/20/18 Requesting Physician: Dr. Marius Ditch  PATIENT REVIEW QUESTIONS: The patient responded to the following health history questions as indicated:    1. Are you having any GI issues? yes (change in bowel habits) Patient informed he had a torturous colon 10 years ago on last colon 2. Do you have a personal history of Polyps? no 3. Do you have a family history of Colon Cancer or Polyps? no 4. Diabetes Mellitus? no 5. Joint replacements in the past 12 months?no 6. Major health problems in the past 3 months?no 7. Any artificial heart valves, MVP, or defibrillator?Cardiac Ablation 2 years ago    MEDICATIONS & ALLERGIES:    Patient reports the following regarding taking any anticoagulation/antiplatelet therapy:   Plavix, Coumadin, Eliquis, Xarelto, Lovenox, Pradaxa, Brilinta, or Effient? no Aspirin? no  Patient confirms/reports the following medications:  Current Outpatient Medications  Medication Sig Dispense Refill  . B Complex Vitamins (VITAMIN B COMPLEX PO) Take 1 tablet by mouth daily.    . Calcium Polycarbophil (EQ FIBER LAXATIVE PO) Take 1 capsule by mouth 2 (two) times daily.    . Cholecalciferol (VITAMIN D3 PO) Take 1 capsule by mouth daily.    . fluticasone (FLONASE) 50 MCG/ACT nasal spray Place 1 spray into both nostrils daily as needed for allergies.     . Multiple Vitamins-Minerals (SENIOR MULTIVITAMIN PLUS PO) Take 1 tablet by mouth daily.    Marland Kitchen olopatadine (PATANOL) 0.1 % ophthalmic solution Place 1 drop into both eyes 2 (two) times daily as needed (as directed).    . Omega-3 Fatty Acids (FISH OIL PO) Take 1,300 mg by mouth daily.    . tamsulosin (FLOMAX) 0.4 MG CAPS capsule TAKE 1 CAPSULE DAILY 90 capsule 1   No current facility-administered medications for this visit.     Patient confirms/reports the following allergies:  Allergies  Allergen Reactions  . Sudafed [Pseudoephedrine Hcl]     Hard to urinate     No  orders of the defined types were placed in this encounter.   AUTHORIZATION INFORMATION Primary Insurance: 1D#: Group #:  Secondary Insurance: 1D#: Group #:  SCHEDULE INFORMATION: Date: 10/20/18 Time: Location:[facility]

## 2018-10-16 ENCOUNTER — Other Ambulatory Visit
Admission: RE | Admit: 2018-10-16 | Discharge: 2018-10-16 | Disposition: A | Discharge status: Home / Self Care | Payer: Medicare Other | Source: Ambulatory Visit | Attending: Gastroenterology | Admitting: Gastroenterology

## 2018-10-16 ENCOUNTER — Other Ambulatory Visit: Payer: Self-pay

## 2018-10-16 DIAGNOSIS — Z20828 Contact with and (suspected) exposure to other viral communicable diseases: Secondary | ICD-10-CM | POA: Insufficient documentation

## 2018-10-16 LAB — SARS CORONAVIRUS 2 (TAT 6-24 HRS): SARS Coronavirus 2: NEGATIVE

## 2018-10-20 ENCOUNTER — Ambulatory Visit: Payer: Medicare Other | Admitting: Anesthesiology

## 2018-10-20 ENCOUNTER — Encounter
Admission: RE | Disposition: A | Discharge status: Home / Self Care | Payer: Self-pay | Source: Home / Self Care | Attending: Gastroenterology

## 2018-10-20 ENCOUNTER — Other Ambulatory Visit: Payer: Self-pay

## 2018-10-20 ENCOUNTER — Ambulatory Visit
Admission: RE | Admit: 2018-10-20 | Discharge: 2018-10-20 | Disposition: A | Discharge status: Home / Self Care | Payer: Medicare Other | Attending: Gastroenterology | Admitting: Gastroenterology

## 2018-10-20 ENCOUNTER — Encounter: Payer: Self-pay | Admitting: *Deleted

## 2018-10-20 DIAGNOSIS — K644 Residual hemorrhoidal skin tags: Secondary | ICD-10-CM | POA: Insufficient documentation

## 2018-10-20 DIAGNOSIS — I480 Paroxysmal atrial fibrillation: Secondary | ICD-10-CM | POA: Diagnosis not present

## 2018-10-20 DIAGNOSIS — D126 Benign neoplasm of colon, unspecified: Secondary | ICD-10-CM | POA: Diagnosis not present

## 2018-10-20 DIAGNOSIS — Z79899 Other long term (current) drug therapy: Secondary | ICD-10-CM | POA: Insufficient documentation

## 2018-10-20 DIAGNOSIS — K648 Other hemorrhoids: Secondary | ICD-10-CM | POA: Diagnosis not present

## 2018-10-20 DIAGNOSIS — Z1211 Encounter for screening for malignant neoplasm of colon: Secondary | ICD-10-CM | POA: Diagnosis not present

## 2018-10-20 DIAGNOSIS — Z85828 Personal history of other malignant neoplasm of skin: Secondary | ICD-10-CM | POA: Diagnosis not present

## 2018-10-20 DIAGNOSIS — D122 Benign neoplasm of ascending colon: Secondary | ICD-10-CM | POA: Diagnosis not present

## 2018-10-20 DIAGNOSIS — I4891 Unspecified atrial fibrillation: Secondary | ICD-10-CM | POA: Diagnosis not present

## 2018-10-20 DIAGNOSIS — K635 Polyp of colon: Secondary | ICD-10-CM

## 2018-10-20 HISTORY — PX: COLONOSCOPY WITH PROPOFOL: SHX5780

## 2018-10-20 SURGERY — COLONOSCOPY WITH PROPOFOL
Anesthesia: General

## 2018-10-20 MED ORDER — SODIUM CHLORIDE 0.9 % IV SOLN
INTRAVENOUS | Status: DC
  Administered 2018-10-20: 1000 mL via INTRAVENOUS

## 2018-10-20 MED ORDER — PROPOFOL 500 MG/50ML IV EMUL
INTRAVENOUS | Status: DC | PRN
  Administered 2018-10-20: 120 ug/kg/min via INTRAVENOUS

## 2018-10-20 MED ORDER — PROPOFOL 10 MG/ML IV BOLUS
INTRAVENOUS | Status: DC | PRN
  Administered 2018-10-20: 80 mg via INTRAVENOUS

## 2018-10-20 MED ORDER — PROPOFOL 500 MG/50ML IV EMUL
INTRAVENOUS | Status: AC
  Filled 2018-10-20: qty 50

## 2018-10-20 NOTE — Op Note (Signed)
[facility] Gastroenterology Patient Name: Brian Glass Procedure Date: 10/20/2018 9:22 AM MRN: [id number] Account #: [id number] Date of Birth: [birth year] Admit Type: Outpatient Age: 68 Room: [facility] ENDO ROOM 2 Gender: Male Note Status: Finalized Procedure:            Colonoscopy Indications:          Screening for colorectal malignant neoplasm, Last                        colonoscopy 10 years ago Providers:            Lin Landsman MD, MD Referring MD:         Pleas Koch (Referring MD) Medicines:            Monitored Anesthesia Care Complications:        No immediate complications. Estimated blood loss: None. Procedure:            Pre-Anesthesia Assessment:                       - Prior to the procedure, a History and Physical was                        performed, and patient medications and allergies were                        reviewed. The patient is competent. The risks and                        benefits of the procedure and the sedation options and                        risks were discussed with the patient. All questions                        were answered and informed consent was obtained.                        Patient identification and proposed procedure were                        verified by the physician, the nurse, the                        anesthesiologist, the anesthetist and the technician in                        the pre-procedure area in the procedure room in the                        endoscopy suite. Mental Status Examination: alert and                        oriented. Airway Examination: normal oropharyngeal                        airway and neck mobility. Respiratory Examination:                        clear to auscultation. CV Examination: normal.  Prophylactic Antibiotics: The patient does not require                        prophylactic antibiotics. Prior Anticoagulants: The   patient has taken no previous anticoagulant or                        antiplatelet agents. ASA Grade Assessment: III - A                        patient with severe systemic disease. After reviewing                        the risks and benefits, the patient was deemed in                        satisfactory condition to undergo the procedure. The                        anesthesia plan was to use monitored anesthesia care                        (MAC). Immediately prior to administration of                        medications, the patient was re-assessed for adequacy                        to receive sedatives. The heart rate, respiratory rate,                        oxygen saturations, blood pressure, adequacy of                        pulmonary ventilation, and response to care were                        monitored throughout the procedure. The physical status                        of the patient was re-assessed after the procedure.                       After obtaining informed consent, the colonoscope was                        passed under direct vision. Throughout the procedure,                        the patient's blood pressure, pulse, and oxygen                        saturations were monitored continuously. The                        Colonoscope was introduced through the anus and                        advanced to the the cecum, identified by appendiceal  orifice and ileocecal valve. The colonoscopy was                        performed without difficulty. The patient tolerated the                        procedure well. The quality of the bowel preparation                        was evaluated using the BBPS [facility] Bowel Preparation                        Scale) with scores of: Right Colon = 3, Transverse                        Colon = 3 and Left Colon = 3 (entire mucosa seen well                        with no residual staining, small fragments of stool or                         opaque liquid). The total BBPS score equals 9. Findings:      The perianal and digital rectal examinations were normal. Pertinent       negatives include normal sphincter tone and no palpable rectal lesions.      A 9 mm polyp was found in the ascending colon. The polyp was sessile.       The polyp was removed with a hot snare. Resection and retrieval were       complete. To prevent bleeding after the polypectomy, one hemostatic clip       was successfully placed (MR conditional). There was no bleeding during,       or at the end, of the procedure.      Non-bleeding external and internal hemorrhoids were found during       retroflexion. The hemorrhoids were large. Impression:           - One 9 mm polyp in the ascending colon, removed with a                        hot snare. Resected and retrieved. Clip (MR                        conditional) was placed.                       - Non-bleeding external and internal hemorrhoids. Recommendation:       - Discharge patient to home (with escort).                       - Resume previous diet today.                       - Continue present medications.                       - Await pathology results.                       - Repeat colonoscopy in 7 years for surveillance. Procedure Code(s):    ---  Professional ---                       [id number], Colonoscopy, flexible; with removal of tumor(s),                        polyp(s), or other lesion(s) by snare technique Diagnosis Code(s):    --- Professional ---                       Z12.11, Encounter for screening for malignant neoplasm                        of colon                       K63.5, Polyp of colon                       K64.8, Other hemorrhoids CPT copyright 2019 American Medical Association. All rights reserved. The codes documented in this report are preliminary and upon coder review may  be revised to meet current compliance requirements. Dr. Ulyess Mort Lin Landsman  MD, MD 10/20/2018 10:21:07 AM This report has been signed electronically. Number of Addenda: 0 Note Initiated On: 10/20/2018 9:22 AM Scope Withdrawal Time: 0 hours 10 minutes 24 seconds  Total Procedure Duration: 0 hours 17 minutes 20 seconds  Estimated Blood Loss: Estimated blood loss: none.      [facility]

## 2018-10-20 NOTE — H&P (Signed)
Cephas Darby, MD [address]  [place]  [place],  [address]  Main: [phone]  Fax: [phone] Pager: [phone]  Primary Care Physician:  Pleas Koch, NP Primary Gastroenterologist:  Dr. Cephas Darby  Pre-Procedure History & Physical: HPI:  Brian Glass is a 68 y.o. male is here for an colonoscopy.   Past Medical History:  Diagnosis Date  . Allergy   . Arthritis   . Atrial fibrillation ([place])   . Bilateral vitreous detachment   . Chickenpox   . Hx of basal cell carcinoma 2018   L malar cheek  . Typical atrial flutter [facility])     Past Surgical History:  Procedure Laterality Date  . ATRIAL FIBRILLATION ABLATION N/A 05/10/2016   Procedure: Atrial Fibrillation Ablation;  Surgeon: [name], MD;  Location: [place] [facility];  Service: Cardiovascular;  Laterality: N/A;  . CATARACT EXTRACTION, BILATERAL  2016  . MOHS SURGERY Left 2018   malar cheek, BCC  . TONSILLECTOMY  1955    Prior to Admission medications   Medication Sig Start Date End Date Taking? Authorizing Provider  tamsulosin (FLOMAX) 0.4 MG CAPS capsule TAKE 1 CAPSULE DAILY 09/09/18  Yes Pleas Koch, NP  B Complex Vitamins (VITAMIN B COMPLEX PO) Take 1 tablet by mouth daily.    [provider]  Calcium Polycarbophil (EQ FIBER LAXATIVE PO) Take 1 capsule by mouth 2 (two) times daily.    [provider]  Cholecalciferol (VITAMIN D3 PO) Take 1 capsule by mouth daily.    [provider]  fluticasone (FLONASE) 50 MCG/ACT nasal spray Place 1 spray into both nostrils daily as needed for allergies.     [provider]  Multiple Vitamins-Minerals (SENIOR MULTIVITAMIN PLUS PO) Take 1 tablet by mouth daily.    [provider]  olopatadine (PATANOL) 0.1 % ophthalmic solution Place 1 drop into both eyes 2 (two) times daily as needed (as directed).    [provider]  Omega-3 Fatty Acids (FISH OIL PO) Take 1,300 mg by mouth daily.     [provider]    Allergies as of 10/10/2018 - Review Complete 10/01/2018  Allergen Reaction Noted  . Sudafed [pseudoephedrine hcl]  11/29/2015    Family History  Problem Relation Age of Onset  . Hyperlipidemia Mother   . Heart disease Mother   . Hypertension Mother   . Hyperlipidemia Father   . Heart disease Father   . Stroke Father   . Hypertension Father   . Heart attack Father   . Heart disease Brother   . Prostate cancer Brother   . Atrial fibrillation Brother   . Sleep apnea Brother     Social History   Socioeconomic History  . Marital status: Married    Spouse name: Dorian Pod  . Number of children: Not on file  . Years of education: Not on file  . Highest education level: Not on file  Occupational History  . Not on file  Social Needs  . Financial resource strain: Not on file  . Food insecurity    Worry: Not on file    Inability: Not on file  . Transportation needs    Medical: Not on file    Non-medical: Not on file  Tobacco Use  . Smoking status: Never Smoker  . Smokeless tobacco: Never Used  Substance and Sexual Activity  . Alcohol use: Yes    Comment: social  . Drug use: No  . Sexual activity: Not on file  Lifestyle  . Physical activity    Days per week: Not on file    Minutes per session: Not on file  . Stress: Not on file  Relationships  . Social Herbalist on phone: Not on file    Gets together: Not on file    Attends religious service: Not on file    Active member of club or organization: Not on file    Attends meetings of clubs or organizations: Not on file    Relationship status: Not on file  . Intimate partner violence    Fear of current or ex partner: Not on file    Emotionally abused: Not on file    Physically abused: Not on file    Forced sexual activity: Not on file  Other Topics Concern  . Not on file  Social History Narrative   Married.   3 children. 2 grandchildren.   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   Enjoys  biking, bird watching.    Review of Systems: See HPI, otherwise negative ROS  Physical Exam: BP 120/78   Pulse 65   Temp 98.1 F (36.7 C) (Tympanic)   Resp 16   Ht 5\' 10"  (1.778 m)   Wt 72.6 kg   SpO2 100%   BMI 22.96 kg/m  General:   Alert,  pleasant and cooperative in NAD Head:  Normocephalic and atraumatic. Neck:  Supple; no masses or thyromegaly. Lungs:  Clear throughout to auscultation.    Heart:  Regular rate and rhythm. Abdomen:  Soft, nontender and nondistended. Normal bowel sounds, without guarding, and without rebound.   Neurologic:  Alert and  oriented x4;  grossly normal neurologically.  Impression/Plan: Brian Glass is here for an colonoscopy to be performed for colon cancer screening  Risks, benefits, limitations, and alternatives regarding  colonoscopy have been reviewed with the patient.  Questions have been answered.  All parties agreeable.   Sherri Sear, MD  10/20/2018, 9:20 AM

## 2018-10-20 NOTE — Anesthesia Post-op Follow-up Note (Signed)
Anesthesia QCDR form completed.        

## 2018-10-20 NOTE — Transfer of Care (Signed)
Immediate Anesthesia Transfer of Care Note  Patient: Brian Glass  Procedure(s) Performed: COLONOSCOPY WITH PROPOFOL (N/A )  Patient Location: Endoscopy Unit  Anesthesia Type:General  Level of Consciousness: drowsy and patient cooperative  Airway & Oxygen Therapy: Patient Spontanous Breathing  Post-op Assessment: Report given to RN and Post -op Vital signs reviewed and stable  Post vital signs: Reviewed and stable  Last Vitals:  Vitals Value Taken Time  BP 113/70 10/20/18 1024  Temp    Pulse 66 10/20/18 1027  Resp 11 10/20/18 1027  SpO2 99 % 10/20/18 1027  Vitals shown include unvalidated device data.  Last Pain:  Vitals:   10/20/18 1024  TempSrc:   PainSc: 0-No pain         Complications: No apparent anesthesia complications

## 2018-10-20 NOTE — Anesthesia Postprocedure Evaluation (Signed)
Anesthesia Post Note  Patient: Brian Glass  Procedure(s) Performed: COLONOSCOPY WITH PROPOFOL (N/A )  Patient location during evaluation: Endoscopy Anesthesia Type: General Level of consciousness: awake and alert and oriented Pain management: pain level controlled Vital Signs Assessment: post-procedure vital signs reviewed and stable Respiratory status: spontaneous breathing Cardiovascular status: blood pressure returned to baseline Anesthetic complications: no     Last Vitals:  Vitals:   10/20/18 1034 10/20/18 1044  BP: 115/81 121/89  Pulse: 62 (!) 55  Resp: 16 16  Temp:    SpO2: 98% 99%    Last Pain:  Vitals:   10/20/18 1044  TempSrc:   PainSc: 0-No pain                 Shiraz Bastyr

## 2018-10-20 NOTE — Anesthesia Preprocedure Evaluation (Signed)
Anesthesia Evaluation  Patient identified by MRN, date of birth, ID band Patient awake    Reviewed: Allergy & Precautions, H&P , NPO status , Patient's Chart, lab work & pertinent test results  Airway Mallampati: I  TM Distance: >3 FB Neck ROM: Full    Dental no notable dental hx. (+) Teeth Intact, Dental Advisory Given   Pulmonary neg pulmonary ROS,    Pulmonary exam normal breath sounds clear to auscultation       Cardiovascular Exercise Tolerance: Good + dysrhythmias Atrial Fibrillation  Rhythm:Irregular Rate:Normal     Neuro/Psych negative neurological ROS  negative psych ROS   GI/Hepatic negative GI ROS, Neg liver ROS,   Endo/Other  negative endocrine ROS  Renal/GU negative Renal ROS  negative genitourinary   Musculoskeletal  (+) Arthritis , Osteoarthritis,    Abdominal   Peds  Hematology negative hematology ROS (+)   Anesthesia Other Findings Past Medical History: No date: Allergy No date: Arthritis No date: Atrial fibrillation ([place]) No date: Bilateral vitreous detachment No date: Chickenpox 2018: Hx of basal cell carcinoma     Comment:  L malar cheek No date: Typical atrial flutter ([facility])  Reproductive/Obstetrics negative OB ROS                             Anesthesia Physical  Anesthesia Plan  ASA: III  Anesthesia Plan: General   Post-op Pain Management:    Induction: Intravenous  PONV Risk Score and Plan: Propofol infusion  Airway Management Planned: Nasal Cannula  Additional Equipment:   Intra-op Plan:   Post-operative Plan:   Informed Consent: I have reviewed the patients History and Physical, chart, labs and discussed the procedure including the risks, benefits and alternatives for the proposed anesthesia with the patient or authorized representative who has indicated his/her understanding and acceptance.     Dental advisory given  Plan Discussed with:  CRNA  Anesthesia Plan Comments:         Anesthesia Quick Evaluation

## 2018-10-21 ENCOUNTER — Encounter: Payer: Self-pay | Admitting: Gastroenterology

## 2018-10-21 LAB — SURGICAL PATHOLOGY

## 2018-10-23 ENCOUNTER — Encounter: Payer: Self-pay | Admitting: Gastroenterology

## 2018-11-17 ENCOUNTER — Encounter: Payer: Self-pay | Admitting: Urology

## 2018-11-17 ENCOUNTER — Ambulatory Visit (INDEPENDENT_AMBULATORY_CARE_PROVIDER_SITE_OTHER): Payer: Medicare Other | Admitting: Urology

## 2018-11-17 ENCOUNTER — Other Ambulatory Visit: Payer: Self-pay

## 2018-11-17 VITALS — BP 114/76 | HR 67 | Ht 70.0 in | Wt 163.8 lb

## 2018-11-17 DIAGNOSIS — N401 Enlarged prostate with lower urinary tract symptoms: Secondary | ICD-10-CM | POA: Diagnosis not present

## 2018-11-17 DIAGNOSIS — R3912 Poor urinary stream: Secondary | ICD-10-CM | POA: Diagnosis not present

## 2018-11-17 DIAGNOSIS — R972 Elevated prostate specific antigen [PSA]: Secondary | ICD-10-CM | POA: Diagnosis not present

## 2018-11-17 LAB — MICROSCOPIC EXAMINATION
Bacteria, UA: NONE SEEN
Epithelial Cells (non renal): NONE SEEN /hpf (ref 0–10)
WBC, UA: NONE SEEN /hpf (ref 0–5)

## 2018-11-17 LAB — URINALYSIS, COMPLETE
Bilirubin, UA: NEGATIVE
Glucose, UA: NEGATIVE
Ketones, UA: NEGATIVE
Leukocytes,UA: NEGATIVE
Nitrite, UA: NEGATIVE
Protein,UA: NEGATIVE
Specific Gravity, UA: 1.015 (ref 1.005–1.030)
Urobilinogen, Ur: 0.2 mg/dL (ref 0.2–1.0)
pH, UA: 5.5 (ref 5.0–7.5)

## 2018-11-17 LAB — BLADDER SCAN AMB NON-IMAGING

## 2018-11-17 NOTE — Addendum Note (Signed)
Addended by: Donalee Citrin on: 11/17/2018 01:10 PM   Modules accepted: Orders

## 2018-11-17 NOTE — Patient Instructions (Signed)

## 2018-11-17 NOTE — Progress Notes (Signed)
11/17/18 11:47 AM   Brian Glass Oct 03, 1950 [id number]  Referring provider: Pleas Koch, NP [address] [place],  [place] [address]  CC: Elevated PSA  HPI: I saw Brian Glass in urology clinic in consultation for elevated PSA from Loma Boston, NP.  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  He was found to have an elevated PSA of 5.93 which was increased from his baseline of approximately 2.75.  He does have a history of elevated PSA of around 5 in 2013 when he was doing quite a bit of bike riding, and this dropped back down to normal range with antibiotics and anti-inflammatories.  He has never had a prostate biopsy before.  He has mild urinary symptoms of weak stream and nocturia 2-3 times per night that are well controlled on Flomax.  He has a history of cardiac ablation for atrial fibrillation.  He denies any prior abdominal surgeries.  He denies any erectile dysfunction.  There are no aggravating or alleviating factors.  Severity is moderate.  He has a family history of prostate cancer in his brother that was reportedly "aggressive ".  He was treated with a robotic prostatectomy in his 68s.   PMH: Past Medical History:  Diagnosis Date  . Allergy   . Arthritis   . Atrial fibrillation ([place])   . Bilateral vitreous detachment   . Chickenpox   . Hx of basal cell carcinoma 2018   L malar cheek  . Typical atrial flutter [facility])     Surgical History: Past Surgical History:  Procedure Laterality Date  . ATRIAL FIBRILLATION ABLATION N/A 05/10/2016   Procedure: Atrial Fibrillation Ablation;  Surgeon: [name], MD;  Location: [place] [facility];  Service: Cardiovascular;  Laterality: N/A;  . CATARACT EXTRACTION, BILATERAL  2016  . COLONOSCOPY WITH PROPOFOL N/A 10/20/2018   Procedure: COLONOSCOPY WITH PROPOFOL;  Surgeon: Lin Landsman, MD;  [facility] ENDOSCOPY;  Service:  Gastroenterology;  Laterality: N/A;  . MOHS SURGERY Left 2018   malar cheek, BCC  . TONSILLECTOMY  1955    Allergies:  Allergies  Allergen Reactions  . Sudafed [Pseudoephedrine Hcl]     Hard to urinate     Family History: Family History  Problem Relation Age of Onset  . Hyperlipidemia Mother   . Heart disease Mother   . Hypertension Mother   . Hyperlipidemia Father   . Heart disease Father   . Stroke Father   . Hypertension Father   . Heart attack Father   . Heart disease Brother   . Prostate cancer Brother   . Atrial fibrillation Brother   . Sleep apnea Brother     Social History:  reports that he has never smoked. He has never used smokeless tobacco. He reports current alcohol use. He reports that he does not use drugs.  ROS: Please see flowsheet from today's date for complete review of systems.  Physical Exam: BP 114/76 (BP Location: Left Arm, Patient Position: Sitting, Cuff Size: Normal)   Pulse 67   Ht 5\' 10"  (1.778 m)   Wt 163 lb 12.8 oz (74.3 kg)   BMI 23.50 kg/m    Constitutional:  Alert and oriented, No acute distress. Cardiovascular: No clubbing, cyanosis, or edema. Respiratory: Normal respiratory effort, no increased work of breathing. GI: Abdomen is soft, nontender, nondistended, no abdominal masses DRE: 50 g, smooth, no nodules or masses Lymph: No  cervical or inguinal lymphadenopathy. Skin: No rashes, bruises or suspicious lesions. Neurologic: Grossly intact, no focal deficits, moving all 4 extremities. Psychiatric: Normal mood and affect.  Laboratory Data: PSA history reviewed, see HPI  Pertinent Imaging: No cross-sectional imaging to review  Assessment & Plan:   In summary, the patient is a 68 year old healthy male with a family history of "aggressive "prostate cancer in his brother who presents with an elevated PSA of 5.93 and benign DRE.  We reviewed the implications of an elevated PSA and the uncertainty surrounding it. In general, a  man's PSA increases with age and is produced by both normal and cancerous prostate tissue. The differential diagnosis for elevated PSA includes BPH, prostate cancer, infection, recent intercourse/ejaculation, recent urethroscopic manipulation (foley placement/cystoscopy) or trauma, and prostatitis.   Management of an elevated PSA can include observation or prostate biopsy and we discussed this in detail. Our goal is to detect clinically significant prostate cancers, and manage with either active surveillance, surgery, or radiation for localized disease. Risks of prostate biopsy include bleeding, infection (including life threatening sepsis), pain, and lower urinary symptoms. Hematuria, hematospermia, and blood in the stool are all common after biopsy and can persist up to 4 weeks.   Repeat PSA with free/total ratio today, call with results.  Pursue prostate biopsy if PSA remains elevated  Billey Co, MD  [facility] [address] [place], [place] [address] [phone]

## 2018-11-18 ENCOUNTER — Telehealth: Payer: Self-pay | Admitting: Family Medicine

## 2018-11-18 ENCOUNTER — Other Ambulatory Visit: Payer: Self-pay | Admitting: Family Medicine

## 2018-11-18 DIAGNOSIS — R972 Elevated prostate specific antigen [PSA]: Secondary | ICD-10-CM

## 2018-11-18 LAB — FPSA% REFLEX
% FREE PSA: 18.5 %
PSA, FREE: 0.87 ng/mL

## 2018-11-18 LAB — PSA TOTAL (REFLEX TO FREE): Prostate Specific Ag, Serum: 4.7 ng/mL — ABNORMAL HIGH (ref 0.0–4.0)

## 2018-11-18 NOTE — Telephone Encounter (Signed)
Patient notified and wants to repeat PSA in 1 month. Appointment set and Order in.

## 2018-11-18 NOTE — Telephone Encounter (Signed)
-----   Message from Billey Co, MD sent at 11/18/2018  8:27 AM EDT ----- PSA came down to 4.7 from 5.9, but still slightly elevated. We can either proceed with prostate biopsy, or repeat PSA in one month to see if it comes all the way back down to normal range. Either is very reasonable. Please set up a virtual phone visit in the next week if he wants to discuss further  Thanks [name], MD 11/18/2018

## 2018-12-23 ENCOUNTER — Other Ambulatory Visit: Payer: Medicare Other

## 2018-12-23 ENCOUNTER — Other Ambulatory Visit: Payer: Self-pay

## 2018-12-23 DIAGNOSIS — R972 Elevated prostate specific antigen [PSA]: Secondary | ICD-10-CM

## 2018-12-24 LAB — PSA: Prostate Specific Ag, Serum: 4.9 ng/mL — ABNORMAL HIGH (ref 0.0–4.0)

## 2018-12-25 ENCOUNTER — Telehealth: Payer: Self-pay

## 2018-12-25 DIAGNOSIS — Z23 Encounter for immunization: Secondary | ICD-10-CM | POA: Diagnosis not present

## 2018-12-25 NOTE — Telephone Encounter (Signed)
App made and mailed °

## 2018-12-25 NOTE — Telephone Encounter (Signed)
Patient notified and biopsy instructions were reviewed in detail and sent via mychart as well

## 2018-12-25 NOTE — Telephone Encounter (Signed)
-----   Message from Billey Co, MD sent at 12/25/2018 12:41 PM EDT ----- PSA remains elevated at 4.9. Would recommend prostate biopsy, especially with his family history of prostate cancer.   Please review instructions and schedule prostate biopsy  Thanks [name], MD 12/25/2018

## 2019-01-26 ENCOUNTER — Encounter: Payer: Self-pay | Admitting: Urology

## 2019-01-26 ENCOUNTER — Other Ambulatory Visit: Payer: Self-pay

## 2019-01-26 ENCOUNTER — Ambulatory Visit (INDEPENDENT_AMBULATORY_CARE_PROVIDER_SITE_OTHER): Payer: Medicare Other | Admitting: Urology

## 2019-01-26 ENCOUNTER — Other Ambulatory Visit: Payer: Self-pay | Admitting: Urology

## 2019-01-26 VITALS — BP 109/70 | HR 66 | Ht 70.0 in | Wt 162.0 lb

## 2019-01-26 DIAGNOSIS — R3912 Poor urinary stream: Secondary | ICD-10-CM | POA: Diagnosis not present

## 2019-01-26 DIAGNOSIS — N401 Enlarged prostate with lower urinary tract symptoms: Secondary | ICD-10-CM | POA: Diagnosis not present

## 2019-01-26 DIAGNOSIS — N4231 Prostatic intraepithelial neoplasia: Secondary | ICD-10-CM | POA: Diagnosis not present

## 2019-01-26 DIAGNOSIS — R972 Elevated prostate specific antigen [PSA]: Secondary | ICD-10-CM | POA: Diagnosis not present

## 2019-01-26 DIAGNOSIS — C61 Malignant neoplasm of prostate: Secondary | ICD-10-CM | POA: Diagnosis not present

## 2019-01-26 MED ORDER — GENTAMICIN SULFATE 40 MG/ML IJ SOLN
40.0000 mg | Freq: Once | INTRAMUSCULAR | Status: AC
  Administered 2019-01-26: 40 mg via INTRAMUSCULAR

## 2019-01-26 MED ORDER — LEVOFLOXACIN 500 MG PO TABS
500.0000 mg | ORAL_TABLET | Freq: Once | ORAL | Status: AC
  Administered 2019-01-26: 500 mg via ORAL

## 2019-01-26 NOTE — Patient Instructions (Signed)
Transrectal Ultrasound-Guided Prostate Biopsy, Care After °This sheet gives you information about how to care for yourself after your procedure. Your doctor may also give you more specific instructions. If you have problems or questions, contact your doctor. °What can I expect after the procedure? °After the procedure, it is common to have: °· Pain and discomfort in your butt, especially while sitting. °· Pink-colored pee (urine), due to small amounts of blood in the pee. °· Burning while peeing (urinating). °· Blood in your poop (stool). °· Bleeding from your butt. °· Blood in your semen. °Follow these instructions at home: °Medicines °· Take over-the-counter and prescription medicines only as told by your doctor. °· If you were prescribed antibiotic medicine, take it as told by your doctor. Do not stop taking the antibiotic even if you start to feel better. °Activity ° °· Do not drive for 24 hours if you were given a medicine to help you relax (sedative) during your procedure. °· Return to your normal activities as told by your doctor. Ask your doctor what activities are safe for you. °· Ask your doctor when it is okay for you to have sex. °· Do not lift anything that is heavier than 10 lb (4.5 kg), or the limit that you are told, until your doctor says that it is safe. °General instructions ° °· Drink enough water to keep your pee pale yellow. °· Watch your pee, poop, and semen for new bleeding or bleeding that gets worse. °· Keep all follow-up visits as told by your doctor. This is important. °Contact a doctor if you: °· Have blood clots in your pee or poop. °· Notice that your pee smells bad or unusual. °· Have very bad belly pain. °· Have trouble peeing. °· Notice that your lower belly feels firm. °· Have blood in your pee for more than 2 weeks after the procedure. °· Have blood in your semen for more than 2 months after the procedure. °· Have problems getting an erection. °· Feel sick to your stomach  (nauseous). °· Throw up (vomit). °· Have new or worse bleeding in your pee, poop, or semen. °Get help right away if you: °· Have a fever or chills. °· Have bright red pee. °· Have very bad pain that does not get better with medicine. °· Cannot pee. °Summary °· After this procedure, it is common to have pain and discomfort around your butt, especially while sitting. °· You may have blood in your pee and poop. °· It is common to have blood in your semen for 1-2 months. °· If you were prescribed antibiotic medicine, take it as told by your doctor. Do not stop taking the antibiotic even if you start to feel better. °· Get help right away if you have a fever or chills. °This information is not intended to replace advice given to you by your health care provider. Make sure you discuss any questions you have with your health care provider. °Document Released: 01/01/2017 Document Revised: 06/25/2018 Document Reviewed: 01/01/2017 °Elsevier Patient Education © 2020 [employer] ° °

## 2019-01-26 NOTE — Progress Notes (Signed)
   01/26/19  Indication: Elevated PSA  Prostate Biopsy Procedure   Informed consent was obtained, and we discussed the risks of bleeding and infection/sepsis. A time out was performed to ensure correct patient identity.  Pre-Procedure: - Last PSA value: 4.9 - Gentamicin and levaquin given for antibiotic prophylaxis - Transrectal Ultrasound performed revealing a 38 gm prostate, PSA density 0.13 - No significant hypoechoic or median lobe noted  Procedure: - Prostate block performed using 10 cc 1% lidocaine and biopsies taken from sextant areas, a total of 12 under ultrasound guidance.  Post-Procedure: - Patient tolerated the procedure well - He was counseled to seek immediate medical attention if experiences significant bleeding, fevers, or severe pain - Return in one week to discuss biopsy results  Assessment/ Plan: Will follow up in 1-2 weeks to discuss pathology  [name], MD 01/26/2019

## 2019-02-03 LAB — ANATOMIC PATHOLOGY REPORT: PDF Image: 0

## 2019-02-09 ENCOUNTER — Ambulatory Visit: Payer: Medicare Other | Admitting: Urology

## 2019-02-17 ENCOUNTER — Ambulatory Visit (INDEPENDENT_AMBULATORY_CARE_PROVIDER_SITE_OTHER): Payer: Medicare Other | Admitting: Urology

## 2019-02-17 ENCOUNTER — Encounter: Payer: Self-pay | Admitting: Urology

## 2019-02-17 ENCOUNTER — Other Ambulatory Visit: Payer: Self-pay

## 2019-02-17 VITALS — BP 114/72 | HR 68 | Ht 70.0 in | Wt 163.0 lb

## 2019-02-17 DIAGNOSIS — C61 Malignant neoplasm of prostate: Secondary | ICD-10-CM | POA: Diagnosis not present

## 2019-02-17 NOTE — Patient Instructions (Signed)
Prostate Cancer  The prostate is a walnut-sized gland that is involved in the production of semen. It is located below a man's bladder, in front of the rectum. Prostate cancer is the abnormal growth of cells in the prostate gland. What are the causes? The exact cause of this condition is not known. What increases the risk? This condition is more likely to develop in men who:  Are older than age 68.  Are African-American.  Are obese.  Have a family history of prostate cancer.  Have a family history of breast cancer. What are the signs or symptoms? Symptoms of this condition include:  A need to urinate often.  Weak or interrupted flow of urine.  Trouble starting or stopping urination.  Inability to urinate.  Pain or burning during urination.  Painful ejaculation.  Blood in urine or semen.  Persistent pain or discomfort in the lower back, lower abdomen, hips, or upper thighs.  Trouble getting an erection.  Trouble emptying the bladder all the way. How is this diagnosed? This condition can be diagnosed with:  A digital rectal exam. For this exam, a health care provider inserts a gloved finger into the rectum to feel the prostate gland.  A blood test called a prostate-specific antigen (PSA) test.  An imaging test called transrectal ultrasonography.  A procedure in which a sample of tissue is taken from the prostate and examined under a microscope (prostate biopsy). Once the condition is diagnosed, tests will be done to determine how far the cancer has spread. This is called staging the cancer. Staging may involve imaging tests, such as:  A bone scan.  A CT scan.  A PET scan.  An MRI. The stages of prostate cancer are as follows:  Stage I. At this stage, the cancer is found in the prostate only. The cancer is not visible on imaging tests and it is usually found by accident, such as during a prostate surgery.  Stage II. At this stage, the cancer is more advanced  than it is in stage I, but the cancer has not spread outside the prostate.  Stage III. At this stage, the cancer has spread beyond the outer layer of the prostate to nearby tissues. The cancer may be found in the seminal vesicles, which are near the bladder and the prostate.  Stage IV. At this stage, the cancer has spread other parts of the body, such as the lymph nodes, bones, bladder, rectum, liver, or lungs. How is this treated? Treatment for this condition depends on several factors, including the stage of the cancer, your age, personal preferences, and your overall health. Talk with your health care provider about treatment options that are recommended for you. Common treatments include:  Observation for early stage prostate cancer (active surveillance). This involves having exams, blood tests, and in some cases, more biopsies. For some men, this is the only treatment needed.  Surgery. Types of surgeries include: ? Open surgery. In this surgery, a larger incision is made to remove the prostate. ? A laparoscopic prostatectomy. This is a surgery to remove the prostate and lymph nodes through several, small incisions. It is often referred to as a minimally invasive surgery. ? A robotic prostatectomy. This is a surgery to remove the prostate and lymph nodes with the help of a robotic arm that is controlled by a computer. ? Orchiectomy. This is a surgery to remove the testicles. ? Cryosurgery. This is a surgery to freeze and destroy cancer cells.  Radiation treatment. Types   of radiation treatment include: ? External beam radiation. This type aims beams of radiation from outside the body at the prostate to destroy cancerous cells. ? Brachytherapy. This type uses radioactive needles, seeds, wires, or tubes that are implanted into the prostate gland. Like external beam radiation, brachytherapy destroys cancerous cells. An advantage is that this type of radiation limits the damage to surrounding  tissue and has fewer side effects.  High-intensity, focused ultrasonography. This treatment destroys cancer cells by delivering high-energy ultrasound waves to the cancerous cells.  Chemotherapy medicines. This treatment kills cancer cells or stops them from multiplying.  Hormone treatment. This treatment involves taking medicines that act on one of the male hormones (testosterone): ? By stopping your body from producing testosterone. ? By blocking testosterone from reaching cancer cells. Follow these instructions at home:  Take over-the-counter and prescription medicines only as told by your health care provider.  Maintain a healthy diet.  Get plenty of sleep.  Consider joining a support group for men who have prostate cancer. Meeting with a support group may help you learn to cope with the stress of having cancer.  Keep all follow-up visits as told by your health care provider. This is important.  If you have to go to the hospital, notify your cancer specialist (oncologist).  Treatment for prostate cancer may affect sexual function. Continue to have intimate moments with your partner. This may include touching, holding, hugging, and caressing. Contact a health care provider if:  You have trouble urinating.  You have blood in your urine.  You have pain in your hips, back, or chest. Get help right away if:  You have weakness or numbness in your legs.  You cannot control urination or your bowel movements (incontinence).  You have trouble breathing.  You have sudden chest pain.  You have chills or a fever. Summary  The prostate is a walnut-sized gland that is involved in the production of semen. It is located below a man's bladder, in front of the rectum. Prostate cancer is the abnormal growth of cells in the prostate gland.  Treatment for this condition depends on several factors, including the stage of the cancer, your age, personal preferences, and your overall health.  Talk with your health care provider about treatment options that are recommended for you.  Consider joining a support group for men who have prostate cancer. Meeting with a support group may help you learn to cope with the stress of having cancer. This information is not intended to replace advice given to you by your health care provider. Make sure you discuss any questions you have with your health care provider. Document Released: 03/05/2005 Document Revised: 02/15/2017 Document Reviewed: 11/14/2015 Elsevier Patient Education  2020 [employer]  

## 2019-02-17 NOTE — Progress Notes (Signed)
02/17/2019 11:46 AM   [name] 01-30-1951 [id number]  Reason for visit: Discuss prostate biopsy results  HPI: I saw [name] back in urology clinic to discuss his prostate biopsy results.  He has a family history of "aggressive" prostate cancer in his brother that was treated with radical prostatectomy  Prostate biopsy was performed on 01/26/2019 for an elevated PSA of 5.9, and showed a 38 g prostate with a PSA density of 0.15, no significant hypoechoic or median lobe, and 4/12 cores positive for primarily 3+3 =6 prostate cancer, with 1 core of 3+4 = 7 disease with max core involvement of 20%.  I entered his data into the [facility] prostate nomogram which shows a 69% risk of organ confined disease, 29% risk of extracapsular extension, 2% risk of lymph node involvement, and 2% risk of SV invasion.  He denies any erectile dysfunction at baseline.  He does have some bothersome urinary symptoms of weak stream and difficulty emptying, and has been on Flomax for over 10 years.  We had a lengthy conversation today about the patient's new diagnosis of prostate cancer.  We reviewed the risk classifications per the AUA guidelines including very low risk, low risk, intermediate risk, and high risk disease, and the need for additional staging imaging with CT and bone scan in patients with unfavorable intermediate risk and high risk disease.  I explained that his life expectancy, clinical stage, Gleason score, PSA, and other co-morbidities influence treatment strategies.  We discussed the roles of active surveillance, radiation therapy, surgical therapy with robotic prostatectomy, and hormone therapy with androgen deprivation.  We discussed that patients urinary symptoms also impact treatment strategy, as patients with severe lower urinary tract symptoms may have significant worsening or even develop urinary retention after undergoing radiation.  In regards to surgery, we discussed robotic prostatectomy  +/- lymphadenectomy at length.  The procedure takes 3 to 4 hours, and patient's typically discharge home on post-op day #1.  A Foley catheter is left in place for 7 to 10 days to allow for healing of the vesicourethral anastomosis.  There is a small risk of bleeding, infection, damage to surrounding structures or bowel, hernia, DVT/PE, or serious cardiac or pulmonary complications.  We discussed at length post-op side effects including erectile dysfunction, and the importance of pre-operative erectile function on long-term outcomes.  Even with a nerve sparing approach, there is an approximately 25% rate of permanent erectile dysfunction.  We also discussed postop urinary incontinence at length.  We expect patients to have stress incontinence post-operatively that will improve over period of weeks to months.  Less than 10% of men will require a pad at 1 year after surgery.  Patients will need to avoid heavy lifting and strenuous activity for 3 to 4 weeks, but most men return to their baseline activity status by 6 weeks.  In summary, Brian Glass is a 68 y.o. 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 He would like to consider his options and do some research, but is leaning toward surgery.  I think with his excellent health, history of aggressive prostate cancer in his brother, and bothersome urinary symptoms at baseline he would be a great candidate for robotic prostatectomy with bilateral pelvic lymph node dissection.  He will call to let us know how he would like to proceed.  I have also placed a consult to radiation oncology for discussion of radiation options.  A total of 25 minutes were spent face-to-face with the patient, greater than  50% was spent in patient education, counseling, and coordination of care regarding new diagnosis of favorable intermediate risk prostate cancer and treatment options.  Billey Co, [facility] [address] [place], [place] [address] [phone]

## 2019-02-19 DIAGNOSIS — L814 Other melanin hyperpigmentation: Secondary | ICD-10-CM | POA: Diagnosis not present

## 2019-02-19 DIAGNOSIS — L821 Other seborrheic keratosis: Secondary | ICD-10-CM | POA: Diagnosis not present

## 2019-02-19 DIAGNOSIS — D225 Melanocytic nevi of trunk: Secondary | ICD-10-CM | POA: Diagnosis not present

## 2019-02-19 DIAGNOSIS — D1801 Hemangioma of skin and subcutaneous tissue: Secondary | ICD-10-CM | POA: Diagnosis not present

## 2019-02-19 DIAGNOSIS — L570 Actinic keratosis: Secondary | ICD-10-CM | POA: Diagnosis not present

## 2019-02-19 DIAGNOSIS — D2372 Other benign neoplasm of skin of left lower limb, including hip: Secondary | ICD-10-CM | POA: Diagnosis not present

## 2019-03-05 ENCOUNTER — Institutional Professional Consult (permissible substitution): Payer: Medicare Other | Admitting: Radiation Oncology

## 2019-03-23 ENCOUNTER — Other Ambulatory Visit: Payer: Self-pay | Admitting: Radiology

## 2019-03-23 DIAGNOSIS — C61 Malignant neoplasm of prostate: Secondary | ICD-10-CM

## 2019-03-24 ENCOUNTER — Telehealth: Payer: Medicare Other | Admitting: Urology

## 2019-03-24 ENCOUNTER — Other Ambulatory Visit: Payer: Self-pay

## 2019-03-24 ENCOUNTER — Other Ambulatory Visit: Payer: Self-pay | Admitting: Radiology

## 2019-03-24 DIAGNOSIS — C61 Malignant neoplasm of prostate: Secondary | ICD-10-CM

## 2019-03-27 ENCOUNTER — Other Ambulatory Visit: Payer: Self-pay | Admitting: *Deleted

## 2019-03-27 DIAGNOSIS — C61 Malignant neoplasm of prostate: Secondary | ICD-10-CM

## 2019-03-30 ENCOUNTER — Ambulatory Visit: Payer: Medicare Other | Attending: Urology | Admitting: Physical Therapy

## 2019-03-30 ENCOUNTER — Other Ambulatory Visit: Payer: Medicare Other

## 2019-03-30 ENCOUNTER — Encounter: Payer: Self-pay | Admitting: Physical Therapy

## 2019-03-30 ENCOUNTER — Other Ambulatory Visit: Payer: Self-pay

## 2019-03-30 DIAGNOSIS — Z01810 Encounter for preprocedural cardiovascular examination: Secondary | ICD-10-CM | POA: Diagnosis not present

## 2019-03-30 DIAGNOSIS — C61 Malignant neoplasm of prostate: Secondary | ICD-10-CM | POA: Diagnosis not present

## 2019-03-30 DIAGNOSIS — M6208 Separation of muscle (nontraumatic), other site: Secondary | ICD-10-CM | POA: Diagnosis not present

## 2019-03-30 DIAGNOSIS — R278 Other lack of coordination: Secondary | ICD-10-CM

## 2019-03-30 LAB — URINALYSIS, COMPLETE
Bilirubin, UA: NEGATIVE
Glucose, UA: NEGATIVE
Ketones, UA: NEGATIVE
Leukocytes,UA: NEGATIVE
Nitrite, UA: NEGATIVE
Protein,UA: NEGATIVE
Specific Gravity, UA: 1.025 (ref 1.005–1.030)
Urobilinogen, Ur: 0.2 mg/dL (ref 0.2–1.0)
pH, UA: 6.5 (ref 5.0–7.5)

## 2019-03-30 LAB — MICROSCOPIC EXAMINATION: Bacteria, UA: NONE SEEN

## 2019-03-30 NOTE — Patient Instructions (Addendum)
Balance out bladder irritants and water   Starting with  6 1/2 cups of bladder irritants : 4 cups of water  This week:  decrease beer,   4 cups of coffee, 1/2 OJ,    Increase water from 4 cups to 8 cups ( ensure you use a 24 fl oz tumbler to refill to ensure systematic increase of water)   _____________   Deep core level 1 and 2 *( handout)    _____________ PELVIC FLOOR / KEGEL EXERCISES   Pelvic floor/ Kegel exercises are used to strengthen the muscles in the base of your pelvis that are responsible for supporting your pelvic organs and preventing urine/feces leakage. Based on your therapist's recommendations, they can be performed while standing, sitting, or lying down.  Make yourself aware of this muscle group by using these cues:  Imagine you are in a crowded room and you feel the need to pass gas. Your response is to pull up and in at the rectum.  Close the rectum. Pull the muscles up inside your body,feeling your scrotum lifting as well . Feel the pelvic floor muscles lift as if you were walking into a cold lake.  Place your hand on top of your pubic bone. Tighten and draw in the muscles around the anal muscles without squeezing the buttock muscles.  Common Errors:  Breath holding: If you are holding your breath, you may be bearing down against your bladder instead of pulling it up. If you belly bulges up while you are squeezing, you are holding your breath. Be sure to breathe gently in and out while exercising. Counting out loud may help you avoid holding your breath.  Accessory muscle use: You should not see or feel other muscle movement when performing pelvic floor exercises. When done properly, no one can tell that you are performing the exercises. Keep the buttocks, belly and inner thighs relaxed.  Overdoing it: Your muscles can fatigue and stop working for you if you over-exercise. You may actually leak more or feel soreness at the lower abdomen or  rectum.    SHORT HOLDS: Position: on back,  BUT DO NOT DO WHEN CATHETER IS IN !    Inhale and then exhale. Then squeeze the muscle.  (Be sure to let belly sink in with exhales and not push outward)  Perform 10 repetitions, 3  Times/day ( after breakfast, lunch, dinner)   **ALSO SQUEEZE BEFORE YOUR SNEEZE, COUGH, LAUGH to decrease downward pressure   **ALSO EXHALE BEFORE YOU RISE AGAINST GRAVITY (lifting, sit to stand, from squat to stand)   ___    Avoid straining pelvic floor, abdominal muscles , spine  Use log rolling technique instead of getting out of bed with your neck or the sit-up     Log rolling into and out of bed   Log rolling into and out of bed If getting out of bed on R side, Bent knees, scoot hips/ shoulder to L  Raise R arm completely overhead, rolling onto armpit  Then lower bent knees to bed to get into complete side lying position  Then drop legs off bed, and push up onto R elbow/forearm, and use L hand to push onto the bed   ___   Proper body mechanics with getting out of a chair to decrease strain  on back &pelvic floor   Avoid holding your breath when Getting out of the chair:  Scoot to front part of chair chair Heels behind feet, feet are hip width apart,  nose over toes  Inhale like you are smelling roses Exhale to stand

## 2019-03-31 ENCOUNTER — Encounter
Admission: RE | Admit: 2019-03-31 | Discharge: 2019-03-31 | Disposition: A | Discharge status: Home / Self Care | Payer: Medicare Other | Source: Ambulatory Visit | Attending: Urology | Admitting: Urology

## 2019-03-31 ENCOUNTER — Other Ambulatory Visit: Payer: Self-pay

## 2019-03-31 DIAGNOSIS — Z01818 Encounter for other preprocedural examination: Secondary | ICD-10-CM | POA: Diagnosis not present

## 2019-03-31 DIAGNOSIS — R9431 Abnormal electrocardiogram [ECG] [EKG]: Secondary | ICD-10-CM | POA: Diagnosis not present

## 2019-03-31 DIAGNOSIS — I4510 Unspecified right bundle-branch block: Secondary | ICD-10-CM | POA: Insufficient documentation

## 2019-03-31 DIAGNOSIS — I4891 Unspecified atrial fibrillation: Secondary | ICD-10-CM | POA: Insufficient documentation

## 2019-03-31 DIAGNOSIS — R54 Age-related physical debility: Secondary | ICD-10-CM | POA: Diagnosis not present

## 2019-03-31 DIAGNOSIS — I480 Paroxysmal atrial fibrillation: Secondary | ICD-10-CM | POA: Diagnosis not present

## 2019-03-31 LAB — TYPE AND SCREEN
ABO/RH(D): O POS
Antibody Screen: NEGATIVE

## 2019-03-31 LAB — BASIC METABOLIC PANEL
Anion gap: 5 (ref 5–15)
BUN: 20 mg/dL (ref 8–23)
CO2: 30 mmol/L (ref 22–32)
Calcium: 9.1 mg/dL (ref 8.9–10.3)
Chloride: 105 mmol/L (ref 98–111)
Creatinine, Ser: 1.06 mg/dL (ref 0.61–1.24)
GFR calc Af Amer: >60 mL/min (ref >60)
GFR calc non Af Amer: >60 mL/min (ref >60)
Glucose, Bld: 105 mg/dL — ABNORMAL HIGH (ref 70–99)
Potassium: 4.4 mmol/L (ref 3.5–5.1)
Sodium: 140 mmol/L (ref 135–145)

## 2019-03-31 LAB — CBC
HCT: 41.1 % (ref 39.0–52.0)
Hemoglobin: 14.3 g/dL (ref 13.0–17.0)
MCH: 30.4 pg (ref 26.0–34.0)
MCHC: 34.8 g/dL (ref 30.0–36.0)
MCV: 87.4 fL (ref 80.0–100.0)
Platelets: 152 10*3/uL (ref 150–400)
RBC: 4.7 MIL/uL (ref 4.22–5.81)
RDW: 12 % (ref 11.5–15.5)
WBC: 4.4 10*3/uL (ref 4.0–10.5)
nRBC: 0 % (ref 0.0–0.2)

## 2019-03-31 NOTE — Patient Instructions (Addendum)
Your procedure is scheduled on:  Monday, January 18 Report to Day Surgery on the 2nd floor of the [facility]. To find out your arrival time, please call [phone] between 1PM - 3PM on: Friday, January 15  REMEMBER: Instructions that are not followed completely may result in serious medical risk, up to and including death; or upon the discretion of your surgeon and anesthesiologist your surgery may need to be rescheduled.  Do not eat food after midnight the night before surgery.  No gum chewing, lozengers or hard candies.  You may however, drink CLEAR liquids up to 2 hours before you are scheduled to arrive for your surgery. Do not drink anything within 2 hours of the start of your surgery.  Clear liquids include: - water  - apple juice without pulp - gatorade - black coffee or tea (Do NOT add milk or creamers to the coffee or tea) Do NOT drink anything that is not on this list.  No Alcohol for 24 hours before or after surgery.  No Smoking including e-cigarettes for 24 hours prior to surgery.  No chewable tobacco products for at least 6 hours prior to surgery.  No nicotine patches on the day of surgery.  On the morning of surgery brush your teeth with toothpaste and water, you may rinse your mouth with mouthwash if you wish. Do not swallow any toothpaste or mouthwash.  Notify your doctor if there is any change in your medical condition (cold, fever, infection).  Do not wear jewelry, make-up, hairpins, clips or nail polish.  Do not wear lotions, powders, or perfumes.   Do not shave 48 hours prior to surgery.   Contacts and dentures may not be worn into surgery.  Do not bring valuables to the hospital, including drivers license, insurance or credit cards.  [facility] is not responsible for any belongings or valuables.   TAKE THESE MEDICATIONS THE MORNING OF SURGERY: NONE  Use CHG Soap as directed on instruction sheet.  Stop Anti-inflammatories (NSAIDS) such as Advil,  Aleve, Ibuprofen, Motrin, Naproxen, Naprosyn and Aspirin based products such as Excedrin, Goodys Powder, BC Powder. (May take Tylenol or Acetaminophen if needed.)  Stop ANY OVER THE COUNTER supplements until after surgery. (fish oil) (May continue Vitamin D, Vitamin B, and multivitamin.)  Wear comfortable clothing (specific to your surgery type) to the hospital.  If you are being discharged the day of surgery, you will not be allowed to drive home. You will need a responsible adult to drive you home and stay with you that night.   If you are taking public transportation, you will need to have a responsible adult with you. Please confirm with your physician that it is acceptable to use public transportation.   Please call [phone] if you have any questions about these instructions.

## 2019-03-31 NOTE — Therapy (Signed)
[place] MAIN [facility] SERVICES [address] [place], [place], [address] Phone: [phone]   Fax:  [phone]  Physical Therapy Evaluation  Patient Details  Name: Brian Glass MRN: [id number] Date of Birth: Jul 31, 1950 Referring Provider (PT): Sninisky    Encounter Date: 03/30/2019  PT End of Session - 03/30/19 1344    Visit Number  1    Number of Visits  1    Date for PT Re-Evaluation  03/31/19    PT Start Time  1302    PT Stop Time  1400    PT Time Calculation (min)  58 min    Activity Tolerance  Patient tolerated treatment well    Behavior During Therapy  [facility] for tasks assessed/performed       Past Medical History:  Diagnosis Date  . Allergy   . Arthritis   . Atrial fibrillation ([place])   . Bilateral vitreous detachment   . Chickenpox   . Hx of basal cell carcinoma 2018   L malar cheek  . Prostate cancer ([place]) 2020  . Typical atrial flutter [facility])     Past Surgical History:  Procedure Laterality Date  . ATRIAL FIBRILLATION ABLATION N/A 05/10/2016   Procedure: Atrial Fibrillation Ablation;  Surgeon: [name], MD;  Location: [place] CV LAB;  Service: Cardiovascular;  Laterality: N/A;  . CATARACT EXTRACTION, BILATERAL  2016  . COLONOSCOPY WITH PROPOFOL N/A 10/20/2018   Procedure: COLONOSCOPY WITH PROPOFOL;  Surgeon: Lin Landsman, MD;  Location: [facility] ENDOSCOPY;  Service: Gastroenterology;  Laterality: N/A;  . EYE SURGERY    . MOHS SURGERY Left 2018   malar cheek, BCC  . TONSILLECTOMY  1955    There were no vitals filed for this visit.   Subjective Assessment - 03/30/19 1308    Subjective  Pt has prostate cancer scheduled on 04/06/19. Pt sometimes has a hard time going to start his urine stream and sometimes the stream is slow. Does not strain for urine nor bowel movements. Denied LBP, hernias. Past injuries: Broken L foot and sprained R ankle. In the last 6 months, sometimes, when he walks, he has pain in the outer  edge of his R foot.  Daily fluid intake: 4-6 cups of water, 4 cups of coffee, 1/2 OJ, beer every other day. Physical activity: 15K steps of walking, spinning 3 x day. Little flexibility program.  Trying to get back to push ups again after stopping doing 100 reps with increased pain in the R shoulder.         [facility] PT Assessment - 03/30/19 1337      Assessment   Medical Diagnosis  prostate cancer    Referring Provider (PT)  Sninisky     Onset Date/Surgical Date  04/06/19      Precautions   Precautions  None      Restrictions   Weight Bearing Restrictions  No      Balance Screen   Has the patient fallen in the past 6 months  No      [place] residence    Type of [place] to enter   2   Home Layout  One level      Prior Function   Level of Independence  Independent      Observation/Other Assessments   Observations  ankles crossed       Coordination   Gross Motor Movements are Fluid  and Coordinated  --   chest breathing     Palpation   SI assessment   L iliac crest slighly higher, L shoulder slightly lower                 Objective measurements completed on examination: See above findings.    Pelvic Floor Special Questions - 03/30/19 1331    Diastasis Recti  3 fingers width along linea alba ( post Tx: no fingers width above umbilicus, 1 fingers width below)       External Perineal Exam  through clothing, aboominal overuse with cue for pelvic floor contractin    External Palpation  R pelvic floor decreased activation         [facility] Adult PT Treatment/Exercise - 03/30/19 1355      Neuro Re-ed    Neuro Re-ed Details   cued for deepc ore level 1 and 2 , body mechanics, quick pelvic floor contractions       Manual Therapy   Manual therapy comments  quadriped: fascial pulling with shoulder flexion/ trunk rotation                PT Short Term Goals - 03/30/19 1347      PT SHORT TERM  GOAL #1   Title  pt will demo correct technique with deep core levle 1 and 2 in order to increase intraabdominal pressure for optimal lengtehning of pelvic floor to minimize delayed urination and minimizing incontinence post surgery    Time  1    Period  Weeks    Status  Achieved      PT SHORT TERM GOAL #2   Title  Pt will demo decreased 3 to < 1  fingers width along linea alba in order to strengtehn abdominal pelvic tensigrity for better outcomes with kegel strengthening and increase postural stability.    Time  1    Period  Days    Status  Achieved      PT SHORT TERM GOAL #3   Title  Pt will demo proper body mechanics to minimize straining abdoinaopelvic area and optimize pelvic floor    Time  1    Period  Weeks    Status  Achieved                Plan - 03/30/19 1354    Clinical Impression Statement  Pt is a 69  yo male who is scheduled for prostatectomy on 04/06/19 . Pt was referred to Pelvic Health PT to train his pelvic floor mm to yield better outcomes with continence post-surgery. Pt 's clinical presentations included signs of poor intraabdominal pressure system which is associated increased risk for urinary incontinence: _diastasis recti ( 3 fingers width)  _dyscoordination of deep core mm _lack of understanding on exercises that places less strain on the abdomen/pelvic floor.   Pt was provided education on etiology of Sx with anatomy, physiology explanation with images along with the benefits of customized pelvic PT Tx based on pt's medical conditions and musculoskeletal deficits.   Following Tx, pt demo'd less abdominal separation from 3  fingers to no fingers width above umbilicus, 1 finger width below umbilicus, and was able to show improved proper deep core mm coordination, including diaphragmatic excursion and ribcage depression. Pt demo'd improved pelvic floor/ deep core coordination and was progressed to quick pelvic floor contraction training.  These improvements  made today will help to optimize his intraabdominal pressure system which can in turn help with continence post -  op.   Pt also required education on increasing water while decreasing bladder irritant in order to promote bladder  health. Pt has achieved his STG and is ready for d/c at this time.     Examination-Activity Limitations  Continence;Toileting    Clinical Decision Making  Moderate    Rehab Potential  Good    PT Frequency  One time visit    PT Treatment/Interventions  Neuromuscular re-education;Cognitive remediation;Therapeutic exercise;Patient/family education;Manual techniques       Patient will benefit from skilled therapeutic intervention in order to improve the following deficits and impairments:  Increased muscle spasms, Postural dysfunction, Improper body mechanics, Decreased strength, Decreased safety awareness, Decreased endurance, Hypomobility  Visit Diagnosis: Diastasis recti  Other lack of coordination     Problem List Patient Active Problem List   Diagnosis Date Noted  . Encounter for screening colonoscopy 10/01/2018  . Wart 11/28/2017  . Medicare annual wellness visit, initial 03/23/2016  . Paroxysmal atrial fibrillation ([place]) 11/29/2015  . BPH (benign prostatic hyperplasia) 11/29/2015  . Hyperlipidemia 03/24/2015    Jerl Mina ,PT, DPT, E-RYT  03/31/2019, 2:02 PM  [place] MAIN [facility] SERVICES [address] [place], [place], [address] Phone: [phone]   Fax:  [phone]  Name: Brian Glass MRN: [id number] Date of Birth: 1950/12/19

## 2019-03-31 NOTE — Pre-Procedure Instructions (Signed)
EKG performed today was reviewed by Dr. Randa Lynn (anesthesia); ok to proceed to surgery without further evaluation. Patient denies any cardiac symptoms at time of interview.

## 2019-04-01 LAB — CULTURE, URINE COMPREHENSIVE

## 2019-04-02 ENCOUNTER — Other Ambulatory Visit
Admission: RE | Admit: 2019-04-02 | Discharge: 2019-04-02 | Disposition: A | Discharge status: Home / Self Care | Payer: Medicare Other | Source: Ambulatory Visit | Attending: Urology | Admitting: Urology

## 2019-04-02 DIAGNOSIS — Z01812 Encounter for preprocedural laboratory examination: Secondary | ICD-10-CM | POA: Insufficient documentation

## 2019-04-02 DIAGNOSIS — Z20822 Contact with and (suspected) exposure to covid-19: Secondary | ICD-10-CM | POA: Diagnosis not present

## 2019-04-02 LAB — SARS CORONAVIRUS 2 (TAT 6-24 HRS): SARS Coronavirus 2: NEGATIVE

## 2019-04-06 ENCOUNTER — Observation Stay
Admission: RE | Admit: 2019-04-06 | Discharge: 2019-04-07 | Disposition: A | Discharge status: Home / Self Care | Payer: Medicare Other | Attending: Urology | Admitting: Urology

## 2019-04-06 ENCOUNTER — Encounter: Payer: Self-pay | Admitting: Urology

## 2019-04-06 ENCOUNTER — Ambulatory Visit: Payer: Medicare Other | Admitting: Anesthesiology

## 2019-04-06 ENCOUNTER — Other Ambulatory Visit: Payer: Self-pay

## 2019-04-06 ENCOUNTER — Encounter
Admission: RE | Disposition: A | Discharge status: Home / Self Care | Payer: Self-pay | Source: Home / Self Care | Attending: Urology

## 2019-04-06 DIAGNOSIS — M1990 Unspecified osteoarthritis, unspecified site: Secondary | ICD-10-CM | POA: Insufficient documentation

## 2019-04-06 DIAGNOSIS — Z888 Allergy status to other drugs, medicaments and biological substances status: Secondary | ICD-10-CM | POA: Insufficient documentation

## 2019-04-06 DIAGNOSIS — Z85828 Personal history of other malignant neoplasm of skin: Secondary | ICD-10-CM | POA: Diagnosis not present

## 2019-04-06 DIAGNOSIS — I4891 Unspecified atrial fibrillation: Secondary | ICD-10-CM | POA: Insufficient documentation

## 2019-04-06 DIAGNOSIS — Z8042 Family history of malignant neoplasm of prostate: Secondary | ICD-10-CM | POA: Diagnosis not present

## 2019-04-06 DIAGNOSIS — E785 Hyperlipidemia, unspecified: Secondary | ICD-10-CM | POA: Diagnosis not present

## 2019-04-06 DIAGNOSIS — C61 Malignant neoplasm of prostate: Secondary | ICD-10-CM | POA: Diagnosis not present

## 2019-04-06 DIAGNOSIS — I480 Paroxysmal atrial fibrillation: Secondary | ICD-10-CM | POA: Diagnosis not present

## 2019-04-06 DIAGNOSIS — Z8546 Personal history of malignant neoplasm of prostate: Secondary | ICD-10-CM | POA: Diagnosis present

## 2019-04-06 HISTORY — PX: ROBOT ASSISTED LAPAROSCOPIC RADICAL PROSTATECTOMY: SHX5141

## 2019-04-06 LAB — ABO/RH: ABO/RH(D): O POS

## 2019-04-06 SURGERY — PROSTATECTOMY, RADICAL, ROBOT-ASSISTED, LAPAROSCOPIC
Anesthesia: General

## 2019-04-06 MED ORDER — ACETAMINOPHEN 325 MG PO TABS: 650.0000 mg | ORAL_TABLET | ORAL | Status: DC | PRN

## 2019-04-06 MED ORDER — MIDAZOLAM HCL 2 MG/2ML IJ SOLN
INTRAMUSCULAR | Status: DC | PRN
  Administered 2019-04-06: 2 mg via INTRAVENOUS

## 2019-04-06 MED ORDER — HYDROMORPHONE HCL 1 MG/ML IJ SOLN
0.5000 mg | INTRAMUSCULAR | Status: DC | PRN
  Administered 2019-04-06 (×2): 0.5 mg via INTRAVENOUS

## 2019-04-06 MED ORDER — PHENYLEPHRINE HCL (PRESSORS) 10 MG/ML IV SOLN
INTRAVENOUS | Status: DC | PRN
  Administered 2019-04-06 (×4): 100 ug via INTRAVENOUS

## 2019-04-06 MED ORDER — ONDANSETRON HCL 4 MG/2ML IJ SOLN: 4.0000 mg | INTRAMUSCULAR | Status: DC | PRN

## 2019-04-06 MED ORDER — FENTANYL CITRATE (PF) 100 MCG/2ML IJ SOLN
INTRAMUSCULAR | Status: AC
  Filled 2019-04-06: qty 2

## 2019-04-06 MED ORDER — BUPIVACAINE HCL 0.5 % IJ SOLN
INTRAMUSCULAR | Status: DC | PRN
  Administered 2019-04-06: 10 mL

## 2019-04-06 MED ORDER — DOCUSATE SODIUM 100 MG PO CAPS
100.0000 mg | ORAL_CAPSULE | Freq: Two times a day (BID) | ORAL | Status: DC
  Administered 2019-04-06 – 2019-04-07 (×2): 100 mg via ORAL
  Filled 2019-04-06 (×2): qty 1

## 2019-04-06 MED ORDER — CEFAZOLIN SODIUM-DEXTROSE 2-4 GM/100ML-% IV SOLN
2.0000 g | INTRAVENOUS | Status: AC
  Administered 2019-04-06: 2 g via INTRAVENOUS

## 2019-04-06 MED ORDER — PROPOFOL 10 MG/ML IV BOLUS
INTRAVENOUS | Status: DC | PRN
  Administered 2019-04-06: 150 mg via INTRAVENOUS

## 2019-04-06 MED ORDER — ONDANSETRON HCL 4 MG/2ML IJ SOLN
INTRAMUSCULAR | Status: DC | PRN
  Administered 2019-04-06: 4 mg via INTRAVENOUS

## 2019-04-06 MED ORDER — SIMETHICONE 80 MG PO CHEW
80.0000 mg | CHEWABLE_TABLET | Freq: Four times a day (QID) | ORAL | Status: DC | PRN
  Administered 2019-04-06: 80 mg via ORAL
  Filled 2019-04-06: qty 1

## 2019-04-06 MED ORDER — CEFAZOLIN SODIUM-DEXTROSE 2-4 GM/100ML-% IV SOLN
INTRAVENOUS | Status: AC
  Filled 2019-04-06: qty 100

## 2019-04-06 MED ORDER — ACETAMINOPHEN 10 MG/ML IV SOLN
INTRAVENOUS | Status: AC
  Filled 2019-04-06: qty 100

## 2019-04-06 MED ORDER — ONDANSETRON HCL 4 MG/2ML IJ SOLN
INTRAMUSCULAR | Status: AC
  Filled 2019-04-06: qty 2

## 2019-04-06 MED ORDER — ACETAMINOPHEN 10 MG/ML IV SOLN
INTRAVENOUS | Status: DC | PRN
  Administered 2019-04-06: 1000 mg via INTRAVENOUS

## 2019-04-06 MED ORDER — THROMBIN 5000 UNITS EX SOLR
CUTANEOUS | Status: AC
  Filled 2019-04-06: qty 5000

## 2019-04-06 MED ORDER — FENTANYL CITRATE (PF) 100 MCG/2ML IJ SOLN
INTRAMUSCULAR | Status: DC | PRN
  Administered 2019-04-06 (×2): 25 ug via INTRAVENOUS
  Administered 2019-04-06 (×2): 50 ug via INTRAVENOUS

## 2019-04-06 MED ORDER — HYDROCODONE-ACETAMINOPHEN 5-325 MG PO TABS
1.0000 | ORAL_TABLET | ORAL | Status: DC | PRN
  Administered 2019-04-06 – 2019-04-07 (×4): 2 via ORAL
  Filled 2019-04-06 (×5): qty 2

## 2019-04-06 MED ORDER — HEPARIN SODIUM (PORCINE) 5000 UNIT/ML IJ SOLN
INTRAMUSCULAR | Status: AC
  Filled 2019-04-06: qty 1

## 2019-04-06 MED ORDER — PROPOFOL 10 MG/ML IV BOLUS
INTRAVENOUS | Status: AC
  Filled 2019-04-06: qty 20

## 2019-04-06 MED ORDER — CHLORHEXIDINE GLUCONATE CLOTH 2 % EX PADS
6.0000 | MEDICATED_PAD | Freq: Every day | CUTANEOUS | Status: DC
  Administered 2019-04-07 (×2): 6 via TOPICAL

## 2019-04-06 MED ORDER — DEXAMETHASONE SODIUM PHOSPHATE 10 MG/ML IJ SOLN
INTRAMUSCULAR | Status: AC
  Filled 2019-04-06: qty 1

## 2019-04-06 MED ORDER — OXYBUTYNIN CHLORIDE 5 MG PO TABS
5.0000 mg | ORAL_TABLET | Freq: Three times a day (TID) | ORAL | Status: DC | PRN
  Administered 2019-04-06: 5 mg via ORAL
  Filled 2019-04-06 (×2): qty 1

## 2019-04-06 MED ORDER — LIDOCAINE HCL (CARDIAC) PF 100 MG/5ML IV SOSY
PREFILLED_SYRINGE | INTRAVENOUS | Status: DC | PRN
  Administered 2019-04-06: 100 mg via INTRAVENOUS

## 2019-04-06 MED ORDER — OXYBUTYNIN CHLORIDE 5 MG PO TABS
ORAL_TABLET | ORAL | Status: AC
  Filled 2019-04-06: qty 1

## 2019-04-06 MED ORDER — THROMBIN 5000 UNITS EX SOLR
CUTANEOUS | Status: DC | PRN
  Administered 2019-04-06: 5000 [IU] via TOPICAL

## 2019-04-06 MED ORDER — SODIUM CHLORIDE 0.9 % IV BOLUS
1000.0000 mL | Freq: Once | INTRAVENOUS | Status: AC
  Administered 2019-04-06: 1000 mL via INTRAVENOUS

## 2019-04-06 MED ORDER — HEPARIN SODIUM (PORCINE) 5000 UNIT/ML IJ SOLN
5000.0000 [IU] | INTRAMUSCULAR | Status: AC
  Administered 2019-04-06: 5000 [IU] via SUBCUTANEOUS

## 2019-04-06 MED ORDER — FAMOTIDINE 20 MG PO TABS
20.0000 mg | ORAL_TABLET | Freq: Once | ORAL | Status: AC
  Administered 2019-04-06: 20 mg via ORAL

## 2019-04-06 MED ORDER — LIDOCAINE HCL (PF) 2 % IJ SOLN
INTRAMUSCULAR | Status: AC
  Filled 2019-04-06: qty 10

## 2019-04-06 MED ORDER — PHENYLEPHRINE HCL (PRESSORS) 10 MG/ML IV SOLN
INTRAVENOUS | Status: AC
  Filled 2019-04-06: qty 1

## 2019-04-06 MED ORDER — SUGAMMADEX SODIUM 200 MG/2ML IV SOLN
INTRAVENOUS | Status: DC | PRN
  Administered 2019-04-06: 150 mg via INTRAVENOUS

## 2019-04-06 MED ORDER — ROCURONIUM BROMIDE 100 MG/10ML IV SOLN
INTRAVENOUS | Status: DC | PRN
  Administered 2019-04-06 (×2): 50 mg via INTRAVENOUS

## 2019-04-06 MED ORDER — ONDANSETRON HCL 4 MG/2ML IJ SOLN: 4.0000 mg | Freq: Once | INTRAMUSCULAR | Status: DC | PRN

## 2019-04-06 MED ORDER — FAMOTIDINE 20 MG PO TABS
ORAL_TABLET | ORAL | Status: AC
  Filled 2019-04-06: qty 1

## 2019-04-06 MED ORDER — OXYCODONE HCL 5 MG PO TABS
ORAL_TABLET | ORAL | Status: AC
  Filled 2019-04-06: qty 1

## 2019-04-06 MED ORDER — SUGAMMADEX SODIUM 200 MG/2ML IV SOLN
INTRAVENOUS | Status: AC
  Filled 2019-04-06: qty 2

## 2019-04-06 MED ORDER — BUPIVACAINE HCL (PF) 0.5 % IJ SOLN
INTRAMUSCULAR | Status: AC
  Filled 2019-04-06: qty 30

## 2019-04-06 MED ORDER — LACTATED RINGERS IV SOLN: INTRAVENOUS | Status: DC

## 2019-04-06 MED ORDER — DEXAMETHASONE SODIUM PHOSPHATE 10 MG/ML IJ SOLN
INTRAMUSCULAR | Status: DC | PRN
  Administered 2019-04-06: 10 mg via INTRAVENOUS

## 2019-04-06 MED ORDER — HYDROMORPHONE HCL 1 MG/ML IJ SOLN
INTRAMUSCULAR | Status: AC
  Filled 2019-04-06: qty 1

## 2019-04-06 MED ORDER — EPHEDRINE SULFATE 50 MG/ML IJ SOLN
INTRAMUSCULAR | Status: AC
  Filled 2019-04-06: qty 1

## 2019-04-06 MED ORDER — MIDAZOLAM HCL 2 MG/2ML IJ SOLN
INTRAMUSCULAR | Status: AC
  Filled 2019-04-06: qty 2

## 2019-04-06 MED ORDER — HYDROMORPHONE HCL 1 MG/ML IJ SOLN: 0.5000 mg | INTRAMUSCULAR | Status: DC | PRN

## 2019-04-06 MED ORDER — OXYCODONE HCL 5 MG PO TABS
5.0000 mg | ORAL_TABLET | Freq: Once | ORAL | Status: AC
  Administered 2019-04-06: 5 mg via ORAL

## 2019-04-06 MED ORDER — FENTANYL CITRATE (PF) 100 MCG/2ML IJ SOLN
25.0000 ug | INTRAMUSCULAR | Status: AC | PRN
  Administered 2019-04-06 (×8): 25 ug via INTRAVENOUS

## 2019-04-06 MED ORDER — EPHEDRINE SULFATE 50 MG/ML IJ SOLN
INTRAMUSCULAR | Status: DC | PRN
  Administered 2019-04-06: 5 mg via INTRAVENOUS
  Administered 2019-04-06: 10 mg via INTRAVENOUS
  Administered 2019-04-06 (×2): 5 mg via INTRAVENOUS

## 2019-04-06 MED ORDER — ROCURONIUM BROMIDE 50 MG/5ML IV SOLN
INTRAVENOUS | Status: AC
  Filled 2019-04-06: qty 1

## 2019-04-06 SURGICAL SUPPLY — 100 items
ANCHOR TIS RET SYS 235ML (MISCELLANEOUS) ×3 IMPLANT
APPLICATOR SURGIFLO ENDO (HEMOSTASIS) ×3 IMPLANT
BAG URINE DRAIN 2000ML AR STRL (UROLOGICAL SUPPLIES) ×3 IMPLANT
BLADE CLIPPER SURG (BLADE) ×3 IMPLANT
BULB RESERV EVAC DRAIN JP 100C (MISCELLANEOUS) IMPLANT
CANISTER SUCT 1200ML W/VALVE (MISCELLANEOUS) ×3 IMPLANT
CATH FOL 2WAY LX 18X5 (CATHETERS) ×6 IMPLANT
CATH FOL 2WAY LX 20X5 (CATHETERS) ×3 IMPLANT
CHLORAPREP W/TINT 26 (MISCELLANEOUS) ×6 IMPLANT
CLIP VESOLOCK LG 6/CT PURPLE (CLIP) ×9 IMPLANT
CORD ROBOTIC MONO REUSE 10FT (INSTRUMENTS) IMPLANT
COVER TIP SHEARS 8 DVNC (MISCELLANEOUS) ×2 IMPLANT
COVER TIP SHEARS 8MM DA VINCI (MISCELLANEOUS) ×1
COVER WAND RF STERILE (DRAPES) ×3 IMPLANT
DEFOGGER SCOPE WARMER CLEARIFY (MISCELLANEOUS) ×3 IMPLANT
DERMABOND ADVANCED (GAUZE/BANDAGES/DRESSINGS) ×1
DERMABOND ADVANCED .7 DNX12 (GAUZE/BANDAGES/DRESSINGS) ×2 IMPLANT
DRAIN CHANNEL JP 15F RND 16 (MISCELLANEOUS) IMPLANT
DRAIN CHANNEL JP 19F (MISCELLANEOUS) IMPLANT
DRAPE 3/4 80X56 (DRAPES) ×6 IMPLANT
DRAPE ARM DVNC X/XI (DISPOSABLE) ×8 IMPLANT
DRAPE COLUMN DVNC XI (DISPOSABLE) ×2 IMPLANT
DRAPE DA VINCI XI ARM (DISPOSABLE) ×4
DRAPE DA VINCI XI COLUMN (DISPOSABLE) ×1
DRAPE LEGGINS SURG 28X43 STRL (DRAPES) ×3 IMPLANT
DRAPE SURG 17X11 SM STRL (DRAPES) ×12 IMPLANT
DRAPE UNDER BUTTOCK W/FLU (DRAPES) ×3 IMPLANT
DRIVER LRG NEEDLE DA VINCI (INSTRUMENTS)
DRIVER NDLE LRG DVNC (INSTRUMENTS) IMPLANT
DRSG TELFA 3X8 NADH (GAUZE/BANDAGES/DRESSINGS) ×3 IMPLANT
ELECT REM PT RETURN 9FT ADLT (ELECTROSURGICAL) ×3
ELECTRODE REM PT RTRN 9FT ADLT (ELECTROSURGICAL) ×2 IMPLANT
FORCEPS BIPOLAR XI FENES DVNC (FORCEP)
FORCEPS BPLR FENES DVNC XI (FORCEP) IMPLANT
FORCEPS MARYLAND BIPOLAR 8X55 (INSTRUMENTS)
FORCEPS MRYLND BPLR 8X55 DVNC (INSTRUMENTS) IMPLANT
GLOVE BIOGEL PI IND STRL 7.5 (GLOVE) ×8 IMPLANT
GLOVE BIOGEL PI INDICATOR 7.5 (GLOVE) ×4
GOWN STRL REUS W/ TWL LRG LVL3 (GOWN DISPOSABLE) ×4 IMPLANT
GOWN STRL REUS W/ TWL XL LVL3 (GOWN DISPOSABLE) ×4 IMPLANT
GOWN STRL REUS W/TWL LRG LVL3 (GOWN DISPOSABLE) ×2
GOWN STRL REUS W/TWL XL LVL3 (GOWN DISPOSABLE) ×2
GRASPER SUT TROCAR 14GX15 (MISCELLANEOUS) ×3 IMPLANT
HEMOSTAT SURGICEL 2X14 (HEMOSTASIS) IMPLANT
HOLDER FOLEY CATH W/STRAP (MISCELLANEOUS) ×3 IMPLANT
IRRIGATION STRYKERFLOW (MISCELLANEOUS) ×2 IMPLANT
IRRIGATOR STRYKERFLOW (MISCELLANEOUS) ×3
IV LACTATED RINGERS 1000ML (IV SOLUTION) ×3 IMPLANT
KIT PINK PAD W/HEAD ARE REST (MISCELLANEOUS) ×3
KIT PINK PAD W/HEAD ARM REST (MISCELLANEOUS) ×2 IMPLANT
LABEL OR SOLS (LABEL) ×3 IMPLANT
MARKER SKIN DUAL TIP RULER LAB (MISCELLANEOUS) ×3 IMPLANT
NEEDLE HYPO 22GX1.5 SAFETY (NEEDLE) ×3 IMPLANT
NEEDLE INSUFFLATION 14GA 120MM (NEEDLE) ×3 IMPLANT
NS IRRIG 500ML POUR BTL (IV SOLUTION) ×3 IMPLANT
OBTURATOR OPTICAL STANDARD 8MM (TROCAR) ×1
OBTURATOR OPTICAL STND 8 DVNC (TROCAR) ×2
OBTURATOR OPTICALSTD 8 DVNC (TROCAR) ×2 IMPLANT
PACK LAP CHOLECYSTECTOMY (MISCELLANEOUS) ×3 IMPLANT
PENCIL ELECTRO HAND CTR (MISCELLANEOUS) ×3 IMPLANT
PROGRASP ENDOWRIST DA VINCI (INSTRUMENTS)
PROGRASP ENDOWRIST DVNC (INSTRUMENTS) IMPLANT
RELOAD STAPLER WHITE 60MM (STAPLE) ×2 IMPLANT
SCISSORS MNPLR CVD DVNC XI (INSTRUMENTS) IMPLANT
SCISSORS XI MNPLR CVD DVNC (INSTRUMENTS)
SEAL CANN UNIV 5-8 DVNC XI (MISCELLANEOUS) ×8 IMPLANT
SEAL XI 5MM-8MM UNIVERSAL (MISCELLANEOUS) ×4
SET TUBE SMOKE EVAC HIGH FLOW (TUBING) ×3 IMPLANT
SOLUTION ELECTROLUBE (MISCELLANEOUS) ×3 IMPLANT
SPOGE SURGIFLO 8M (HEMOSTASIS) ×1
SPONGE LAP 4X18 RFD (DISPOSABLE) ×3 IMPLANT
SPONGE SURGIFLO 8M (HEMOSTASIS) ×2 IMPLANT
SPONGE VERSALON 4X4 4PLY (MISCELLANEOUS) IMPLANT
STAPLE ECHEON FLEX 60 POW ENDO (STAPLE) ×3 IMPLANT
STAPLER RELOAD WHITE 60MM (STAPLE) ×3
STAPLER SKIN PROX 35W (STAPLE) ×3 IMPLANT
STRAP SAFETY 5IN WIDE (MISCELLANEOUS) ×3 IMPLANT
SURGILUBE 2OZ TUBE FLIPTOP (MISCELLANEOUS) ×3 IMPLANT
SUT DVC VLOC 90 3-0 CV23 UNDY (SUTURE) ×6 IMPLANT
SUT DVC VLOC 90 3-0 CV23 VLT (SUTURE) ×3
SUT ETHILON 3-0 FS-10 30 BLK (SUTURE)
SUT MNCRL 4-0 (SUTURE) ×2
SUT MNCRL 4-0 27XMFL (SUTURE) ×4
SUT PROLENE 5 0 RB 1 DA (SUTURE) IMPLANT
SUT SILK 2 0 SH (SUTURE) IMPLANT
SUT VIC AB 0 CT1 36 (SUTURE) ×6 IMPLANT
SUT VIC AB 2-0 CT1 (SUTURE) IMPLANT
SUT VIC AB 2-0 SH 27 (SUTURE) ×2
SUT VIC AB 2-0 SH 27XBRD (SUTURE) ×4 IMPLANT
SUT VIC AB 2-0 UR6 27 (SUTURE) ×3 IMPLANT
SUT VICRYL 0 AB UR-6 (SUTURE) ×3 IMPLANT
SUTURE DVC VLC 90 3-0 CV23 VLT (SUTURE) ×2 IMPLANT
SUTURE EHLN 3-0 FS-10 30 BLK (SUTURE) IMPLANT
SUTURE MNCRL 4-0 27XMF (SUTURE) ×4 IMPLANT
SYR 10ML LL (SYRINGE) ×3 IMPLANT
SYR BULB IRRIG 60ML STRL (SYRINGE) ×3 IMPLANT
SYRINGE IRR TOOMEY STRL 70CC (SYRINGE) ×3 IMPLANT
TAPE CLOTH 3X10 WHT NS LF (GAUZE/BANDAGES/DRESSINGS) ×3 IMPLANT
TROCAR ENDOPATH XCEL 12X100 BL (ENDOMECHANICALS) ×3 IMPLANT
TROCAR XCEL NON-BLD 5MMX100MML (ENDOMECHANICALS) ×3 IMPLANT

## 2019-04-06 NOTE — Anesthesia Preprocedure Evaluation (Addendum)
Anesthesia Evaluation  Patient identified by MRN, date of birth, ID band Patient awake    Reviewed: Allergy & Precautions, H&P , NPO status , Patient's Chart, lab work & pertinent test results  Airway Mallampati: II  TM Distance: >3 FB Neck ROM: Full    Dental no notable dental hx. (+) Teeth Intact, Dental Advisory Given   Pulmonary neg pulmonary ROS,    Pulmonary exam normal breath sounds clear to auscultation       Cardiovascular Exercise Tolerance: Good + dysrhythmias Atrial Fibrillation  Rhythm:Irregular Rate:Normal     Neuro/Psych negative neurological ROS  negative psych ROS   GI/Hepatic negative GI ROS, Neg liver ROS,   Endo/Other  negative endocrine ROS  Renal/GU negative Renal ROS  negative genitourinary   Musculoskeletal  (+) Arthritis , Osteoarthritis,    Abdominal   Peds  Hematology negative hematology ROS (+)   Anesthesia Other Findings Past Medical History: No date: Allergy No date: Arthritis No date: Atrial fibrillation ([place]) No date: Bilateral vitreous detachment No date: Chickenpox 2018: Hx of basal cell carcinoma     Comment:  L malar cheek No date: Typical atrial flutter ([facility]CC Hx of afib and ablation. Prostate CA  Reproductive/Obstetrics negative OB ROS                           Anesthesia Physical  Anesthesia Plan  ASA: III  Anesthesia Plan: General   Post-op Pain Management:    Induction: Intravenous  PONV Risk Score and Plan:   Airway Management Planned: Oral ETT  Additional Equipment:   Intra-op Plan:   Post-operative Plan: Extubation in OR  Informed Consent: I have reviewed the patients History and Physical, chart, labs and discussed the procedure including the risks, benefits and alternatives for the proposed anesthesia with the patient or authorized representative who has indicated his/her understanding and acceptance.     Dental advisory  given  Plan Discussed with: CRNA and Surgeon  Anesthesia Plan Comments:        Anesthesia Quick Evaluation

## 2019-04-06 NOTE — OR Nursing (Signed)
Patient is having urgency feeling and right sided lower abdominal pain that radiates to back, he is tolerating fluids and crackers.  Spoke with Dr. Andree Elk and he gave an order for oxy IR 5 mg PO.   After forty minutes of administering patient still having facial grimacing and pain called Dr. Ronelle Nigh who gave permission for dilaudid.  Patient's  liter bolus is in. Will continue to monitor

## 2019-04-06 NOTE — OR Nursing (Signed)
Notified Dr. Diamantina Providence that patient only had about 150 ml output since OR and he has received 2,500 ml in fluids. Catheter seems to be draining fine.  He came to bedside and flushed foley and said to continue fluids and POs.

## 2019-04-06 NOTE — Transfer of Care (Signed)
Immediate Anesthesia Transfer of Care Note  Patient: Brian Glass  Procedure(s) Performed: XI ROBOTIC ASSISTED LAPAROSCOPIC RADICAL PROSTATECTOMY (N/A )  Patient Location: PACU  Anesthesia Type:General  Level of Consciousness: awake  Airway & Oxygen Therapy: Patient connected to face mask oxygen  Post-op Assessment: Post -op Vital signs reviewed and stable  Post vital signs: Reviewed  Last Vitals:  Vitals Value Taken Time  BP 126/80 04/06/19 1049  Temp    Pulse 64 04/06/19 1050  Resp 16 04/06/19 1050  SpO2 99 % 04/06/19 1050  Vitals shown include unvalidated device data.  Last Pain:  Vitals:   04/06/19 1048  TempSrc:   PainSc: (P) 0-No pain         Complications: No apparent anesthesia complications

## 2019-04-06 NOTE — Op Note (Addendum)
Date of procedure: 04/06/19  Preoperative diagnosis: 1. Favorable intermediate risk prostate cancer  Postoperative diagnosis: 1. Same  Procedure: 1. Da Vinci robotic prostatectomy  Surgeon: [name], MD  Assistant:Ashley Erlene Quan, MD  Anesthesia: General  Complications:None  Intraoperative findings: 1.Uncomplicated robotic prostatectomy 2.Watertight urethral anastomosis  EBL:100 cc  Specimens: 1.Prostate and seminal vesicles  Drains:20 [place] Foley  Indication:Brian Glass is a 26 year oldpatient with favorable intermediate risk prostate cancer.We discussed treatment options including surgery or radiation, and he elected for robotic prostatectomy. After reviewing the management options for treatment, they elected to proceed with the above surgical procedure(s). We have discussed the potential benefits and risks of the procedure, side effects of the proposed treatment, the likelihood of the patient achieving the goals of the procedure, and any potential problems that might occur during the procedure or recuperation. Informed consent has been obtained.  Description of procedure:  The patient was taken to the operating room and general anesthesia was induced. SCDs were placed for DVT prophylaxis,and 5000 units subcutaneous heparin were given. The patient was placed in the dorsal lithotomy position,carefully padded with the arms against the sides,prepped and draped in the usual sterile fashion, and preoperative antibiotics(cefazolin) were administered. A preoperative time-out was performed.  On the field, an 27 [place] Foley catheter passed easily into the bladder with return of clear yellow urine. An 8 mm incision was made above the umbilicus, and a Veress needle was used to obtain access to the peritoneum. The abdomen was insufflated to 15 mmHg,and the robotic ports were placed in standard fashion under direct vision. Thorough  inspection of the abdomen showed no bowel injury or other abnormal findings. Lidocaine was injected into all port sites prior to placement. The patient was then placed in steep Trendelenburg position and the robot was docked.  I started by entering the space of Retzius and taking down the bladder. Once this was free to the vas deferens bilaterally, the periprostatic fat was cleaned off the prostate. The endopelvic fascia was opened bilaterally, and muscle fibersspared laterally.The periprostaticligaments were carefully taken down. The DVC was isolated, and ligated using a 60 mm load vascular stapler.  I then turned my attention to the anterior bladder neck, and this was incised down to the level of the Foley catheter. The Foley catheter was removed from the bladder and gently held upward for traction. The posterior bladder neck was carefully divided, and we were able to find the plane between the prostate and the seminal vesicles. At this point, Weck clips were used to take down the pedicles laterally and maximize exposure for the dissection of the seminal vesicles. The ampulla of the vas was divided bilaterally, and held up for retraction. The seminal vesicles were then dissected out bilaterally intact. [place] fascia was opened posteriorly to the prostate and freed laterally. The remaining vascular pedicle was divided using Weck clips and cold scissors.An excellent nerve spare was performed bilaterally. The prostate was then gently retracted superiorly, and the apex was dissected free. Once the urethra was identified, the use of electrocautery was minimized. The urethra was sharply entered and the Foley catheter removed. The posterior urethral plate was divided.It was placed in an Endo Catch bag and moved out of the field. There was good hemostasis in the pelvis.  A 3-0V lock stitch was used to reapproximate the tissue behind the bladder and posteriorlyto the urethra as  described by Rocco in order to remove tension from the anastomosis.Two3-0 V lock stitches were connected and used for  the urethrovesical anastomosis. A running anastomosis was performed circumferentially and there appeared to be excellent approximation of the mucosa on both sides. A new 20 [place] Foley passed easily into the bladder, and 150 cc normal saline were instilled with no leakage noted in the operative field. The sutures were tied anteriorly. 12 cc were placed in the balloon. There was good hemostasis. A small piece of Surgicel was placed on either side of the anastomosis along the endopelvic fascia in addition to Surgi-Flo. All instruments were removed and the robot was undocked.  A Carter-Thomason device was used to close the right lateral 11 mm port under direct vision laparoscopically. The supraumbilical incision was extended superiorly, and the specimen was removed. Thefascia of thesmall midline incision was closed using a running 0 Vicryl. All port sites were copiously irrigated. There was good hemostasis. A running 4-0 Monocryl was used to close all incisions,and Dermabond was applied.  All counts were correct, and the patient was awakened and taken to PACU in stable condition.  Disposition: Stable to PACU  Plan: Likelydischarge home tomorrowwith catheter in place for 1 week  [name], MD 04/06/2019

## 2019-04-06 NOTE — Anesthesia Postprocedure Evaluation (Signed)
Anesthesia Post Note  Patient: Brian Glass  Procedure(s) Performed: XI ROBOTIC ASSISTED LAPAROSCOPIC RADICAL PROSTATECTOMY (N/A )  Patient location during evaluation: PACU Anesthesia Type: General Level of consciousness: awake and alert Pain management: pain level controlled Vital Signs Assessment: post-procedure vital signs reviewed and stable Respiratory status: spontaneous breathing and respiratory function stable Cardiovascular status: stable Anesthetic complications: no     Last Vitals:  Vitals:   04/06/19 1134 04/06/19 1149  BP: 111/76 110/75  Pulse: 71 75  Resp: (!) 9 13  Temp:    SpO2: 99% 95%    Last Pain:  Vitals:   04/06/19 1139  TempSrc:   PainSc: 6                  Shelita Steptoe K

## 2019-04-06 NOTE — Anesthesia Procedure Notes (Signed)
Procedure Name: Intubation Date/Time: 04/06/2019 7:44 AM Performed by: Aline Brochure, CRNA Pre-anesthesia Checklist: Patient identified, Emergency Drugs available, Suction available and Patient being monitored Patient Re-evaluated:Patient Re-evaluated prior to induction Oxygen Delivery Method: Circle system utilized Preoxygenation: Pre-oxygenation with 100% oxygen Induction Type: IV induction Ventilation: Mask ventilation without difficulty Laryngoscope Size: McGraph and 4 Grade View: Grade I Tube type: Oral Tube size: 7.5 mm Number of attempts: 1 Airway Equipment and Method: Stylet and Video-laryngoscopy Placement Confirmation: ETT inserted through vocal cords under direct vision,  positive ETCO2 and breath sounds checked- equal and bilateral Secured at: 23 cm Tube secured with: Tape Dental Injury: Teeth and Oropharynx as per pre-operative assessment

## 2019-04-06 NOTE — H&P (Signed)
04/06/19 7:05 AM   Brian Glass [birth year] [id number]   HPI: Brian Glass is a healthy 69 year old male with new diagnosis of favorable intermediate risk prostate cancer here today for robotic prostatectomy.  Prostate biopsy was performed on 01/26/2019 for an elevated PSA of 5.9, and showed a 38 g prostate with a PSA density of 0.15, no significant hypoechoic or median lobe, and 4/12 cores positive for primarily 3+3 =6 prostate cancer, with 1 core of 3+4 = 7 disease with max core involvement of 20%.  He denies any erectile dysfunction at baseline.  He does have some bothersome urinary symptoms of weak stream and difficulty emptying, and has been on Flomax for over 10 years.  He denies any chest pain or shortness of breath.     PMH: Past Medical History:  Diagnosis Date  . Allergy   . Arthritis   . Atrial fibrillation ([place])   . Bilateral vitreous detachment   . Chickenpox   . Hx of basal cell carcinoma 2018   L malar cheek  . Prostate cancer ([place]) 2020  . Typical atrial flutter [facility])     Surgical History: Past Surgical History:  Procedure Laterality Date  . ATRIAL FIBRILLATION ABLATION N/A 05/10/2016   Procedure: Atrial Fibrillation Ablation;  Surgeon: Brian Grayer, MD;  Location: [place] CV LAB;  Service: Cardiovascular;  Laterality: N/A;  . CATARACT EXTRACTION, BILATERAL  2016  . COLONOSCOPY WITH PROPOFOL N/A 10/20/2018   Procedure: COLONOSCOPY WITH PROPOFOL;  Surgeon: Brian Landsman, MD;  Location: [facility] ENDOSCOPY;  Service: Gastroenterology;  Laterality: N/A;  . EYE SURGERY    . MOHS SURGERY Left 2018   malar cheek, BCC  . TONSILLECTOMY  1955      Allergies:  Allergies  Allergen Reactions  . Sudafed [Pseudoephedrine Hcl]     Hard to urinate     Family History: Family History  Problem Relation Age of Onset  . Hyperlipidemia Mother   . Heart disease Mother   . Hypertension Mother   . Hyperlipidemia Father   . Heart disease Father   . Stroke  Father   . Hypertension Father   . Heart attack Father   . Heart disease Brother   . Prostate cancer Brother   . Atrial fibrillation Brother   . Sleep apnea Brother     Social History:  reports that he has never smoked. He quit smokeless tobacco use about 51 years ago.  His smokeless tobacco use included chew. He reports current alcohol use of about 2.0 standard drinks of alcohol per week. He reports that he does not use drugs.  ROS: Please see flowsheet from today's date for complete review of systems.  Physical Exam: BP 108/82   Pulse 77   Temp (!) 97.5 F (36.4 C) (Tympanic)   Resp 16   Ht 5\' 10"  (1.778 m)   Wt 72.6 kg   SpO2 100%   BMI 22.96 kg/m    Constitutional:  Alert and oriented, No acute distress. Cardiovascular: Regular rate and rhythm Respiratory: Clear to auscultation bilaterally GI: Abdomen is soft, nontender, nondistended, no abdominal masses Lymph: No cervical or inguinal lymphadenopathy. Skin: No rashes, bruises or suspicious lesions. Neurologic: Grossly intact, no focal deficits, moving all 4 extremities. Psychiatric: Normal mood and affect.  Assessment & Plan:   In summary, the patient is a 69 year old healthy male with a family history of aggressive prostate cancer who was recently diagnosed with favorable intermediate risk prostate cancer and is elected for robotic prostatectomy.  In regards to surgery, we discussed robotic prostatectomy at length.  The procedure takes 3 to 4 hours, and patient's typically discharge home on post-op day #1.  A Foley catheter is left in place for 7 to 10 days to allow for healing of the vesicourethral anastomosis.  There is a small risk of bleeding, infection, damage to surrounding structures or bowel, hernia, DVT/PE, or serious cardiac or pulmonary complications.  We discussed at length post-op side effects including erectile dysfunction, and the importance of pre-operative erectile function on long-term outcomes.  Even with  a nerve sparing approach, there is an approximately 25% rate of permanent erectile dysfunction.  We also discussed postop urinary incontinence at length.  We expect patients to have stress incontinence post-operatively that will improve over period of weeks to months.  Less than 10% of men will require a pad at 1 year after surgery.  Patients will need to avoid heavy lifting and strenuous activity for 3 to 4 weeks, but most men return to their baseline activity status by 6 weeks.  Brian Glass, [facility] [address] [place], [place] [address] [phone]

## 2019-04-07 DIAGNOSIS — I4891 Unspecified atrial fibrillation: Secondary | ICD-10-CM | POA: Diagnosis not present

## 2019-04-07 DIAGNOSIS — Z85828 Personal history of other malignant neoplasm of skin: Secondary | ICD-10-CM | POA: Diagnosis not present

## 2019-04-07 DIAGNOSIS — Z8042 Family history of malignant neoplasm of prostate: Secondary | ICD-10-CM | POA: Diagnosis not present

## 2019-04-07 DIAGNOSIS — Z888 Allergy status to other drugs, medicaments and biological substances status: Secondary | ICD-10-CM | POA: Diagnosis not present

## 2019-04-07 DIAGNOSIS — M1990 Unspecified osteoarthritis, unspecified site: Secondary | ICD-10-CM | POA: Diagnosis not present

## 2019-04-07 DIAGNOSIS — C61 Malignant neoplasm of prostate: Secondary | ICD-10-CM

## 2019-04-07 LAB — BASIC METABOLIC PANEL
Anion gap: 9 (ref 5–15)
BUN: 18 mg/dL (ref 8–23)
CO2: 27 mmol/L (ref 22–32)
Calcium: 8.6 mg/dL — ABNORMAL LOW (ref 8.9–10.3)
Chloride: 102 mmol/L (ref 98–111)
Creatinine, Ser: 1.01 mg/dL (ref 0.61–1.24)
GFR calc Af Amer: >60 mL/min (ref >60)
GFR calc non Af Amer: >60 mL/min (ref >60)
Glucose, Bld: 123 mg/dL — ABNORMAL HIGH (ref 70–99)
Potassium: 4.7 mmol/L (ref 3.5–5.1)
Sodium: 138 mmol/L (ref 135–145)

## 2019-04-07 LAB — CBC
HCT: 37.1 % — ABNORMAL LOW (ref 39.0–52.0)
Hemoglobin: 12.8 g/dL — ABNORMAL LOW (ref 13.0–17.0)
MCH: 30.4 pg (ref 26.0–34.0)
MCHC: 34.5 g/dL (ref 30.0–36.0)
MCV: 88.1 fL (ref 80.0–100.0)
Platelets: 133 10*3/uL — ABNORMAL LOW (ref 150–400)
RBC: 4.21 MIL/uL — ABNORMAL LOW (ref 4.22–5.81)
RDW: 12.1 % (ref 11.5–15.5)
WBC: 13.2 10*3/uL — ABNORMAL HIGH (ref 4.0–10.5)
nRBC: 0 % (ref 0.0–0.2)

## 2019-04-07 MED ORDER — HYDROCODONE-ACETAMINOPHEN 5-325 MG PO TABS: 1.0000 | ORAL_TABLET | ORAL | 0 refills | Status: AC | PRN

## 2019-04-07 MED ORDER — DOCUSATE SODIUM 100 MG PO CAPS: 100.0000 mg | ORAL_CAPSULE | Freq: Two times a day (BID) | ORAL | 0 refills | Status: DC

## 2019-04-07 NOTE — Progress Notes (Signed)
Pt discharged per MD order.IV removed. Discharge instructions reviewed with pt. Pt verbalized understanding. RN demonstated to pt how to change between leg bad and overgnight bag for his foley. RN gave teaching on how to care for foley catheter. Pt verbalized understanding. Pt taken to car in wheelchair  By staff.

## 2019-04-07 NOTE — Discharge Summary (Signed)
Physician Discharge Summary  Patient ID: Brian Glass MRN: [id number] DOB/AGE: [birth year] 69 y.o.  Admit date: 04/06/2019 Discharge date: 04/07/2019  Admission Diagnoses: Prostate cancer  Discharge Diagnoses: Same  Discharged Condition: good  Hospital Course:  [name] is a healthy 69 year old male who underwent an uncomplicated robotic prostatectomy for favorable intermediate risk prostate cancer.  On postop day #1 urine was clear yellow, pain was well controlled, he was tolerating general diet, passing gas, and was discharged home in good condition.  Treatments: Robotic prostatectomy  Discharge Exam: Blood pressure 130/85, pulse 71, temperature 98.4 F (36.9 C), temperature source Oral, resp. rate 17, height 5\' 10"  (1.778 m), weight 72.6 kg, SpO2 99 %.  Alert, no acute distress Abdomen soft, nontender, incisions clean dry and intact with Dermabond 20 [place] two-way Foley with clear yellow urine   Follow-up in 1 week in urology clinic for Foley removal and to discuss pathology  Greater than 30 minutes were spent face-to-face with the patient discussing hospital course, follow-up plan, and discharge instructions.  Signed: Billey Co 04/07/2019, 8:26 AM

## 2019-04-10 ENCOUNTER — Telehealth: Payer: Self-pay

## 2019-04-10 LAB — SURGICAL PATHOLOGY

## 2019-04-10 NOTE — Telephone Encounter (Signed)
ERROR. See mychart

## 2019-04-16 ENCOUNTER — Other Ambulatory Visit: Payer: Self-pay

## 2019-04-16 ENCOUNTER — Encounter: Payer: Self-pay | Admitting: Urology

## 2019-04-16 ENCOUNTER — Ambulatory Visit (INDEPENDENT_AMBULATORY_CARE_PROVIDER_SITE_OTHER): Payer: Medicare Other | Admitting: Urology

## 2019-04-16 VITALS — BP 101/67 | HR 65 | Ht 70.0 in | Wt 165.0 lb

## 2019-04-16 DIAGNOSIS — C61 Malignant neoplasm of prostate: Secondary | ICD-10-CM

## 2019-04-16 NOTE — Patient Instructions (Signed)
Kegel Exercises  Kegel exercises can help strengthen your pelvic floor muscles. The pelvic floor is a group of muscles that support your rectum, small intestine, and bladder. In females, pelvic floor muscles also help support the womb (uterus). These muscles help you control the flow of urine and stool. Kegel exercises are painless and simple, and they do not require any equipment. Your provider may suggest Kegel exercises to:  Improve bladder and bowel control.  Improve sexual response.  Improve weak pelvic floor muscles after surgery to remove the uterus (hysterectomy) or pregnancy (females).  Improve weak pelvic floor muscles after prostate gland removal or surgery (males). Kegel exercises involve squeezing your pelvic floor muscles, which are the same muscles you squeeze when you try to stop the flow of urine or keep from passing gas. The exercises can be done while sitting, standing, or lying down, but it is best to vary your position. Exercises How to do Kegel exercises: 1. Squeeze your pelvic floor muscles tight. You should feel a tight lift in your rectal area. If you are a male, you should also feel a tightness in your vaginal area. Keep your stomach, buttocks, and legs relaxed. 2. Hold the muscles tight for up to 10 seconds. 3. Breathe normally. 4. Relax your muscles. 5. Repeat as told by your health care provider. Repeat this exercise daily as told by your health care provider. Continue to do this exercise for at least 4-6 weeks, or for as long as told by your health care provider. You may be referred to a physical therapist who can help you learn more about how to do Kegel exercises. Depending on your condition, your health care provider may recommend:  Varying how long you squeeze your muscles.  Doing several sets of exercises every day.  Doing exercises for several weeks.  Making Kegel exercises a part of your regular exercise routine. This information is not intended  to replace advice given to you by your health care provider. Make sure you discuss any questions you have with your health care provider. Document Revised: 10/23/2017 Document Reviewed: 10/23/2017 Elsevier Patient Education  Brian Glass Laparoscopic Prostatectomy, Care After This sheet gives you information about how to care for yourself after your procedure. Your health care provider may also give you more specific instructions. If you have problems or questions, contact your health care provider. What can I expect after the procedure? After the procedure, it is common to have:  Mild pain in your lower abdomen.  Blood in your urine for up to 3 weeks.  A need to urinate more often than usual (bladder spasms). This may last for up to 2 weeks. After your catheter is removed, it is common to have:  A burning sensation when you urinate. This may last for up to 2 weeks after your catheter is removed.  Difficulty controlling when you urinate (incontinence). You may leak urine occasionally. Your ability to control when you urinate will improve as you continue to heal. Follow these instructions at home: Medicines  Take over-the-counter and prescription medicines only as told by your health care provider.  Do not drive or use heavy machinery while taking prescription pain medicine.  If you were prescribed an antibiotic medicine, take it as told by your health care provider. Do not stop taking the antibiotic even if you start to feel better.  If you were given a stool softener, use it as told by your health care provider. Incision care and drain  care  Follow instructions from your health care provider about how to take care of your incisions. Make sure you: ? Wash your hands with soap and water before you change your bandage (dressing). If soap and water are not available, use hand sanitizer. ? Change your dressing as told by your health care provider. ? Leave  stitches (sutures), skin glue, or adhesive strips in place. These skin closures may need to stay in place for 2 weeks or longer. If adhesive strip edges start to loosen and curl up, you may trim the loose edges. Do not remove adhesive strips completely unless your health care provider tells you to do that.  If you have a drain, follow instructions from your health care provider about how to care for it. Do not remove the drain tube or any dressings around the tube opening unless your health care provider approves.  Check your incision sites and drain area every day for signs of infection. Check for: ? More redness, swelling, or pain. ? More fluid or blood. ? Warmth. ? Pus or a bad smell. Catheter care   Always wash your hands with soap and water before and after touching your penis, your catheter, and your drainage bag. If soap and water are not available, use hand sanitizer.  Empty your drainage bag every 3 hours during the day, or as often as told by your health care provider.  Clean the tip of your penis with soap and water two times a day, or as often as told by your health care provider.  If you were given ointment to apply to the tip of your penis, follow instructions from your health care provider about how to do this.  You will need to visit your health care provider to have your catheter removed. Your catheter may remain in place for up to 3 weeks after your procedure. Activity  Return to your normal activities, including driving, as told by your health care provider. Ask your health care provider what activities are safe for you.  Do not lift anything that is heavier than 10 lb (4.5 kg) until your health care provider says that you can do this.  Avoid intense physical activity for as long as told by your health care provider.  Walk at least once a day. As you start to feel better, you may start to exercise more. Ask your health care provider what exercises are appropriate for  you. Eating and drinking  Follow instructions from your health care provider about eating or drinking restrictions.  Drink enough fluid to keep your urine clear or pale yellow. General instructions  Do not take baths, swim, or use a hot tub until your health care provider approves.  If your pelvic lymph nodes were removed for testing, it is up to you to get your test results. Ask your health care provider, or the department performing the test, when your results will be ready.  Wear compression stockings as told by your health care provider. These stockings help to prevent blood clots and reduce swelling in your legs.  Follow instructions from your health care provider about when it is safe for you to engage in sexual activity.  Do not strain to have a bowel movement.  Do not use any products that contain nicotine or tobacco, such as cigarettes and e-cigarettes. If you need help quitting, ask your health care provider.  Keep all follow-up visits as told by your health care provider. This is important. Contact a health care  provider if:  You have problems with your catheter.  You have bladder spasms more than 3 or 4 times a day.  You become constipated. Symptoms of constipation may include: ? Having fewer than three bowel movements a week. ? Difficulty having a bowel movement. ? Having stools that are hard, dry, or larger than normal. Get help right away if:  You have more redness, swelling, or pain around your incisions, drain area, or the area where the catheter was inserted (catheter insertion site).  You have more fluid or blood coming from your incision area, drain area, or catheter insertion site.  Your incision area, drain area, or catheter insertion site feels warm to the touch.  You have pus or a bad smell coming from your incision area, drain area, or catheter insertion site.  You have a fever.  You have pain that gets worse.  Your catheter falls out.  You are  unable to urinate or to pass urine through your catheter.  You have more bright red blood in your urine.  You have blood clots in your urine.  You develop: ? Chest pains. ? Shortness of breath. ? Swelling or pain in your legs. This information is not intended to replace advice given to you by your health care provider. Make sure you discuss any questions you have with your health care provider. Document Revised: 02/15/2017 Document Reviewed: 12/16/2015 Elsevier Patient Education  2020 [employer].

## 2019-04-16 NOTE — Progress Notes (Signed)
   04/16/2019 10:46 AM   [name] March 01, 1951 [id number]  Reason for visit: Follow up robotic prostatectomy  HPI: I saw Mr. Majkut back in urology clinic for follow-up after undergoing robotic prostatectomy on 04/06/2019.  He originally underwent prostate biopsy on 01/26/2019 for an elevated PSA of 5.9, and 4/12 cores were positive for 3+4 =7 disease.  He also had bothersome urinary symptoms at baseline with weak stream and feeling of incomplete emptying.  He underwent an uncomplicated robotic radical prostatectomy on 04/06/2019.  Final pathology showed Gleason score 3+4 =7 disease in 25% of the prostate with negative margins, stage pT2Nx.  A bilateral nerve sparing procedure was performed.  He has had some bladder spasms with a catheter in, but otherwise is doing well.  He is tolerating a general diet with normal bowel movements.  He is not taking narcotics any longer.  On exam, his incisions are healing well with Dermabond and he is not distended.  Foley catheter was removed and he was able to void with a strong stream.  We discussed the need for PSA surveillance moving forward.  We discussed temporary stress and urge incontinence and erectile dysfunction that may take weeks to months to improve.  Consider trial of Cialis every other day if persistent ED at follow-up.  RTC 2 months for PSA   A total of 30 minutes were spent face-to-face with the patient, greater than 50% was spent in patient education, counseling, and coordination of care regarding postop expectations after prostatectomy.  Billey Co, [facility] [address], [place], [place] [address] [phone]

## 2019-05-28 DIAGNOSIS — L578 Other skin changes due to chronic exposure to nonionizing radiation: Secondary | ICD-10-CM | POA: Diagnosis not present

## 2019-05-28 DIAGNOSIS — Z872 Personal history of diseases of the skin and subcutaneous tissue: Secondary | ICD-10-CM | POA: Diagnosis not present

## 2019-05-28 DIAGNOSIS — L570 Actinic keratosis: Secondary | ICD-10-CM | POA: Diagnosis not present

## 2019-06-15 ENCOUNTER — Other Ambulatory Visit: Payer: Medicare Other

## 2019-06-15 ENCOUNTER — Other Ambulatory Visit: Payer: Self-pay

## 2019-06-15 DIAGNOSIS — C61 Malignant neoplasm of prostate: Secondary | ICD-10-CM

## 2019-06-16 ENCOUNTER — Telehealth: Payer: Self-pay

## 2019-06-16 LAB — PSA: Prostate Specific Ag, Serum: <0.1 ng/mL (ref 0.0–4.0)

## 2019-06-16 NOTE — Telephone Encounter (Signed)
mychart sent to patient

## 2019-06-16 NOTE — Telephone Encounter (Signed)
-----   Message from Billey Co, MD sent at 06/16/2019  8:39 AM EDT ----- Judson Roch- please let him know PSA is 0 after surgery for prostate cancer, good news.  Olivia Mackie- he needs follow up for next available w/me in the next 4 weeks for follow up, Im not sure why this wasn't already scheduled.  [name], MD 06/16/2019

## 2019-07-16 ENCOUNTER — Telehealth: Payer: Self-pay | Admitting: Urology

## 2019-07-16 NOTE — Telephone Encounter (Signed)
done

## 2019-07-16 NOTE — Telephone Encounter (Signed)
-----   Message from Billey Co, MD sent at 07/16/2019 10:16 AM EDT ----- Regarding: Follow-up Please schedule follow-up in the next 4 weeks for urinalysis and PVR.  [name], MD 07/16/2019

## 2019-08-12 ENCOUNTER — Ambulatory Visit (INDEPENDENT_AMBULATORY_CARE_PROVIDER_SITE_OTHER): Payer: Medicare Other | Admitting: Urology

## 2019-08-12 ENCOUNTER — Other Ambulatory Visit: Payer: Self-pay

## 2019-08-12 ENCOUNTER — Encounter: Payer: Self-pay | Admitting: Urology

## 2019-08-12 VITALS — BP 99/67 | HR 73 | Ht 70.0 in | Wt 150.0 lb

## 2019-08-12 DIAGNOSIS — C61 Malignant neoplasm of prostate: Secondary | ICD-10-CM | POA: Diagnosis not present

## 2019-08-12 DIAGNOSIS — N529 Male erectile dysfunction, unspecified: Secondary | ICD-10-CM

## 2019-08-12 DIAGNOSIS — R3912 Poor urinary stream: Secondary | ICD-10-CM | POA: Diagnosis not present

## 2019-08-12 DIAGNOSIS — N401 Enlarged prostate with lower urinary tract symptoms: Secondary | ICD-10-CM

## 2019-08-12 LAB — URINALYSIS, COMPLETE
Bilirubin, UA: NEGATIVE
Glucose, UA: NEGATIVE
Leukocytes,UA: NEGATIVE
Nitrite, UA: NEGATIVE
Protein,UA: NEGATIVE
RBC, UA: NEGATIVE
Specific Gravity, UA: 1.02 (ref 1.005–1.030)
Urobilinogen, Ur: 0.2 mg/dL (ref 0.2–1.0)
pH, UA: 5.5 (ref 5.0–7.5)

## 2019-08-12 LAB — MICROSCOPIC EXAMINATION: Epithelial Cells (non renal): NONE SEEN /hpf (ref 0–10)

## 2019-08-12 MED ORDER — TADALAFIL 5 MG PO TABS: 5.0000 mg | ORAL_TABLET | ORAL | 6 refills | Status: DC

## 2019-08-12 NOTE — Progress Notes (Signed)
   08/12/2019 9:36 AM   Brian Glass 03-04-1951 [id number]   Reason for visit: Follow up prostate cancer  HPI: I saw [name] back in urology clinic for follow-up after undergoing robotic prostatectomy for favorable intermediate risk prostate cancer on 04/06/2019.  Final pathology showed Gleason score 3+4 =7 disease in 25% of the prostate with negative margins, stage pT2Nx.  A bilateral nerve sparing procedure was performed  His first postop PSA on 06/15/2019 was <0.1.  Overall, has been doing well since surgery.  He is having minimal leakage requiring just a pad for safety.  He seems to leak some after dinner, as well as with strenuous activity like a spinning class.  He has had a few morning erections, but not strong enough for penetration.  He has not tried any PDE 5 inhibitors yet.  His stream is stronger than before surgery, but he feels like it has decreased slightly in the last month or 2.  He has some subtle discomfort when he initiates urination but this is improving.  He denies any dysuria or pain.  He denies any significant urgency or frequency.  He denies any gross hematuria.  Urinalysis is benign today.  PVR is 0.  We again reviewed postop expectations after robotic prostatectomy for prostate cancer, as well as his favorable pathology and negative first PSA.  It can take up to a year for patients to have complete return of erectile function, and for incontinence to resolve.  Especially in his setting of severe urinary symptoms before surgery, can take the bladder a while to adjust to having the prostate removed.  RTC 3 months for PSA prior and symptom check regarding incontinence and ED Trial of Cialis 5 mg every other day for ED   Billey Co, MD  [facility] [facility] [address] [place], [place] [address] [phone]

## 2019-08-12 NOTE — Patient Instructions (Signed)
Kegel Exercises  Kegel exercises can help strengthen your pelvic floor muscles. The pelvic floor is a group of muscles that support your rectum, small intestine, and bladder. In females, pelvic floor muscles also help support the womb (uterus). These muscles help you control the flow of urine and stool. Kegel exercises are painless and simple, and they do not require any equipment. Your provider may suggest Kegel exercises to:  Improve bladder and bowel control.  Improve sexual response.  Improve weak pelvic floor muscles after surgery to remove the uterus (hysterectomy) or pregnancy (females).  Improve weak pelvic floor muscles after prostate gland removal or surgery (males). Kegel exercises involve squeezing your pelvic floor muscles, which are the same muscles you squeeze when you try to stop the flow of urine or keep from passing gas. The exercises can be done while sitting, standing, or lying down, but it is best to vary your position. Exercises How to do Kegel exercises: 1. Squeeze your pelvic floor muscles tight. You should feel a tight lift in your rectal area. If you are a male, you should also feel a tightness in your vaginal area. Keep your stomach, buttocks, and legs relaxed. 2. Hold the muscles tight for up to 10 seconds. 3. Breathe normally. 4. Relax your muscles. 5. Repeat as told by your health care provider. Repeat this exercise daily as told by your health care provider. Continue to do this exercise for at least 4-6 weeks, or for as long as told by your health care provider. You may be referred to a physical therapist who can help you learn more about how to do Kegel exercises. Depending on your condition, your health care provider may recommend:  Varying how long you squeeze your muscles.  Doing several sets of exercises every day.  Doing exercises for several weeks.  Making Kegel exercises a part of your regular exercise routine. This information is not intended  to replace advice given to you by your health care provider. Make sure you discuss any questions you have with your health care provider. Document Revised: 10/23/2017 Document Reviewed: 10/23/2017 Elsevier Patient Education  [employer].  Tadalafil tablets (Cialis) What is this medicine? TADALAFIL (tah DA la fil) is used to treat erection problems in men. It is also used for enlargement of the prostate gland in men, a condition called benign prostatic hyperplasia or BPH. This medicine improves urine flow and reduces BPH symptoms. This medicine can also treat both erection problems and BPH when they occur together. This medicine may be used for other purposes; ask your health care provider or pharmacist if you have questions. COMMON BRAND NAME(S): Kathaleen Bury, Cialis What should I tell my health care provider before I take this medicine? They need to know if you have any of these conditions:  bleeding disorders  eye or vision problems, including a rare inherited eye disease called retinitis pigmentosa  anatomical deformation of the penis, Peyronie's disease, or history of priapism (painful and prolonged erection)  heart disease, angina, a history of heart attack, irregular heart beats, or other heart problems  high or low blood pressure  history of blood diseases, like sickle cell anemia or leukemia  history of stomach bleeding  kidney disease  liver disease  stroke  an unusual or allergic reaction to tadalafil, other medicines, foods, dyes, or preservatives  pregnant or trying to get pregnant  breast-feeding How should I use this medicine? Take this medicine by mouth with a glass of water. Follow the directions  on the prescription label. You may take this medicine with or without meals. When this medicine is used for erection problems, your doctor may prescribe it to be taken once daily or as needed. If you are taking the medicine as needed, you may be able to have  sexual activity 30 minutes after taking it and for up to 36 hours after taking it. Whether you are taking the medicine as needed or once daily, you should not take more than one dose per day. If you are taking this medicine for symptoms of benign prostatic hyperplasia (BPH) or to treat both BPH and an erection problem, take the dose once daily at about the same time each day. Do not take your medicine more often than directed. Talk to your pediatrician regarding the use of this medicine in children. Special care may be needed. Overdosage: If you think you have taken too much of this medicine contact a poison control center or emergency room at once. NOTE: This medicine is only for you. Do not share this medicine with others. What if I miss a dose? If you are taking this medicine as needed for erection problems, this does not apply. If you miss a dose while taking this medicine once daily for an erection problem, benign prostatic hyperplasia, or both, take it as soon as you remember, but do not take more than one dose per day. What may interact with this medicine? Do not take this medicine with any of the following medications:  nitrates like amyl nitrite, isosorbide dinitrate, isosorbide mononitrate, nitroglycerin  other medicines for erectile dysfunction like avanafil, sildenafil, vardenafil  other tadalafil products ([employer]circa)  riociguat This medicine may also interact with the following medications:  certain drugs for high blood pressure  certain drugs for the treatment of HIV infection or AIDS  certain drugs used for fungal or yeast infections, like fluconazole, itraconazole, ketoconazole, and voriconazole  certain drugs used for seizures like carbamazepine, phenytoin, and phenobarbital  grapefruit juice  macrolide antibiotics like clarithromycin, erythromycin, troleandomycin  medicines for prostate problems  rifabutin, rifampin or rifapentine This list may not describe all  possible interactions. Give your health care provider a list of all the medicines, herbs, non-prescription drugs, or dietary supplements you use. Also tell them if you smoke, drink alcohol, or use illegal drugs. Some items may interact with your medicine. What should I watch for while using this medicine? If you notice any changes in your vision while taking this drug, call your doctor or health care professional as soon as possible. Stop using this medicine and call your health care provider right away if you have a loss of sight in one or both eyes. Contact your doctor or health care professional right away if the erection lasts longer than 4 hours or if it becomes painful. This may be a sign of serious problem and must be treated right away to prevent permanent damage. If you experience symptoms of nausea, dizziness, chest pain or arm pain upon initiation of sexual activity after taking this medicine, you should refrain from further activity and call your doctor or health care professional as soon as possible. Do not drink alcohol to excess (examples, 5 glasses of wine or 5 shots of whiskey) when taking this medicine. When taken in excess, alcohol can increase your chances of getting a headache or getting dizzy, increasing your heart rate or lowering your blood pressure. Using this medicine does not protect you or your partner against HIV infection (the virus that  causes AIDS) or other sexually transmitted diseases. What side effects may I notice from receiving this medicine? Side effects that you should report to your doctor or health care professional as soon as possible:  allergic reactions like skin rash, itching or hives, swelling of the face, lips, or tongue  breathing problems  changes in hearing  changes in vision  chest pain  fast, irregular heartbeat  prolonged or painful erection  seizures Side effects that usually do not require medical attention (report to your doctor or  health care professional if they continue or are bothersome):  back pain  dizziness  flushing  headache  indigestion  muscle aches  nausea  stuffy or runny nose This list may not describe all possible side effects. Call your doctor for medical advice about side effects. You may report side effects to FDA at 1-800-FDA-1088. Where should I keep my medicine? Keep out of the reach of children. Store at room temperature between 15 and 30 degrees C (59 and 86 degrees F). Throw away any unused medicine after the expiration date. NOTE: This sheet is a summary. It may not cover all possible information. If you have questions about this medicine, talk to your doctor, pharmacist, or health care provider.  2020 Elsevier/Gold Standard (2013-07-24 13:15:49)

## 2019-09-23 ENCOUNTER — Ambulatory Visit: Payer: Medicare Other | Admitting: Urology

## 2019-10-05 ENCOUNTER — Other Ambulatory Visit: Payer: Self-pay

## 2019-10-05 DIAGNOSIS — R3912 Poor urinary stream: Secondary | ICD-10-CM

## 2019-10-05 DIAGNOSIS — N401 Enlarged prostate with lower urinary tract symptoms: Secondary | ICD-10-CM

## 2019-10-05 MED ORDER — TADALAFIL 5 MG PO TABS: 5.0000 mg | ORAL_TABLET | ORAL | 6 refills | Status: DC

## 2019-10-05 NOTE — Telephone Encounter (Signed)
[facility]. RX cancelled at [employer].

## 2019-10-07 ENCOUNTER — Other Ambulatory Visit: Payer: Self-pay | Admitting: Primary Care

## 2019-10-07 DIAGNOSIS — C61 Malignant neoplasm of prostate: Secondary | ICD-10-CM

## 2019-10-07 DIAGNOSIS — E785 Hyperlipidemia, unspecified: Secondary | ICD-10-CM

## 2019-10-09 ENCOUNTER — Telehealth: Payer: Self-pay

## 2019-10-09 NOTE — Telephone Encounter (Signed)
[facility]coming [facility] approving Tadalafil 10/08/19- 10/07/20.

## 2019-10-22 ENCOUNTER — Ambulatory Visit (INDEPENDENT_AMBULATORY_CARE_PROVIDER_SITE_OTHER): Payer: Medicare Other | Admitting: Dermatology

## 2019-10-22 ENCOUNTER — Other Ambulatory Visit: Payer: Self-pay

## 2019-10-22 DIAGNOSIS — D2372 Other benign neoplasm of skin of left lower limb, including hip: Secondary | ICD-10-CM | POA: Diagnosis not present

## 2019-10-22 DIAGNOSIS — D229 Melanocytic nevi, unspecified: Secondary | ICD-10-CM | POA: Diagnosis not present

## 2019-10-22 DIAGNOSIS — D2371 Other benign neoplasm of skin of right lower limb, including hip: Secondary | ICD-10-CM | POA: Diagnosis not present

## 2019-10-22 DIAGNOSIS — D1800 Hemangioma unspecified site: Secondary | ICD-10-CM

## 2019-10-22 DIAGNOSIS — D239 Other benign neoplasm of skin, unspecified: Secondary | ICD-10-CM

## 2019-10-22 DIAGNOSIS — L570 Actinic keratosis: Secondary | ICD-10-CM | POA: Diagnosis not present

## 2019-10-22 DIAGNOSIS — L821 Other seborrheic keratosis: Secondary | ICD-10-CM | POA: Diagnosis not present

## 2019-10-22 DIAGNOSIS — Z85828 Personal history of other malignant neoplasm of skin: Secondary | ICD-10-CM

## 2019-10-22 DIAGNOSIS — Z1283 Encounter for screening for malignant neoplasm of skin: Secondary | ICD-10-CM

## 2019-10-22 DIAGNOSIS — L814 Other melanin hyperpigmentation: Secondary | ICD-10-CM | POA: Diagnosis not present

## 2019-10-22 DIAGNOSIS — L578 Other skin changes due to chronic exposure to nonionizing radiation: Secondary | ICD-10-CM

## 2019-10-22 NOTE — Progress Notes (Signed)
° °  Follow-Up Visit   Subjective  Brian Glass is a 69 y.o. male who presents for the following: Annual Exam (history of BCC at left malar cheek 2018).  Patient here today for full body skin exam and skin cancer screening. He does have a history of BCC. Patient brought a list of a few spots he would like checked, crusty spot on the left knee, one at right forehead, right low back.  The following portions of the chart were reviewed this encounter and updated as appropriate:  Tobacco   Allergies   Meds   Problems   Med Hx   Surg Hx   Fam Hx       Review of Systems:  No other skin or systemic complaints except as noted in HPI or Assessment and Plan.  Objective  Well appearing patient in no apparent distress; mood and affect are within normal limits.  A full examination was performed including scalp, head, eyes, ears, nose, lips, neck, chest, axillae, abdomen, back, buttocks, bilateral upper extremities, bilateral lower extremities, hands, feet, fingers, toes, fingernails, and toenails. All findings within normal limits unless otherwise noted below.  Objective  L medial thigh x 2, R lat knee: Firm pink/brown papule nodule with dimple sign.   Objective  Right Upper Back: Erythematous thin papules/macules with gritty scale.    Assessment & Plan  Dermatofibroma L medial thigh x 2, R lat knee  Benign, observe.    AK (actinic keratosis) Right Upper Back  Destruction of lesion - Right Upper Back Complexity: simple   Destruction method: cryotherapy   Informed consent: discussed and consent obtained   Lesion destroyed using liquid nitrogen: Yes   Cryotherapy cycles:  2 Outcome: patient tolerated procedure well with no complications   Post-procedure details: wound care instructions given     Lentigines - Scattered tan macules - Discussed due to sun exposure - Benign, observe - Call for any changes  Seborrheic Keratoses - Stuck-on, waxy, tan-brown papules and plaques  -  Discussed benign etiology and prognosis. - Observe - Call for any changes  Melanocytic Nevi - Tan-brown and/or pink-flesh-colored symmetric macules and papules - Benign appearing on exam today - Observation - Call clinic for new or changing moles - Recommend daily use of broad spectrum spf 30+ sunscreen to sun-exposed areas.   Hemangiomas - Red papules - Discussed benign nature - Observe - Call for any changes  Actinic Damage - diffuse scaly erythematous macules with underlying dyspigmentation - Recommend daily broad spectrum sunscreen SPF 30+ to sun-exposed areas, reapply every 2 hours as needed.  - Call for new or changing lesions.  Skin cancer screening performed today.  History of Basal Cell Carcinoma of the Skin - No evidence of recurrence today at left malar cheek - Recommend regular full body skin exams - Recommend daily broad spectrum sunscreen SPF 30+ to sun-exposed areas, reapply every 2 hours as needed.  - Call if any new or changing lesions are noted between office visits  Return in about 6 months (around 04/23/2020) for TBSE.  Graciella Belton, RMA, am acting as scribe for Forest Gleason, MD .  Documentation: I have reviewed the above documentation for accuracy and completeness, and I agree with the above.  Forest Gleason, MD

## 2019-10-22 NOTE — Patient Instructions (Addendum)
Recommend daily broad spectrum sunscreen SPF 30+ to sun-exposed areas, reapply every 2 hours as needed. Call for new or changing lesions.  Cryotherapy Aftercare  . Wash gently with soap and water everyday.   Marland Kitchen Apply Vaseline and Band-Aid daily until healed.  Seborrheic Keratosis  What causes seborrheic keratoses? Seborrheic keratoses are harmless, common skin growths that first appear during adult life.  As time goes by, more growths appear.  Some people may develop a large number of them.  Seborrheic keratoses appear on both covered and uncovered body parts.  They are not caused by sunlight.  The tendency to develop seborrheic keratoses can be inherited.  They vary in color from skin-colored to gray, brown, or even black.  They can be either smooth or have a rough, warty surface.   Seborrheic keratoses are superficial and look as if they were stuck on the skin.  Under the microscope this type of keratosis looks like layers upon layers of skin.  That is why at times the top layer may seem to fall off, but the rest of the growth remains and re-grows.    Treatment Seborrheic keratoses do not need to be treated, but can easily be removed in the office.  Seborrheic keratoses often cause symptoms when they rub on clothing or jewelry.  Lesions can be in the way of shaving.  If they become inflamed, they can cause itching, soreness, or burning.  Removal of a seborrheic keratosis can be accomplished by freezing, burning, or surgery. If any spot bleeds, scabs, or grows rapidly, please return to have it checked, as these can be an indication of a skin cancer.

## 2019-11-03 ENCOUNTER — Other Ambulatory Visit: Payer: Self-pay

## 2019-11-03 ENCOUNTER — Other Ambulatory Visit (INDEPENDENT_AMBULATORY_CARE_PROVIDER_SITE_OTHER): Payer: Medicare Other

## 2019-11-03 DIAGNOSIS — E785 Hyperlipidemia, unspecified: Secondary | ICD-10-CM

## 2019-11-03 DIAGNOSIS — C61 Malignant neoplasm of prostate: Secondary | ICD-10-CM

## 2019-11-03 LAB — COMPREHENSIVE METABOLIC PANEL WITH GFR
ALT: 22 U/L (ref 0–53)
AST: 24 U/L (ref 0–37)
Albumin: 4.2 g/dL (ref 3.5–5.2)
Alkaline Phosphatase: 83 U/L (ref 39–117)
BUN: 28 mg/dL — ABNORMAL HIGH (ref 6–23)
CO2: 29 meq/L (ref 19–32)
Calcium: 9 mg/dL (ref 8.4–10.5)
Chloride: 106 meq/L (ref 96–112)
Creatinine, Ser: 1.13 mg/dL (ref 0.40–1.50)
GFR: 64.37 mL/min
Glucose, Bld: 91 mg/dL (ref 70–99)
Potassium: 4.3 meq/L (ref 3.5–5.1)
Sodium: 140 meq/L (ref 135–145)
Total Bilirubin: 1.2 mg/dL (ref 0.2–1.2)
Total Protein: 6.2 g/dL (ref 6.0–8.3)

## 2019-11-03 LAB — CBC
HCT: 40.1 % (ref 39.0–52.0)
Hemoglobin: 14.1 g/dL (ref 13.0–17.0)
MCHC: 35.2 g/dL (ref 30.0–36.0)
MCV: 88.9 fl (ref 78.0–100.0)
Platelets: 127 10*3/uL — ABNORMAL LOW (ref 150.0–400.0)
RBC: 4.51 Mil/uL (ref 4.22–5.81)
RDW: 13.3 % (ref 11.5–15.5)
WBC: 4.9 10*3/uL (ref 4.0–10.5)

## 2019-11-03 LAB — LIPID PANEL
Cholesterol: 166 mg/dL (ref 0–200)
HDL: 55.8 mg/dL
LDL Cholesterol: 101 mg/dL — ABNORMAL HIGH (ref 0–99)
NonHDL: 110.14
Total CHOL/HDL Ratio: 3
Triglycerides: 44 mg/dL (ref 0.0–149.0)
VLDL: 8.8 mg/dL (ref 0.0–40.0)

## 2019-11-03 LAB — PSA: PSA: 0 ng/mL — ABNORMAL LOW (ref 0.10–4.00)

## 2019-11-08 ENCOUNTER — Encounter: Payer: Self-pay | Admitting: Dermatology

## 2019-11-10 ENCOUNTER — Other Ambulatory Visit: Payer: Self-pay

## 2019-11-10 ENCOUNTER — Encounter: Payer: Self-pay | Admitting: Primary Care

## 2019-11-10 ENCOUNTER — Ambulatory Visit (INDEPENDENT_AMBULATORY_CARE_PROVIDER_SITE_OTHER): Payer: Medicare Other | Admitting: Primary Care

## 2019-11-10 VITALS — BP 116/74 | HR 61 | Temp 96.1°F | Ht 70.0 in | Wt 167.2 lb

## 2019-11-10 DIAGNOSIS — N401 Enlarged prostate with lower urinary tract symptoms: Secondary | ICD-10-CM

## 2019-11-10 DIAGNOSIS — E785 Hyperlipidemia, unspecified: Secondary | ICD-10-CM | POA: Diagnosis not present

## 2019-11-10 DIAGNOSIS — C61 Malignant neoplasm of prostate: Secondary | ICD-10-CM

## 2019-11-10 DIAGNOSIS — Z0000 Encounter for general adult medical examination without abnormal findings: Secondary | ICD-10-CM

## 2019-11-10 DIAGNOSIS — D696 Thrombocytopenia, unspecified: Secondary | ICD-10-CM

## 2019-11-10 DIAGNOSIS — I480 Paroxysmal atrial fibrillation: Secondary | ICD-10-CM

## 2019-11-10 NOTE — Assessment & Plan Note (Signed)
Immunizations UTD. Colonoscopy UTD, due in 2027. PSA UTD, follows with Urology since prostatectomy. Commended him on a healthy diet, regular exercise. Exam today stable. Labs reviewed.   I have personally reviewed and have noted: 1. The patient's medical and social history 2. Their use of alcohol, tobacco or illicit drugs 3. Their current medications and supplements 4. The patient's functional ability including ADL's, fall risks, home  safety risks and hearing or visual impairment. 5. Diet and physical activities 6. Evidence for depression or mood disorder

## 2019-11-10 NOTE — Assessment & Plan Note (Signed)
Rate and rhythm regular. Continue to monitor.  

## 2019-11-10 NOTE — Progress Notes (Signed)
Patient ID: Brian Glass, male   DOB: June 12, 1950, 69 y.o.   MRN: [id number]  Brian Glass is a 69 year old male who presents today for [place].  HPI:  Past Medical History:  Diagnosis Date  . Allergy   . Arthritis   . Atrial fibrillation ([place])   . Bilateral vitreous detachment   . Chickenpox   . Hx of basal cell carcinoma 2018   L malar cheek  . Prostate cancer ([place]) 2020  . Typical atrial flutter ([facility])     Current Outpatient Medications  Medication Sig Dispense Refill  . B Complex Vitamins (VITAMIN B COMPLEX PO) Take 1 tablet by mouth daily.    . Calcium Polycarbophil (EQ FIBER LAXATIVE PO) Take 1 capsule by mouth daily.     . fluticasone (FLONASE) 50 MCG/ACT nasal spray Place 1 spray into both nostrils daily as needed for allergies.     . Multiple Vitamins-Minerals (SENIOR MULTIVITAMIN PLUS PO) Take 1 tablet by mouth daily.    Marland Kitchen olopatadine (PATANOL) 0.1 % ophthalmic solution Place 1 drop into both eyes 2 (two) times daily as needed for allergies.     . Omega-3 Fatty Acids (FISH OIL PO) Take 1,300 mg by mouth daily.    . tadalafil (CIALIS) 5 MG tablet Take 1 tablet (5 mg total) by mouth every other day. 30 tablet 6   No current facility-administered medications for this visit.    Allergies  Allergen Reactions  . Sudafed [Pseudoephedrine Hcl]     Hard to urinate     Family History  Problem Relation Age of Onset  . Hyperlipidemia Mother   . Heart disease Mother   . Hypertension Mother   . Hyperlipidemia Father   . Heart disease Father   . Stroke Father   . Hypertension Father   . Heart attack Father   . Heart disease Brother   . Prostate cancer Brother   . Atrial fibrillation Brother   . Sleep apnea Brother     Social History   Socioeconomic History  . Marital status: Married    Spouse name: Dorian Pod  . Number of children: Not on file  . Years of education: Not on file  . Highest education level: Not on file  Occupational History  . Not on file  Tobacco Use  .  Smoking status: Never Smoker  . Smokeless tobacco: Former Systems developer    Types: Secondary school teacher  . Vaping Use: Never used  Substance and Sexual Activity  . Alcohol use: Yes    Alcohol/week: 2.0 standard drinks    Types: 2 Cans of beer per week    Comment: social  . Drug use: No  . Sexual activity: Yes    Birth control/protection: None  Other Topics Concern  . Not on file  Social History Narrative   Married.   3 children. 2 grandchildren.   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   Social Determinants of Health   Financial Resource Strain:   . Difficulty of Paying Living Expenses: Not on file  Food Insecurity:   . Worried About Charity fundraiser in the Last Year: Not on file  . Ran Out of Food in the Last Year: Not on file  Transportation Needs:   . Lack of Transportation (Medical): Not on file  . Lack of Transportation (Non-Medical): Not on file  Physical Activity:   . Days of Exercise per Week: Not on file  . Minutes of Exercise  per Session: Not on file  Stress:   . Feeling of Stress : Not on file  Social Connections:   . Frequency of Communication with Friends and Family: Not on file  . Frequency of Social Gatherings with Friends and Family: Not on file  . Attends Religious Services: Not on file  . Active Member of Clubs or Organizations: Not on file  . Attends Archivist Meetings: Not on file  . Marital Status: Not on file  Intimate Partner Violence:   . Fear of Current or Ex-Partner: Not on file  . Emotionally Abused: Not on file  . Physically Abused: Not on file  . Sexually Abused: Not on file    Hospitiliaztions: January 2021  Health Maintenance:    Flu: Due this season   Tetanus: Completed in 2014  Pneumovax: Completed in 2013  Prevnar: Completed in 2018  Shingrix: Completed series  Colonoscopy: Completed in 2020, due in 2027  Eye Doctor: Due, he will schedule   Dental Exam: Completes semi-annually   PSA:  Undetectable  Hep C Screening: Negative    Providers: Alma Friendly, PCP; Alfonso Patten, dermatology; Dr. Diamantina Providence, Urology   I have personally reviewed and have noted: 1. The patient's medical and social history 2. Their use of alcohol, tobacco or illicit drugs 3. Their current medications and supplements 4. The patient's functional ability including ADL's, fall risks, home  safety risks and hearing or visual impairment. 5. Diet and physical activities 6. Evidence for depression or mood disorder  Subjective:   Review of Systems:   Constitutional: Denies fever, malaise, fatigue, headache or abrupt weight changes.  HEENT: Denies eye pain, eye redness, ear pain, ringing in the ears, wax buildup, runny nose, nasal congestion, bloody nose, or sore throat. Respiratory: Denies difficulty breathing, shortness of breath, cough or sputum production.   Cardiovascular: Denies chest pain, chest tightness, palpitations or swelling in the hands or feet.  Gastrointestinal: Denies abdominal pain, bloating, constipation, diarrhea or blood in the stool.  GU: Denies urgency, pain with urination, burning sensation, blood in urine, odor or discharge. Musculoskeletal: Two month history of left anterior patellar knee pain with pressure and turning his leg. No pain over the last 2 weeks. Skin: Denies redness.  Neurological: Denies dizziness, difficulty with memory, difficulty with speech or problems with balance and coordination.  Psychiatric: Denies concerns for anxiety or depression.   No other specific complaints in a complete review of systems (except as listed in HPI above).  Objective:  PE:   BP 116/74   Pulse 61   Temp (!) 96.1 F (35.6 C) (Temporal)   Ht 5\' 10"  (1.778 m)   Wt 167 lb 4 oz (75.9 kg)   SpO2 98%   BMI 24.00 kg/m  Wt Readings from Last 3 Encounters:  11/10/19 167 lb 4 oz (75.9 kg)  08/12/19 150 lb (68 kg)  04/16/19 165 lb (74.8 kg)    General: Appears their stated age,  well developed, well nourished in NAD. Skin: Warm, dry and intact. No rashes, lesions or ulcerations noted. HEENT: Head: normal shape and size; Eyes: sclera white, no icterus, conjunctiva pink, PERRLA and EOMs intact; Ears: Tm's gray and intact, normal light reflex; Nose: mucosa pink and moist, septum midline; Throat/Mouth: Teeth present, mucosa pink and moist, no exudate, lesions or ulcerations noted.  Neck: Normal range of motion. Neck supple, trachea midline. No massses, lumps or thyromegaly present.  Cardiovascular: Normal rate and rhythm. S1,S2 noted.  No murmur, rubs or gallops noted.  No JVD or BLE edema. No carotid bruits noted. Pulmonary/Chest: Normal effort and positive vesicular breath sounds. No respiratory distress. No wheezes, rales or ronchi noted.  Abdomen: Soft and nontender. Normal bowel sounds, no bruits noted. No distention or masses noted. Liver, spleen and kidneys non palpable. Musculoskeletal: Normal range of motion. No signs of joint swelling. No difficulty with gait.  Neurological: Alert and oriented. Cranial nerves II-XII intact. Coordination normal. +DTRs bilaterally. Psychiatric: Mood and affect normal. Behavior is normal. Judgment and thought content normal.   BMET    Component Value Date/Time   NA 140 11/03/2019 0802   NA 143 04/24/2016 0826   K 4.3 11/03/2019 0802   CL 106 11/03/2019 0802   CO2 29 11/03/2019 0802   GLUCOSE 91 11/03/2019 0802   BUN 28 (H) 11/03/2019 0802   BUN 20 04/24/2016 0826   CREATININE 1.13 11/03/2019 0802   CALCIUM 9.0 11/03/2019 0802   GFRNONAA >60 04/07/2019 0415   GFRAA >60 04/07/2019 0415    Lipid Panel     Component Value Date/Time   CHOL 166 11/03/2019 0802   TRIG 44.0 11/03/2019 0802   HDL 55.80 11/03/2019 0802   CHOLHDL 3 11/03/2019 0802   VLDL 8.8 11/03/2019 0802   LDLCALC 101 (H) 11/03/2019 0802    CBC    Component Value Date/Time   WBC 4.9 11/03/2019 0802   RBC 4.51 11/03/2019 0802   HGB 14.1 11/03/2019 0802    HGB 13.8 04/24/2016 0826   HCT 40.1 11/03/2019 0802   HCT 38.2 04/24/2016 0826   PLT 127.0 (L) 11/03/2019 0802   PLT 128 (L) 04/24/2016 0826   MCV 88.9 11/03/2019 0802   MCV 86 04/24/2016 0826   MCH 30.4 04/07/2019 0415   MCHC 35.2 11/03/2019 0802   RDW 13.3 11/03/2019 0802   RDW 13.1 04/24/2016 0826   LYMPHSABS 1.4 04/24/2016 0826   EOSABS 0.1 04/24/2016 0826   BASOSABS 0.0 04/24/2016 0826    Hgb A1C No results found for: HGBA1C    Assessment and Plan:   Medicare Annual Wellness Visit:  Diet: He endorses a healthy diet Physical activity: Exercises regularly  Depression/mood screen: Negative Hearing: Intact to whispered voice Visual acuity: Grossly normal, performs annual eye exam  ADLs: Capable Fall risk: None Home safety: Good Cognitive evaluation: Intact to orientation, naming, recall and repetition EOL planning: Adv directives, full code/ I agree  Preventative Medicine: Immunizations UTD. Colonoscopy UTD, due in 2027. PSA UTD, follows with Urology since prostatectomy. Commended him on a healthy diet, regular exercise. Exam today stable. Labs reviewed.   Next appointment: One year.  The 10-year ASCVD risk score Mikey Bussing DC Brooke Bonito., et al., 2013) is: 11.4%   Values used to calculate the score:     Age: 89 years     Sex: Male     Is Non-Hispanic African American: No     Diabetic: No     Tobacco smoker: No     Systolic Blood Pressure: 867 mmHg     Is BP treated: No     HDL Cholesterol: 55.8 mg/dL     Total Cholesterol: 166 mg/dL

## 2019-11-10 NOTE — Assessment & Plan Note (Signed)
LDL of 101 which overall seems stable, but given family history he is at a higher risk for CAD.  ASCVD risk score of 11.4%. Discussed to consider statin therapy for protection, he will think about this and update.

## 2019-11-10 NOTE — Patient Instructions (Signed)
Continue exercising. You should be getting 150 minutes of moderate intensity exercise weekly.  Continue to work on a healthy diet.  Consider starting a cholesterol lowering medication given your family history of heart disease.   It was a pleasure to see you today!

## 2019-11-10 NOTE — Assessment & Plan Note (Signed)
Following with Urology, recent prostatectomy.  Symptoms post prostatectomy improved.

## 2019-11-10 NOTE — Assessment & Plan Note (Signed)
Appears to be chronic after chart review, overall stable. Need to continue to monitor given history of prostate cancer.

## 2019-11-10 NOTE — Assessment & Plan Note (Signed)
S/P prostatectomy in January 2021. Follows with Urology. Recent PSA undetectable.

## 2019-11-11 MED ORDER — ROSUVASTATIN CALCIUM 5 MG PO TABS: 5.0000 mg | ORAL_TABLET | Freq: Every evening | ORAL | 3 refills | Status: DC

## 2019-11-13 ENCOUNTER — Other Ambulatory Visit: Payer: Medicare Other

## 2019-11-18 ENCOUNTER — Other Ambulatory Visit: Payer: Self-pay

## 2019-11-18 ENCOUNTER — Ambulatory Visit (INDEPENDENT_AMBULATORY_CARE_PROVIDER_SITE_OTHER): Payer: Medicare Other | Admitting: Urology

## 2019-11-18 ENCOUNTER — Telehealth: Payer: Self-pay

## 2019-11-18 ENCOUNTER — Encounter: Payer: Self-pay | Admitting: Urology

## 2019-11-18 VITALS — BP 110/72 | HR 71 | Ht 70.0 in | Wt 167.0 lb

## 2019-11-18 DIAGNOSIS — N529 Male erectile dysfunction, unspecified: Secondary | ICD-10-CM

## 2019-11-18 DIAGNOSIS — C61 Malignant neoplasm of prostate: Secondary | ICD-10-CM

## 2019-11-18 DIAGNOSIS — N401 Enlarged prostate with lower urinary tract symptoms: Secondary | ICD-10-CM

## 2019-11-18 DIAGNOSIS — R3912 Poor urinary stream: Secondary | ICD-10-CM

## 2019-11-18 MED ORDER — TADALAFIL 10 MG PO TABS: 10.0000 mg | ORAL_TABLET | ORAL | 5 refills | Status: DC

## 2019-11-18 NOTE — Telephone Encounter (Signed)
OK to take 2 tablets every other day until out of 5mg  pills, please change his prescription to 10mg  every other day, encourage goodrx, thanks  [name], MD 11/18/2019

## 2019-11-18 NOTE — Patient Instructions (Signed)
Ok to take cialis 10mg  every other day

## 2019-11-18 NOTE — Telephone Encounter (Signed)
New RX sent. Pt unable to use goodrx with mail order.     OK to take 2 tablets every other day until out of 5mg  pills, please change his prescription to 10mg  every other day, encourage goodrx, thanks  [name], MD 11/18/2019       Documentation   You  Billey Co, MD 1 hour ago (3:04 PM)   I have the rx showing pt taking one tablet every other day should it be 2 tablets every other day? If so I can have rx updated and sent in.   -OG   Message text   Brian Glass, Brian "Joe"  Billey Co, MD 4 hours ago (11:44 AM)   [facility] is asking for a new prescription for Cialis.  If I take 2 tablets every other day I run out before the auto refill, and to complicate things they/[facility]  shipped me 30 tablets to last 90 days on the original prescription instead of 45 tablets.

## 2019-11-18 NOTE — Progress Notes (Signed)
   11/18/2019 11:34 AM   Brian Glass 03/28/1950 [id number]  Reason for visit: Follow up prostate cancer, erectile dysfunction  HPI: I saw [name] back in urology clinic for follow-up after undergoing robotic prostatectomy for favorable intermediate risk prostate cancer on 04/06/2019. Final pathology showed Gleason score 3+4 =7 disease in 25% of the prostate with negative margins,stage pT2Nx.A bilateral nerve sparing procedure was performed  His first postop PSA on 06/15/2019 was <0.1.  PSA remains 0 on 11/03/2019.  Overall, has been doing well since surgery.  His incontinence has almost completely resolved, and he is no longer needing a pad.  He will have leakage maybe once a week with activity.  At our last visit we started 5 mg Cialis every other day for erections, but he still has not had erections strong enough for penetration.  His strength of urine stream has improved significantly from prior to surgery and is happy with his urinary symptoms currently.   We again reviewed postop expectations after robotic prostatectomy for prostate cancer, as well as his favorable pathology and undetectable PSA.  It can take up to a year for patients to have complete return of erectile function, and for incontinence to resolve.  RTC 6 months for PSA prior and symptom check regarding ED Cialis increased to 10 mg every other day  Billey Co, MD  [facility] [address] [place], [place] [address] [phone]

## 2019-11-25 ENCOUNTER — Telehealth: Payer: Self-pay

## 2019-11-25 NOTE — Telephone Encounter (Signed)
Incoming PA approval from [facility] approving Tadalafil, approval good until 11/19/2020.

## 2019-12-17 DIAGNOSIS — Z23 Encounter for immunization: Secondary | ICD-10-CM | POA: Diagnosis not present

## 2020-01-14 ENCOUNTER — Other Ambulatory Visit: Payer: Self-pay | Admitting: Primary Care

## 2020-01-14 DIAGNOSIS — E785 Hyperlipidemia, unspecified: Secondary | ICD-10-CM

## 2020-01-20 ENCOUNTER — Other Ambulatory Visit: Payer: Self-pay

## 2020-01-20 ENCOUNTER — Other Ambulatory Visit (INDEPENDENT_AMBULATORY_CARE_PROVIDER_SITE_OTHER): Payer: Medicare Other

## 2020-01-20 DIAGNOSIS — E785 Hyperlipidemia, unspecified: Secondary | ICD-10-CM

## 2020-01-20 LAB — HEPATIC FUNCTION PANEL
ALT: 26 U/L (ref 0–53)
AST: 25 U/L (ref 0–37)
Albumin: 3.9 g/dL (ref 3.5–5.2)
Alkaline Phosphatase: 77 U/L (ref 39–117)
Bilirubin, Direct: 0.2 mg/dL (ref 0.0–0.3)
Total Bilirubin: 1 mg/dL (ref 0.2–1.2)
Total Protein: 6.2 g/dL (ref 6.0–8.3)

## 2020-01-20 LAB — LIPID PANEL
Cholesterol: 112 mg/dL (ref 0–200)
HDL: 48.3 mg/dL (ref >39.00)
LDL Cholesterol: 52 mg/dL (ref 0–99)
NonHDL: 63.22
Total CHOL/HDL Ratio: 2
Triglycerides: 58 mg/dL (ref 0.0–149.0)
VLDL: 11.6 mg/dL (ref 0.0–40.0)

## 2020-01-25 ENCOUNTER — Other Ambulatory Visit: Payer: Self-pay

## 2020-01-25 DIAGNOSIS — N401 Enlarged prostate with lower urinary tract symptoms: Secondary | ICD-10-CM

## 2020-01-25 DIAGNOSIS — R3912 Poor urinary stream: Secondary | ICD-10-CM

## 2020-01-25 MED ORDER — TADALAFIL 10 MG PO TABS: 10.0000 mg | ORAL_TABLET | ORAL | 1 refills | Status: DC

## 2020-01-25 NOTE — Telephone Encounter (Signed)
See mychart documentation

## 2020-05-02 NOTE — Telephone Encounter (Signed)
Do they need to see you first or make app with sports med?

## 2020-05-05 ENCOUNTER — Ambulatory Visit (INDEPENDENT_AMBULATORY_CARE_PROVIDER_SITE_OTHER): Payer: Medicare Other | Admitting: Dermatology

## 2020-05-05 ENCOUNTER — Other Ambulatory Visit: Payer: Self-pay

## 2020-05-05 DIAGNOSIS — D2372 Other benign neoplasm of skin of left lower limb, including hip: Secondary | ICD-10-CM | POA: Diagnosis not present

## 2020-05-05 DIAGNOSIS — D229 Melanocytic nevi, unspecified: Secondary | ICD-10-CM

## 2020-05-05 DIAGNOSIS — L738 Other specified follicular disorders: Secondary | ICD-10-CM | POA: Diagnosis not present

## 2020-05-05 DIAGNOSIS — D1800 Hemangioma unspecified site: Secondary | ICD-10-CM

## 2020-05-05 DIAGNOSIS — L219 Seborrheic dermatitis, unspecified: Secondary | ICD-10-CM

## 2020-05-05 DIAGNOSIS — L578 Other skin changes due to chronic exposure to nonionizing radiation: Secondary | ICD-10-CM

## 2020-05-05 DIAGNOSIS — L821 Other seborrheic keratosis: Secondary | ICD-10-CM

## 2020-05-05 DIAGNOSIS — Z1283 Encounter for screening for malignant neoplasm of skin: Secondary | ICD-10-CM | POA: Diagnosis not present

## 2020-05-05 DIAGNOSIS — L720 Epidermal cyst: Secondary | ICD-10-CM

## 2020-05-05 DIAGNOSIS — Z85828 Personal history of other malignant neoplasm of skin: Secondary | ICD-10-CM | POA: Diagnosis not present

## 2020-05-05 DIAGNOSIS — L814 Other melanin hyperpigmentation: Secondary | ICD-10-CM

## 2020-05-05 DIAGNOSIS — D239 Other benign neoplasm of skin, unspecified: Secondary | ICD-10-CM

## 2020-05-05 NOTE — Patient Instructions (Addendum)
Melanoma ABCDEs  Melanoma is the most dangerous type of skin cancer, and is the leading cause of death from skin disease.  You are more likely to develop melanoma if you:  Have light-colored skin, light-colored eyes, or red or blond hair  Spend a lot of time in the sun  Tan regularly, either outdoors or in a tanning bed  Have had blistering sunburns, especially during childhood  Have a close family member who has had a melanoma  Have atypical moles or large birthmarks  Early detection of melanoma is key since treatment is typically straightforward and cure rates are extremely high if we catch it early.   The first sign of melanoma is often a change in a mole or a new dark spot.  The ABCDE system is a way of remembering the signs of melanoma.  A for asymmetry:  The two halves do not match. B for border:  The edges of the growth are irregular. C for color:  A mixture of colors are present instead of an even brown color. D for diameter:  Melanomas are usually (but not always) greater than 17mm - the size of a pencil eraser. E for evolution:  The spot keeps changing in size, shape, and color.  Please check your skin once per month between visits. You can use a small mirror in front and a large mirror behind you to keep an eye on the back side or your body.   If you see any new or changing lesions before your next follow-up, please call to schedule a visit.  Please continue daily skin protection including broad spectrum sunscreen SPF 30+ to sun-exposed areas, reapplying every 2 hours as needed when you're outdoors.    Recommend taking Heliocare sun protection supplement daily in sunny weather for additional sun protection. For maximum protection on the sunniest days, you can take up to 2 capsules of regular Heliocare OR take 1 capsule of Heliocare Ultra. For prolonged exposure (such as a full day in the sun), you can repeat your dose of the supplement 4 hours after your first dose.  Heliocare can be purchased at [facility] or at [email or web address].

## 2020-05-05 NOTE — Progress Notes (Signed)
Follow-Up Visit   Subjective  Brian Glass is a 70 y.o. male who presents for the following: TBSE (Patient here for full body skin exam and skin cancer screening. Patient with hx BCC. There is a spot at chest that's been there for 3-4 months. ).  The following portions of the chart were reviewed this encounter and updated as appropriate:   Tobacco  Allergies  Meds  Problems  Med Hx  Surg Hx  Fam Hx      Review of Systems:  No other skin or systemic complaints except as noted in HPI or Assessment and Plan.  Objective  Well appearing patient in no apparent distress; mood and affect are within normal limits.  A full examination was performed including scalp, head, eyes, ears, nose, lips, neck, chest, axillae, abdomen, back, buttocks, bilateral upper extremities, bilateral lower extremities, hands, feet, fingers, toes, fingernails, and toenails. All findings within normal limits unless otherwise noted below.  Objective  Right Axilla: Subcutaneous nodule.   Objective  Left Medial Thigh: Firm pink/brown papulenodule with dimple sign.   Objective  face: Small yellow papules with a central dell.   Objective  Scalp: Pink patches with greasy scale.    Assessment & Plan  Epidermal inclusion cyst Right Axilla  Benign-appearing. Exam most consistent with an epidermal inclusion cyst. Discussed that a cyst is a benign growth that can grow over time and sometimes get irritated or inflamed. Recommend observation if it is not bothersome. Discussed option of surgical excision to remove it if it is growing, symptomatic, or other changes noted. Please call for new or changing lesions so they can be evaluated.    Dermatofibroma Left Medial Thigh  Benign-appearing.  Observation.  Call clinic for new or changing lesions.  Recommend daily use of broad spectrum spf 30+ sunscreen to sun-exposed areas.    Sebaceous hyperplasia face  Benign-appearing.  Observation.  Call clinic for  new or changing lesions.  Recommend daily use of broad spectrum spf 30+ sunscreen to sun-exposed areas.    Seborrheic dermatitis Scalp  Not bothersome to patient. Continue Head & Shoulders weekly   Lentigines - Scattered tan macules - Discussed due to sun exposure - Benign, observe - Call for any changes  Seborrheic Keratoses - Stuck-on, waxy, tan-brown papules and plaques  - Discussed benign etiology and prognosis. - Observe - Call for any changes  Melanocytic Nevi - Tan-brown and/or pink-flesh-colored symmetric macules and papules - Benign appearing on exam today - Observation - Call clinic for new or changing moles - Recommend daily use of broad spectrum spf 30+ sunscreen to sun-exposed areas.   Hemangiomas - Red papules - Discussed benign nature - Observe - Call for any changes  Actinic Damage - Chronic, secondary to cumulative UV/sun exposure - diffuse scaly erythematous macules with underlying dyspigmentation - Recommend daily broad spectrum sunscreen SPF 30+ to sun-exposed areas, reapply every 2 hours as needed.  - Call for new or changing lesions.  Skin cancer screening performed today.  History of Basal Cell Carcinoma of the Skin - No evidence of recurrence today at left malar cheek 2018 - Recommend regular full body skin exams - Recommend daily broad spectrum sunscreen SPF 30+ to sun-exposed areas, reapply every 2 hours as needed.  - Call if any new or changing lesions are noted between office visits   Return in about 6 months (around 11/02/2020) for TBSE.  Graciella Belton, RMA, am acting as scribe for Forest Gleason, MD .  Documentation: I have reviewed  the above documentation for accuracy and completeness, and I agree with the above.  Forest Gleason, MD

## 2020-05-09 ENCOUNTER — Other Ambulatory Visit: Payer: Self-pay

## 2020-05-09 ENCOUNTER — Encounter: Payer: Self-pay | Admitting: Family Medicine

## 2020-05-09 ENCOUNTER — Ambulatory Visit (INDEPENDENT_AMBULATORY_CARE_PROVIDER_SITE_OTHER): Payer: Medicare Other | Admitting: Family Medicine

## 2020-05-09 VITALS — BP 90/70 | HR 59 | Temp 97.6°F | Ht 70.0 in | Wt 171.5 lb

## 2020-05-09 DIAGNOSIS — M7632 Iliotibial band syndrome, left leg: Secondary | ICD-10-CM

## 2020-05-09 DIAGNOSIS — G5702 Lesion of sciatic nerve, left lower limb: Secondary | ICD-10-CM

## 2020-05-09 NOTE — Progress Notes (Signed)
Avian Konigsberg T. Mckennah Kretchmer, MD, [place]  Primary Care and Sports Medicine [facility] [place], [address]  Phone: [phone]  FAX: [phone]  Brian Glass - 70 y.o. male  MRN [id number]  Date of Birth: 27-Jun-1950  Date: 05/09/2020  PCP: Pleas Koch, NP  Referral: Pleas Koch, NP  Chief Complaint  Patient presents with  . Knee Pain  . Hip Pain    This visit occurred during the SARS-CoV-2 public health emergency.  Safety protocols were in place, including screening questions prior to the visit, additional usage of staff PPE, and extensive cleaning of exam room while observing appropriate contact time as indicated for disinfecting solutions.   Subjective:   Brian Glass is a 70 y.o. very pleasant male patient with Body mass index is 24.61 kg/m. who presents with the following:  L knee pain, started to hurt for a month or so.  Knee was hurting wore / first.  At this point, the patient is having pain in the lateral aspect of his knee in the region of Gerdy's tubercle.  He does have pain with going up and down stairs, and he is able to do spin class without much difficulty.  He does have some difficulty with doing yoga, that he typically will do. Hurts going up and down stairs.    He has not had an effusion, and he has no significant prior knee injuries, fractures, or surgery.  He does not have any mechanical symptoms.  He has not had any functional giving way.  He also has some of what he describes as hip pain, but he has no groin pain.  He has pain predominantly in the posterior buttocks without any significant back pain.  He does not describe any radicular symptoms, numbness, tingling.  L posterior hip pain, buttocks    Review of Systems is noted in the HPI, as appropriate   Objective:   BP 90/70   Pulse (!) 59   Temp 97.6 F (36.4 C) (Temporal)   Ht 5\' 10"  (1.778 m)   Wt 171 lb 8 oz  (77.8 kg)   SpO2 99%   BMI 24.61 kg/m    Knee:  L Gait: Normal heel toe pattern ROM: 0-140 Effusion: neg Echymosis or edema: none Patellar tendon NT Painful PLICA: neg Patellar grind: negative Medial and lateral patellar facet loading: negative medial and lateral joint lines:NT Mcmurray's neg Flexion-pinch neg Varus and valgus stress: stable Lachman: neg Ant and Post drawer: neg Hip abduction, IR, ER: WNL Hip flexion str: 5/5 Hip abd: 5/5 Quad: 5/5 VMO atrophy:No Hamstring concentric and eccentric: 5/5   He does have some pain with palpation of Gertie's tubercle with movement, and his ITB is quite taut   HIP EXAM: SIDE: Left ROM: Abduction, Flexion, Internal and External range of motion: Excellent Pain with terminal IROM and EROM: None GTB: NT SLR: NEG Knees: No effusion FABER: NT REVERSE FABER: NT, neg Piriformis: Tender to palpation on the left  Str: flexion: 5/5 abduction: 5/5 adduction: 5/5 Strength testing non-tender     Radiology: No results found.  Assessment and Plan:     ICD-10-CM   1. Iliotibial band syndrome of left side  M76.32   2. Piriformis syndrome, left  G57.02    I reviewed and gave him handouts for classical rehab of ITB as well as piriformis syndrome.  I do not think that there is internal derangement of knee or hip.  Remarkably good range of motion, excellent strength.  Strongly doubt that this is an intra-articular process.  Aside from this, reassurance, and resumption of activities as tolerated based on pain.  General rice if needed.  Signed,  Maud Deed. Britany Callicott, MD   Outpatient Encounter Medications as of 05/09/2020  Medication Sig  . Calcium Polycarbophil (EQ FIBER LAXATIVE PO) Take 1 capsule by mouth daily.   . fluticasone (FLONASE) 50 MCG/ACT nasal spray Place 1 spray into both nostrils daily as needed for allergies.   . Multiple Vitamins-Minerals (SENIOR MULTIVITAMIN PLUS PO) Take 1 tablet by mouth daily.  Marland Kitchen  olopatadine (PATANOL) 0.1 % ophthalmic solution Place 1 drop into both eyes 2 (two) times daily as needed for allergies.   . Omega-3 Fatty Acids (FISH OIL PO) Take 1,300 mg by mouth daily.  . rosuvastatin (CRESTOR) 5 MG tablet Take 1 tablet (5 mg total) by mouth every evening. For cholesterol. (Patient taking differently: Take 5 mg by mouth every Monday, Wednesday, and Friday. For cholesterol.)  . tadalafil (CIALIS) 10 MG tablet Take 1 tablet (10 mg total) by mouth every other day.   No facility-administered encounter medications on file as of 05/09/2020.

## 2020-05-16 ENCOUNTER — Encounter: Payer: Self-pay | Admitting: Dermatology

## 2020-05-27 ENCOUNTER — Other Ambulatory Visit: Payer: Medicare Other

## 2020-05-27 ENCOUNTER — Other Ambulatory Visit: Payer: Self-pay

## 2020-05-27 DIAGNOSIS — C61 Malignant neoplasm of prostate: Secondary | ICD-10-CM

## 2020-05-28 LAB — PSA: Prostate Specific Ag, Serum: <0.1 ng/mL (ref 0.0–4.0)

## 2020-06-01 ENCOUNTER — Ambulatory Visit (INDEPENDENT_AMBULATORY_CARE_PROVIDER_SITE_OTHER): Payer: Medicare Other | Admitting: Urology

## 2020-06-01 ENCOUNTER — Encounter: Payer: Self-pay | Admitting: Urology

## 2020-06-01 ENCOUNTER — Other Ambulatory Visit: Payer: Self-pay

## 2020-06-01 VITALS — BP 108/57 | HR 76 | Ht 70.0 in | Wt 163.0 lb

## 2020-06-01 DIAGNOSIS — C61 Malignant neoplasm of prostate: Secondary | ICD-10-CM

## 2020-06-01 DIAGNOSIS — R3912 Poor urinary stream: Secondary | ICD-10-CM | POA: Diagnosis not present

## 2020-06-01 DIAGNOSIS — N529 Male erectile dysfunction, unspecified: Secondary | ICD-10-CM

## 2020-06-01 DIAGNOSIS — N401 Enlarged prostate with lower urinary tract symptoms: Secondary | ICD-10-CM | POA: Diagnosis not present

## 2020-06-01 MED ORDER — TADALAFIL 20 MG PO TABS: 20.0000 mg | ORAL_TABLET | Freq: Every day | ORAL | 6 refills | Status: DC | PRN

## 2020-06-01 NOTE — Progress Notes (Signed)
   06/01/2020 4:18 PM   [name] Jan 13, 1951 [id number]  Reason for visit: Follow up prostate cancer, erectile dysfunction  HPI: I saw [name] back in urology clinic for prostate cancer follow-up after undergoing robotic prostatectomy for favorable intermediate risk prostate cancer on 04/06/2019.Final pathology showed Gleason score 3+4 =7 disease in 25% of the prostate with negative margins,stage pT2Nx.A bilateral nerve sparing procedure was performed  His first postop PSAon 3/29/2021was<0.1.  PSA remains 0 on 05/27/2020  Overall, has been doing well since surgery.   He is urinating with a strong stream.  He denies any stress incontinence, however he occasionally will leak a few drops of urine randomly throughout the day.  He has gone back to wearing 1 pad a day primarily for safety.  He denies any significant urgency or urge incontinence.  In terms of erections, he really has not had any significant erections even on Cialis 10 mg every other day.  I recommended increasing to 20 mg daily, and we also discussed alternative options including a trial of Viagra, vacuum erection device, penile injections, or penile prosthesis.  Finally, we discussed the need to continue to follow the PSA long-term, and repeat in 6 months for an additional year, then spaced to a year if PSA remains undetectable   RTC 6 months for PSA prior and symptom check regarding ED Cialis increased to 20 mg daily Encouraged him to contact us if he is still not having any erections in the next month, and we can try sildenafil 100 mg on demand  Billey Co, MD  [facility] [address] [place], [place] [address] [phone]

## 2020-06-01 NOTE — Patient Instructions (Signed)

## 2020-06-20 ENCOUNTER — Ambulatory Visit (INDEPENDENT_AMBULATORY_CARE_PROVIDER_SITE_OTHER): Payer: Medicare Other | Admitting: Family Medicine

## 2020-06-20 ENCOUNTER — Encounter: Payer: Self-pay | Admitting: Family Medicine

## 2020-06-20 ENCOUNTER — Ambulatory Visit (INDEPENDENT_AMBULATORY_CARE_PROVIDER_SITE_OTHER)
Admission: RE | Admit: 2020-06-20 | Discharge: 2020-06-20 | Disposition: A | Discharge status: Home / Self Care | Payer: Medicare Other | Source: Ambulatory Visit | Attending: Family Medicine | Admitting: Family Medicine

## 2020-06-20 ENCOUNTER — Other Ambulatory Visit: Payer: Self-pay

## 2020-06-20 VITALS — BP 100/70 | HR 69 | Temp 97.7°F | Ht 70.0 in | Wt 171.5 lb

## 2020-06-20 DIAGNOSIS — M25552 Pain in left hip: Secondary | ICD-10-CM | POA: Diagnosis not present

## 2020-06-20 DIAGNOSIS — M1612 Unilateral primary osteoarthritis, left hip: Secondary | ICD-10-CM | POA: Diagnosis not present

## 2020-06-20 DIAGNOSIS — M25562 Pain in left knee: Secondary | ICD-10-CM

## 2020-06-20 DIAGNOSIS — M1712 Unilateral primary osteoarthritis, left knee: Secondary | ICD-10-CM | POA: Diagnosis not present

## 2020-06-20 DIAGNOSIS — M7711 Lateral epicondylitis, right elbow: Secondary | ICD-10-CM | POA: Diagnosis not present

## 2020-06-20 IMAGING — DX DG HIP (WITH OR WITHOUT PELVIS) 2-3V*L*
3 series · 3 of 3 positions shown · non-contrast
Comparison: None.

CLINICAL DATA: Left hip pain for several months.

EXAM:
DG HIP (WITH OR WITHOUT PELVIS) 2-3V LEFT

[pelvis ap]
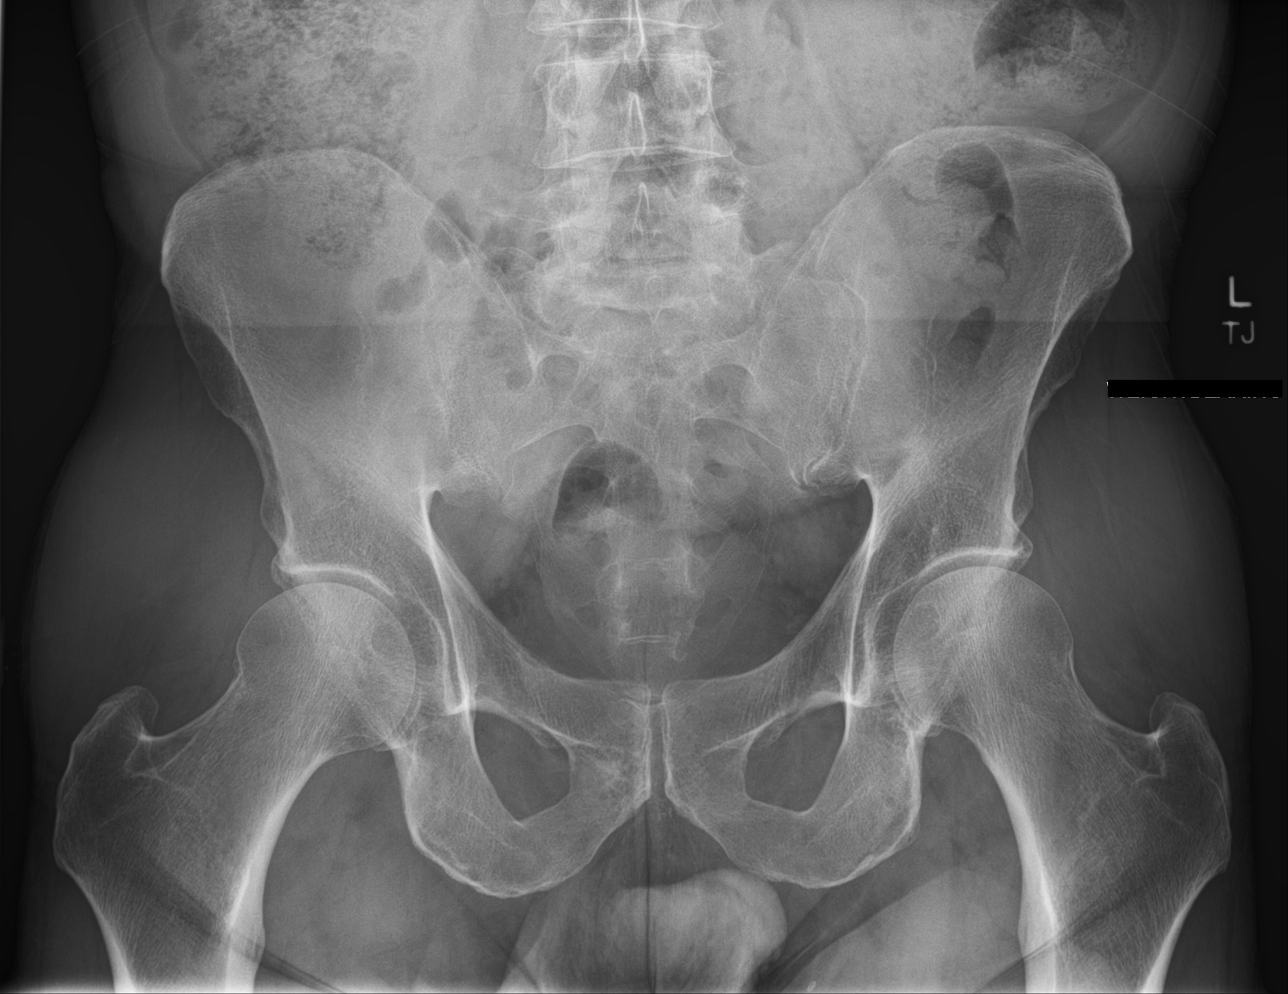

[hip ap]
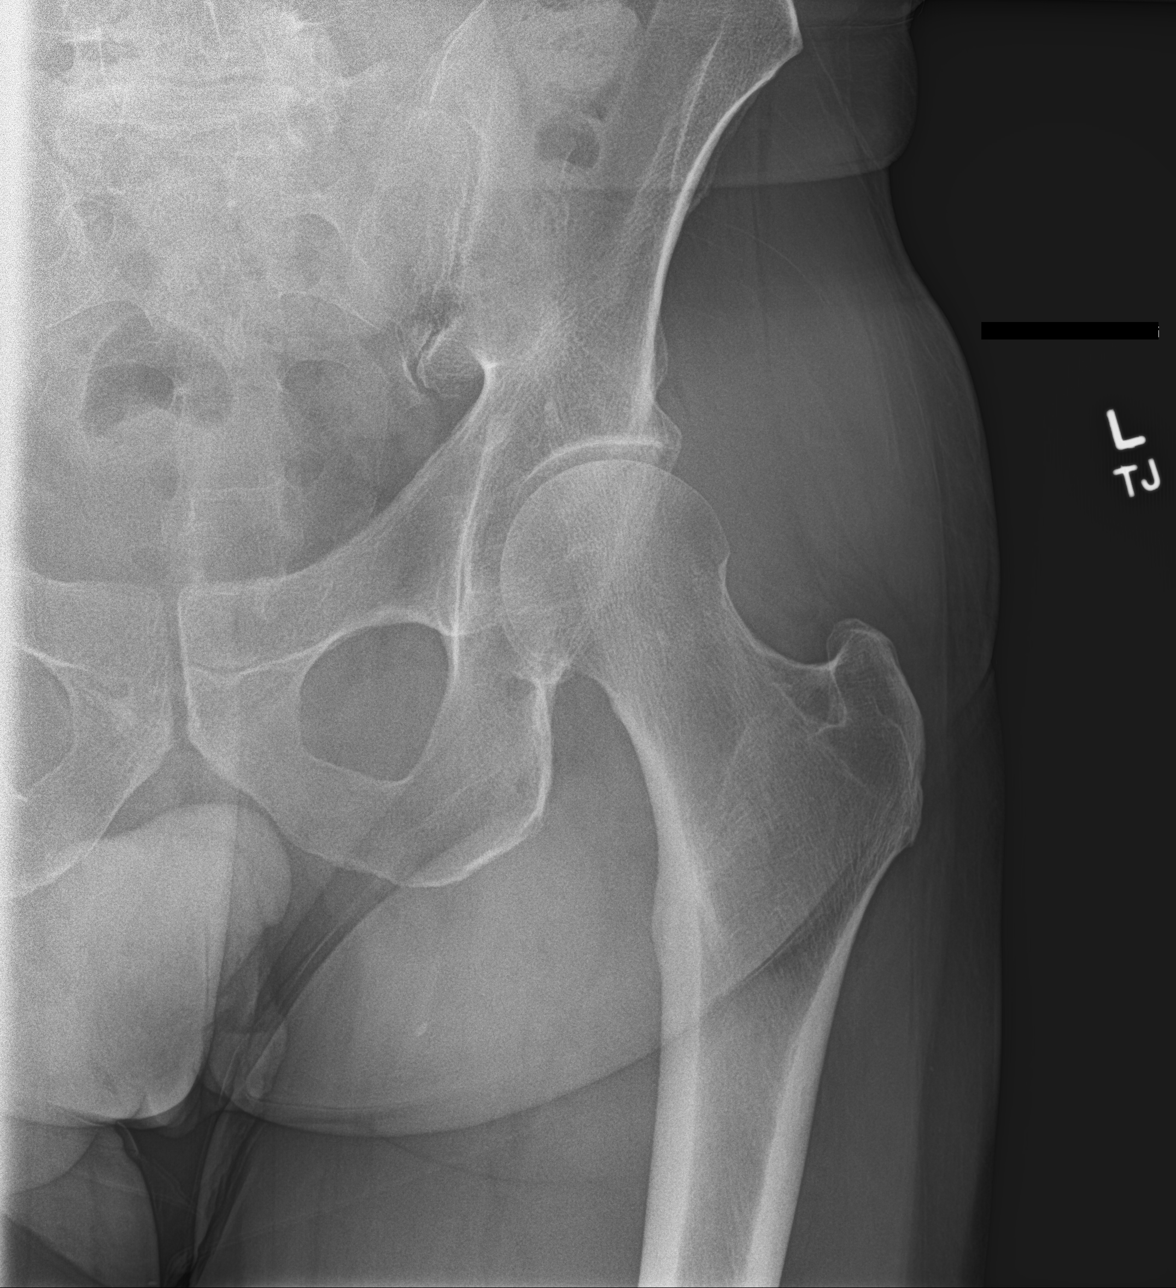

[hip lat]
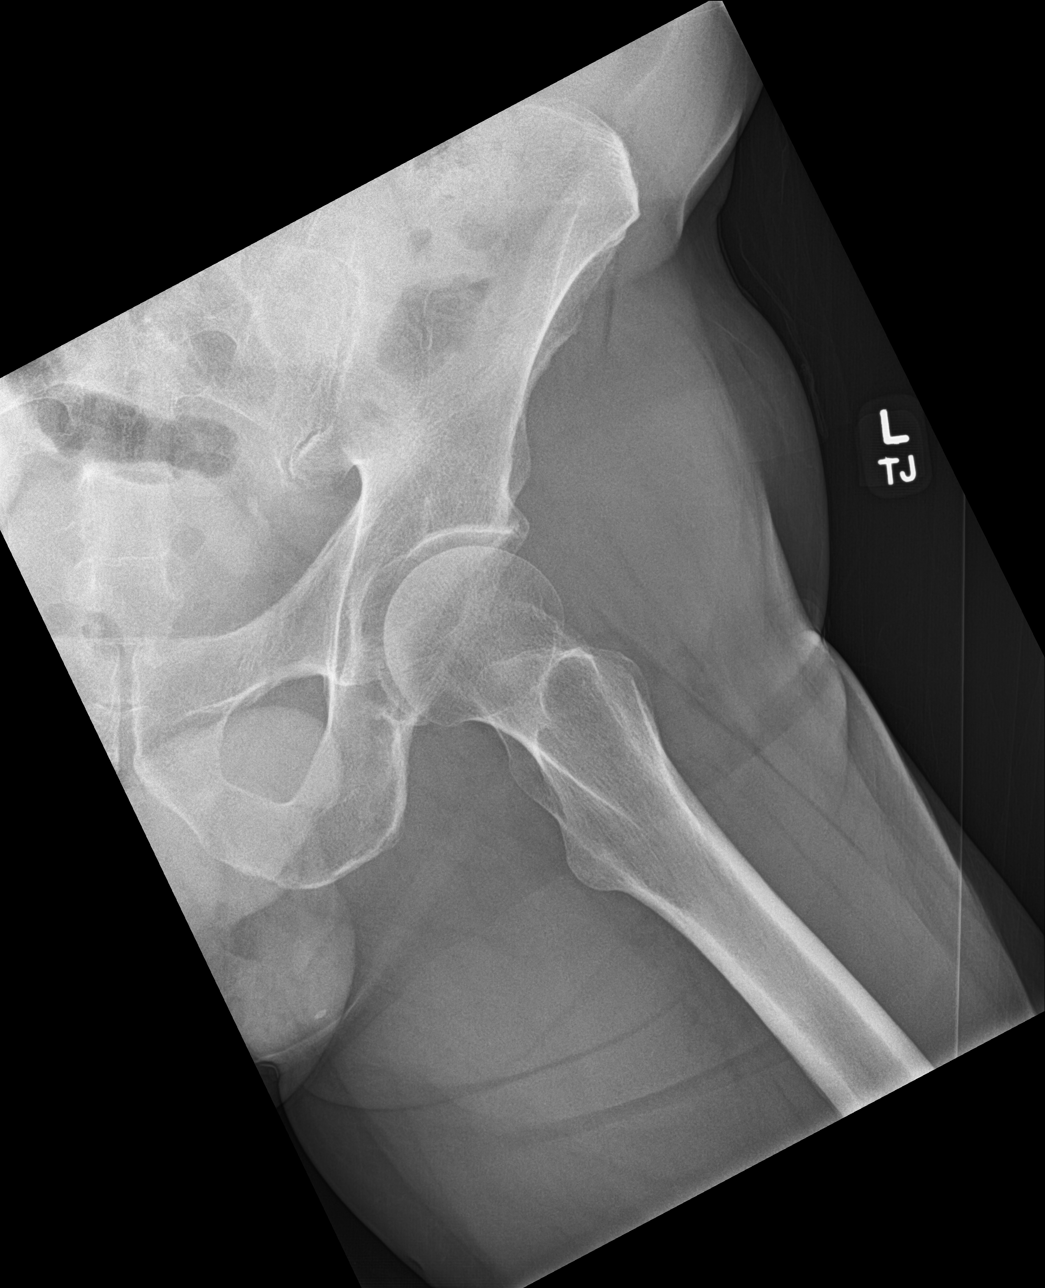

[3 of 3 positions shown; findings below may reference images not displayed]

FINDINGS: Weightbearing AP view of the pelvis with AP and lateral views of the
left hip. Left femoral head is well seated in the acetabulum. The
hip joint spaces preserved. No significant osteophytes. No erosions.
No evidence of avascular necrosis or focal bone lesion. Pubic rami
are intact. Pubic symphysis and sacroiliac joints are congruent. No
soft tissue abnormality.
IMPRESSION: Unremarkable radiographs of the left hip.

## 2020-06-20 MED ORDER — PREDNISONE 20 MG PO TABS: ORAL_TABLET | ORAL | 0 refills | Status: DC

## 2020-06-20 NOTE — Progress Notes (Signed)
Brian Plaia T. Avaree Gilberti, MD, [place]  Primary Care and Sports Medicine [facility] at [facility] [place], [address]  Phone: [phone]  FAX: [phone]  Brian Glass - 70 y.o. male  MRN [id number]  Date of Birth: May 03, 1950  Date: 06/20/2020  PCP: Pleas Koch, NP  Referral: Pleas Koch, NP  Chief Complaint  Patient presents with  . Follow-up    Left Hip/Knee Pain seen on 05/09/20-Not much better  . Elbow Pain    Right    This visit occurred during the SARS-CoV-2 public health emergency.  Safety protocols were in place, including screening questions prior to the visit, additional usage of staff PPE, and extensive cleaning of exam room while observing appropriate contact time as indicated for disinfecting solutions.   Subjective:   Brian Glass is a 70 y.o. very pleasant male patient with Body mass index is 24.61 kg/m. who presents with the following:  When I last saw him, I felt like he was having some ITBS on the left.  He does complain of some knee pain off and on and he has a popliteal fullness.  Right now, his knee pain is the limiting factor for his activity, and does have some pain going up and down stairs.  He is not having any mechanical symptoms, and he is not having any functional giving way.  No prior major injuries or surgery.  No significant effusion.  He has not had any falls.  He also describes some hip pain on the left in the front and back of his hip.  He denies any back pain, and he does not have any radicular pain, numbness, or tingling.  He does not have any lateral hip pain.  He denies saddle anesthesia or loss of bowel or bladder.  He also complains of R elbow pain today.  This is lateral.  It bothers him with supination and extension at the wrist.  This is much less of an issue compared to his knee and hip.  Review of Systems is noted in the HPI, as appropriate   Objective:   BP  100/70   Pulse 69   Temp 97.7 F (36.5 C) (Temporal)   Ht 5\' 10"  (1.778 m)   Wt 171 lb 8 oz (77.8 kg)   SpO2 97%   BMI 24.61 kg/m    HIP EXAM: SIDE: L ROM: Abduction, Flexion, Internal and External range of motion: full Pain with terminal IROM and EROM: none FADIR is negative FABER and reverse FABER are negative GTB: NT SLR: NEG Knees: No effusion Piriformis: NT at direct palpation Str: flexion: 5/5 abduction: 5/5 adduction: 5/5 Strength testing non-tender SLR negative  Rises to examination table with no difficulty Gait: non antalgic Paraspinus Tenderness: None   Knee:  l Gait: Normal heel toe pattern ROM: 0 - 140+ Effusion: neg Echymosis or edema: none Patellar tendon NT Painful PLICA: neg Patellar grind: negative Medial and lateral patellar facet loading: negative medial and lateral joint lines:NT Mcmurray's neg Flexion-pinch does provoke some pain Varus and valgus stress: stable Lachman: neg Ant and Post drawer: neg Hip abduction, IR, ER: WNL Hip flexion str: 5/5 Hip abd: 5/5 Quad: 5/5 VMO atrophy:No Hamstring concentric and eccentric: 5/5   Elbow: R Ecchymosis or edema: neg ROM: full flexion, extension, pronation, supination Shoulder ROM: Full Flexion: 5/5 Extension: 5/5, PAINFUL, mild Supination: 5/5, PAINFUL, mild Pronation: 5/5 Wrist ext: 5/5 Wrist flexion: 5/5 No gross bony abnormality Varus and  Valgus stress: stable ECRB tenderness: YES, TTP Medial epicondyle: NT Lateral epicondyle, resisted wrist extension from wrist full pronation and flexion: PAINFUL grip: 5/5  sensation intact Tinel's, Elbow: negative   Radiology: DG Knee 4 Views W/Patella Left  Result Date: 06/20/2020 CLINICAL DATA:  Probable osteoarthritis.  Left hip pain. EXAM: LEFT KNEE - COMPLETE 4+ VIEW COMPARISON:  None. FINDINGS: Weightbearing AP view both knees with weight-bearing tunnel, lateral, as well as patellar sunrise views of the left knee. Normal joint spaces  and alignment. Trace patellofemoral spurring. Patella normally situated in the trochlear groove. Small quadriceps tendon enthesophyte. No significant joint effusion. No fracture, erosion or bone destruction. Frontal view of the right knee is unremarkable. IMPRESSION: Minimal osteoarthritis of the left knee. Electronically Signed   By: Keith Rake M.D.   On: 06/20/2020 19:19   DG Hip Unilat W OR W/O Pelvis 2-3 Views Left  Result Date: 06/20/2020 CLINICAL DATA:  Left hip pain for several months. EXAM: DG HIP (WITH OR WITHOUT PELVIS) 2-3V LEFT COMPARISON:  None. FINDINGS: Weightbearing AP view of the pelvis with AP and lateral views of the left hip. Left femoral head is well seated in the acetabulum. The hip joint spaces preserved. No significant osteophytes. No erosions. No evidence of avascular necrosis or focal bone lesion. Pubic rami are intact. Pubic symphysis and sacroiliac joints are congruent. No soft tissue abnormality. IMPRESSION: Unremarkable radiographs of the left hip. Electronically Signed   By: Keith Rake M.D.   On: 06/20/2020 19:19     Assessment and Plan:     ICD-10-CM   1. Acute pain of left knee  M25.562 DG Knee 4 Views W/Patella Left  2. Acute hip pain, left  M25.552 DG Hip Unilat W OR W/O Pelvis 2-3 Views Left  3. Lateral epicondylitis of right elbow  M77.11    His knee and hip range of motion is better than virtually all 69 year old.  His hip exam is unremarkable and very reassuring.  His knee exam shows some pain with terminal flexion at beyond 140 degreese does have some minimal medial compartmental osteoarthritis on plain film.  I did independently review all hip and knee films myself.  He also has some relatively mild lateral epicondylitis of the right elbow.  Multipart musculoskeletal pain.  My suspicion is that he does have some degenerative changes even at the hip that are causing some of his issues.  I am going to give him some oral steroids, hopefully this  will help with all of these issues.  I encouraged him to continue to be active, and he is able to do spinning classes 3 times a week.  This is great for his knee and hip.  Meds ordered this encounter  Medications  . predniSONE (DELTASONE) 20 MG tablet    Sig: 2 tabs po daily for 5 days, then 1 tab po daily for 5 days    Dispense:  15 tablet    Refill:  0   There are no discontinued medications. Orders Placed This Encounter  Procedures  . DG Knee 4 Views W/Patella Left  . DG Hip Unilat W OR W/O Pelvis 2-3 Views Left    Follow-up: No follow-ups on file.  Signed,  Maud Deed. Jaleena Viviani, MD   Outpatient Encounter Medications as of 06/20/2020  Medication Sig  . Calcium Polycarbophil (EQ FIBER LAXATIVE PO) Take 1 capsule by mouth daily.   . fluticasone (FLONASE) 50 MCG/ACT nasal spray Place 1 spray into both nostrils daily as needed for  allergies.   . Multiple Vitamins-Minerals (SENIOR MULTIVITAMIN PLUS PO) Take 1 tablet by mouth daily.  Marland Kitchen olopatadine (PATANOL) 0.1 % ophthalmic solution Place 1 drop into both eyes 2 (two) times daily as needed for allergies.   . Omega-3 Fatty Acids (FISH OIL PO) Take 1,300 mg by mouth daily.  . predniSONE (DELTASONE) 20 MG tablet 2 tabs po daily for 5 days, then 1 tab po daily for 5 days  . rosuvastatin (CRESTOR) 5 MG tablet Take 1 tablet (5 mg total) by mouth every evening. For cholesterol.  . tadalafil (CIALIS) 20 MG tablet Take 1 tablet (20 mg total) by mouth daily as needed for erectile dysfunction.   No facility-administered encounter medications on file as of 06/20/2020.

## 2020-07-20 DIAGNOSIS — Z23 Encounter for immunization: Secondary | ICD-10-CM | POA: Diagnosis not present

## 2020-07-29 DIAGNOSIS — U071 COVID-19: Secondary | ICD-10-CM

## 2020-07-29 MED ORDER — NIRMATRELVIR/RITONAVIR (PAXLOVID) TABLET (RENAL DOSING): 2.0000 | ORAL_TABLET | Freq: Two times a day (BID) | ORAL | 0 refills | Status: AC

## 2020-11-02 ENCOUNTER — Ambulatory Visit (INDEPENDENT_AMBULATORY_CARE_PROVIDER_SITE_OTHER): Payer: Medicare Other | Admitting: Dermatology

## 2020-11-02 ENCOUNTER — Other Ambulatory Visit: Payer: Self-pay

## 2020-11-02 DIAGNOSIS — D229 Melanocytic nevi, unspecified: Secondary | ICD-10-CM | POA: Diagnosis not present

## 2020-11-02 DIAGNOSIS — L821 Other seborrheic keratosis: Secondary | ICD-10-CM | POA: Diagnosis not present

## 2020-11-02 DIAGNOSIS — Z85828 Personal history of other malignant neoplasm of skin: Secondary | ICD-10-CM

## 2020-11-02 DIAGNOSIS — L570 Actinic keratosis: Secondary | ICD-10-CM | POA: Diagnosis not present

## 2020-11-02 DIAGNOSIS — D1800 Hemangioma unspecified site: Secondary | ICD-10-CM | POA: Diagnosis not present

## 2020-11-02 DIAGNOSIS — L578 Other skin changes due to chronic exposure to nonionizing radiation: Secondary | ICD-10-CM

## 2020-11-02 DIAGNOSIS — D239 Other benign neoplasm of skin, unspecified: Secondary | ICD-10-CM

## 2020-11-02 DIAGNOSIS — Z1283 Encounter for screening for malignant neoplasm of skin: Secondary | ICD-10-CM | POA: Diagnosis not present

## 2020-11-02 DIAGNOSIS — D2372 Other benign neoplasm of skin of left lower limb, including hip: Secondary | ICD-10-CM | POA: Diagnosis not present

## 2020-11-02 DIAGNOSIS — L814 Other melanin hyperpigmentation: Secondary | ICD-10-CM | POA: Diagnosis not present

## 2020-11-02 DIAGNOSIS — D2371 Other benign neoplasm of skin of right lower limb, including hip: Secondary | ICD-10-CM | POA: Diagnosis not present

## 2020-11-02 NOTE — Progress Notes (Signed)
Follow-Up Visit   Subjective  Brian Glass is a 70 y.o. male who presents for the following: TBSE (Patient here for full body skin exam and skin cancer screening. Patient with hx of BCC at left malar cheek and hx of AK's. Patient advises he does have some rough spots at face and a few spots at chest. Places at chest present for a few months, come and go and sting when touched. ).   The following portions of the chart were reviewed this encounter and updated as appropriate:   Tobacco  Allergies  Meds  Problems  Med Hx  Surg Hx  Fam Hx      Review of Systems:  No other skin or systemic complaints except as noted in HPI or Assessment and Plan.  Objective  Well appearing patient in no apparent distress; mood and affect are within normal limits.  A full examination was performed including scalp, head, eyes, ears, nose, lips, neck, chest, axillae, abdomen, back, buttocks, bilateral upper extremities, bilateral lower extremities, hands, feet, fingers, toes, fingernails, and toenails. All findings within normal limits unless otherwise noted below.  Left chest x 1, left dorsal hand Erythematous thin papules/macules with gritty scale.   Left chest left of midline Erythematous keratotic or waxy stuck-on papule or plaque.   Left thigh, right lateral knee Firm pink/brown papulenodule with dimple sign.    Assessment & Plan  AK (actinic keratosis) Left chest x 1, left dorsal hand  Left chest hypertrophic  Prior to procedure, discussed risks of blister formation, small wound, skin dyspigmentation, or rare scar following cryotherapy. Recommend Vaseline ointment to treated areas while healing.  Patient to treat left dorsal hand at home with 5FU/calcipotriene   Destruction of lesion - Left chest x 1, left dorsal hand  Destruction method: cryotherapy   Informed consent: discussed and consent obtained   Lesion destroyed using liquid nitrogen: Yes   Cryotherapy cycles:  2 Outcome:  patient tolerated procedure well with no complications   Post-procedure details: wound care instructions given    Seborrheic keratosis Left chest left of midline  Prior to procedure, discussed risks of blister formation, small wound, skin dyspigmentation, or rare scar following cryotherapy. Recommend Vaseline ointment to treated areas while healing.   Destruction of lesion - Left chest left of midline  Destruction method: cryotherapy   Informed consent: discussed and consent obtained   Lesion destroyed using liquid nitrogen: Yes   Cryotherapy cycles:  2 Outcome: patient tolerated procedure well with no complications   Post-procedure details: wound care instructions given    Dermatofibroma Left thigh, right lateral knee  Benign-appearing.  Observation.  Call clinic for new or changing lesions.     History of Basal Cell Carcinoma of the Skin - No evidence of recurrence today - Recommend regular full body skin exams - Recommend daily broad spectrum sunscreen SPF 30+ to sun-exposed areas, reapply every 2 hours as needed.  - Call if any new or changing lesions are noted between office visits  Lentigines - Scattered tan macules - Due to sun exposure - Benign-appering, observe - Recommend daily broad spectrum sunscreen SPF 30+ to sun-exposed areas, reapply every 2 hours as needed. - Call for any changes  Seborrheic Keratoses - Stuck-on, waxy, tan-brown papules and/or plaques  - Benign-appearing - Discussed benign etiology and prognosis. - Observe - Call for any changes  Melanocytic Nevi - Tan-brown and/or pink-flesh-colored symmetric macules and papules - Benign appearing on exam today - Observation - Call clinic for new  or changing moles - Recommend daily use of broad spectrum spf 30+ sunscreen to sun-exposed areas.   Hemangiomas - Red papules - Discussed benign nature - Observe - Call for any changes  Actinic Damage - Severe, confluent actinic changes with  pre-cancerous actinic keratoses  - Severe, chronic, not at goal, secondary to cumulative UV radiation exposure over time - diffuse scaly erythematous macules and papules with underlying dyspigmentation - Discussed Prescription "Field Treatment" for Severe, Chronic Confluent Actinic Changes with Pre-Cancerous Actinic Keratoses Field treatment involves treatment of an entire area of skin that has confluent Actinic Changes (Sun/ Ultraviolet light damage) and PreCancerous Actinic Keratoses by method of PhotoDynamic Therapy (PDT) and/or prescription Topical Chemotherapy agents such as 5-fluorouracil, 5-fluorouracil/calcipotriene, and/or imiquimod.  The purpose is to decrease the number of clinically evident and subclinical PreCancerous lesions to prevent progression to development of skin cancer by chemically destroying early precancer changes that may or may not be visible.  It has been shown to reduce the risk of developing skin cancer in the treated area. As a result of treatment, redness, scaling, crusting, and open sores may occur during treatment course. One or more than one of these methods may be used and may have to be used several times to control, suppress and eliminate the PreCancerous changes. Discussed treatment course, expected reaction, and possible side effects. - Recommend daily broad spectrum sunscreen SPF 30+ to sun-exposed areas, reapply every 2 hours as needed.  - Staying in the shade or wearing long sleeves, sun glasses (UVA+UVB protection) and wide brim hats (4-inch brim around the entire circumference of the hat) are also recommended. - Call for new or changing lesions. - Start 5-fluorouracil/calcipotriene cream twice a day for 4 days to affected areas including face and up to 7 days at hairline and left hand. Prescription sent to [facility]. Patient advised they will receive an email to purchase the medication online and have it sent to their home. Patient  provided with handout reviewing treatment course and side effects and advised to call or message [place] on MyChart with any concerns.   Skin cancer screening performed today.  Return in about 1 year (around 11/02/2021) for TBSE., 3-6 months for AK and actinic damage recheck  Graciella Belton, RMA, am acting as scribe for Forest Gleason, MD .  Documentation: I have reviewed the above documentation for accuracy and completeness, and I agree with the above.  Forest Gleason, MD

## 2020-11-02 NOTE — Patient Instructions (Addendum)
Instructions for [facility] Medications  One or more of your medications was sent to the [facility] mail order compounding pharmacy. You will receive an email from them and can purchase the medicine through that link. It will then be mailed to your home at the address you confirmed. If for any reason you do not receive an email from them, please check your spam folder. If you still do not find the email, please let us know. [facility] phone number is [phone].   - Start 5-fluorouracil/calcipotriene cream twice a day for 4 days to affected areas including face and up to 7 days at hairline and left hand. Prescription sent to [facility]. Patient advised they will receive an email to purchase the medication online and have it sent to their home. Patient provided with handout reviewing treatment course and side effects and advised to call or message [place] on MyChart with any concerns.  Melanoma ABCDEs  Melanoma is the most dangerous type of skin cancer, and is the leading cause of death from skin disease.  You are more likely to develop melanoma if you: Have light-colored skin, light-colored eyes, or red or blond hair Spend a lot of time in the sun Tan regularly, either outdoors or in a tanning bed Have had blistering sunburns, especially during childhood Have a close family member who has had a melanoma Have atypical moles or large birthmarks  Early detection of melanoma is key since treatment is typically straightforward and cure rates are extremely high if we catch it early.   The first sign of melanoma is often a change in a mole or a new dark spot.  The ABCDE system is a way of remembering the signs of melanoma.  A for asymmetry:  The two halves do not match. B for border:  The edges of the growth are irregular. C for color:  A mixture of colors are present instead of an even brown color. D for diameter:  Melanomas are usually (but not always) greater  than 64m - the size of a pencil eraser. E for evolution:  The spot keeps changing in size, shape, and color.  Please check your skin once per month between visits. You can use a small mirror in front and a large mirror behind you to keep an eye on the back side or your body.   If you see any new or changing lesions before your next follow-up, please call to schedule a visit.  Please continue daily skin protection including broad spectrum sunscreen SPF 30+ to sun-exposed areas, reapplying every 2 hours as needed when you're outdoors.    If you have any questions or concerns for your doctor, please call our main line at [phone] press option 4 to reach your doctor's medical assistant. If no one answers, please leave a voicemail as directed and we will return your call as soon as possible. Messages left after 4 pm will be answered the following business day.   You may also send [place] message via [facility] We typically respond to MyChart messages within 1-2 business days.  For prescription refills, please ask your pharmacy to contact our office. Our fax number is [phone]  If you have an urgent issue when the clinic is closed that cannot wait until the next business day, you can page your doctor at the number below.    Please note that while we do our best to be available for urgent issues outside of office hours, we  are not available 24/7.   If you have an urgent issue and are unable to reach [place], you may choose to seek medical care at your doctor's office, retail clinic, urgent care center, or emergency room.  If you have a medical emergency, please immediately call 911 or go to the emergency department.  Pager Numbers  - Dr. Nehemiah Massed: [phone]  - Dr. Laurence Ferrari: [phone]  - Dr. Nicole Kindred: [phone]  In the event of inclement weather, please call our main line at [phone] for an update on the status of any delays or closures.  Dermatology Medication Tips: Please keep  the boxes that topical medications come in in order to help keep track of the instructions about where and how to use these. Pharmacies typically print the medication instructions only on the boxes and not directly on the medication tubes.   If your medication is too expensive, please contact our office at [phone] option 4 or send [place] a message through [place].   We are unable to tell what your co-pay for medications will be in advance as this is different depending on your insurance coverage. However, we may be able to find a substitute medication at lower cost or fill out paperwork to get insurance to cover a needed medication.   If a prior authorization is required to get your medication covered by your insurance company, please allow [place] 1-2 business days to complete this process.  Drug prices often vary depending on where the prescription is filled and some pharmacies may offer cheaper prices.  The website www.goodrx.com contains coupons for medications through different pharmacies. The prices here do not account for what the cost may be with help from insurance (it may be cheaper with your insurance), but the website can give you the price if you did not use any insurance.  - You can print the associated coupon and take it with your prescription to the pharmacy.  - You may also stop by our office during regular business hours and pick up a GoodRx coupon card.  - If you need your prescription sent electronically to a different pharmacy, notify our office through [facility] or by phone at [phone] option 4.  Recommend taking Heliocare sun protection supplement daily in sunny weather for additional sun protection. For maximum protection on the sunniest days, you can take up to 2 capsules of regular Heliocare OR take 1 capsule of Heliocare Ultra. For prolonged exposure (such as a full day in the sun), you can repeat your dose of the supplement 4 hours after your first dose. Heliocare  can be purchased at [facility] or at [email or web address].

## 2020-11-07 ENCOUNTER — Encounter: Payer: Self-pay | Admitting: Dermatology

## 2020-11-10 ENCOUNTER — Encounter: Payer: Self-pay | Admitting: Family Medicine

## 2020-11-10 ENCOUNTER — Other Ambulatory Visit: Payer: Self-pay

## 2020-11-10 ENCOUNTER — Ambulatory Visit (INDEPENDENT_AMBULATORY_CARE_PROVIDER_SITE_OTHER): Payer: Medicare Other | Admitting: Family Medicine

## 2020-11-10 VITALS — BP 100/60 | HR 59 | Temp 97.7°F | Ht 70.0 in | Wt 168.5 lb

## 2020-11-10 DIAGNOSIS — M7711 Lateral epicondylitis, right elbow: Secondary | ICD-10-CM | POA: Diagnosis not present

## 2020-11-10 NOTE — Progress Notes (Signed)
Brian Glass Glass. Brian Ozbun, MD, [place] at [facility] [place], [address]  Phone: [phone]  FAX: [phone]  Brian Glass - 70 y.o. male  MRN [id number]  Date of Birth: 06/18/1950  Date: 11/10/2020  PCP: Pleas Koch, NP  Referral: Pleas Koch, NP  Chief Complaint  Patient presents with   Elbow Pain    Right    This visit occurred during the SARS-CoV-2 public health emergency.  Safety protocols were in place, including screening questions prior to the visit, additional usage of staff PPE, and extensive cleaning of exam room while observing appropriate contact time as indicated for disinfecting solutions.   Subjective:   Brian Glass presents with lateral elbow pain.  Length of symptoms: 4 mo Hand effected: R  Patient describes a dull ache on the lateral elbow. There is some translation in the proximal forearm and in the distal upper arm. It is painful to lift with the hand facing down and to lift with the thumb in an upright position. Supination is painful. Patient points to the lateral epicondyle as the point of maximal tenderness near ECRB.  R elbow struck and then has been turning wood.  April / may onset.  Hs gotten a little bit better.  Wakes up in the am and will hurt  Ice Nsaid in the am  Dog treats will hurt A pound or two of weight lifting bothers it.  No prior fractures or operative interventions in the effective hand. Prior PT or HEP: none  Denies numbness or tingling. No significant neck or shoulder pain.   Review of Systems is noted in the HPI, as appropriate  Objective:   Blood pressure 100/60, pulse (!) 59, temperature 97.7 F (36.5 C), temperature source Temporal, height '5\' 10"'$  (1.778 m), weight 168 lb 8 oz (76.4 kg), SpO2 99 %.  GEN: No acute distress; alert,appropriate. PULM: Breathing comfortably in no respiratory distress PSYCH: Normally interactive.    Elbow: R Ecchymosis or edema: neg ROM: full flexion, extension, pronation, supination Shoulder ROM: Full Flexion: 5/5 Extension: 5/5, PAINFUL Supination: 5/5, PAINFUL Pronation: 5/5 Wrist ext: 5/5 Wrist flexion: 5/5 No gross bony abnormality Varus and Valgus stress: stable ECRB tenderness: YES, TTP Medial epicondyle: NT Lateral epicondyle, resisted wrist extension from wrist full pronation and flexion: PAINFUL grip: 5/5  sensation intact Tinel's, Elbow: negative  Subjective:     ICD-10-CM   1. Lateral epicondylitis of right elbow  M77.11       Elbow anatomy was reviewed, and tendinopathy was explained.  Pt. given a formal rehab program from [facility] on elbow rehabiliation.  Start off with isometrics and gentle stretching and ROM progressing to a series of concentric and eccentric exercises should be done starting with no weight, work up to 1 lb, hammer, etc.  Use counterforce strap if working or using hands.  Emphasized stretching an cross-friction massage Emphasized proper palms up lifting biomechanics to unload ECRB  If sx persistent, PT or injection reasonable  Follow-up: prn  Signed,  Brian Glass. Brian Dymond, MD   Patient's Medications  New Prescriptions   No medications on file  Previous Medications   CALCIUM POLYCARBOPHIL (EQ FIBER LAXATIVE PO)    Take 1 capsule by mouth daily.    FLUTICASONE (FLONASE) 50 MCG/ACT NASAL SPRAY    Place 1 spray into both nostrils daily as needed for allergies.    MULTIPLE VITAMINS-MINERALS (SENIOR MULTIVITAMIN PLUS PO)    Take 1 tablet by  mouth daily.   OLOPATADINE (PATANOL) 0.1 % OPHTHALMIC SOLUTION    Place 1 drop into both eyes 2 (two) times daily as needed for allergies.    OMEGA-3 FATTY ACIDS (FISH OIL PO)    Take 1,300 mg by mouth daily.   ROSUVASTATIN (CRESTOR) 5 MG TABLET    Take 1 tablet (5 mg total) by mouth every evening. For cholesterol.   TADALAFIL (CIALIS) 20 MG TABLET    Take 1 tablet (20 mg total) by mouth  daily as needed for erectile dysfunction.  Modified Medications   No medications on file  Discontinued Medications   PREDNISONE (DELTASONE) 20 MG TABLET    2 tabs po daily for 5 days, then 1 tab po daily for 5 days

## 2020-11-10 NOTE — Patient Instructions (Signed)
Voltaren 1% gel, over the counter ?You can apply up to 4 times a day ? ?This can be applied to any joint: knee, wrist, fingers, elbows, shoulders, feet and ankles. ?Can apply to any tendon: tennis elbow, achilles, tendon, rotator cuff or any other tendon. ? ?Minimal is absorbed in the bloodstream: ok with oral anti-inflammatory or a blood thinner. ? ?Cost is about 9 dollars  ?

## 2020-12-02 ENCOUNTER — Other Ambulatory Visit: Payer: Self-pay

## 2020-12-02 ENCOUNTER — Other Ambulatory Visit: Payer: Medicare Other

## 2020-12-02 DIAGNOSIS — C61 Malignant neoplasm of prostate: Secondary | ICD-10-CM

## 2020-12-07 ENCOUNTER — Ambulatory Visit: Payer: Medicare Other | Admitting: Urology

## 2020-12-07 LAB — PSA: Prostate Specific Ag, Serum: <0.1 ng/mL (ref 0.0–4.0)

## 2020-12-15 ENCOUNTER — Other Ambulatory Visit: Payer: Self-pay

## 2020-12-15 ENCOUNTER — Ambulatory Visit (INDEPENDENT_AMBULATORY_CARE_PROVIDER_SITE_OTHER): Payer: Medicare Other | Admitting: Urology

## 2020-12-15 ENCOUNTER — Encounter: Payer: Self-pay | Admitting: Urology

## 2020-12-15 VITALS — BP 108/61 | HR 69 | Ht 70.0 in | Wt 164.0 lb

## 2020-12-15 DIAGNOSIS — N39498 Other specified urinary incontinence: Secondary | ICD-10-CM | POA: Diagnosis not present

## 2020-12-15 DIAGNOSIS — C61 Malignant neoplasm of prostate: Secondary | ICD-10-CM

## 2020-12-15 DIAGNOSIS — N529 Male erectile dysfunction, unspecified: Secondary | ICD-10-CM

## 2020-12-15 MED ORDER — SILDENAFIL CITRATE 100 MG PO TABS: 100.0000 mg | ORAL_TABLET | Freq: Every day | ORAL | 6 refills | Status: DC | PRN

## 2020-12-15 NOTE — Progress Notes (Signed)
   12/15/2020 4:23 PM   [name] 1951/01/06 [id number]  Reason for visit: Follow up prostate cancer, erectile dysfunction  HPI: I saw Mr. Giovanelli back in urology clinic for prostate cancer follow-up after undergoing robotic prostatectomy for favorable intermediate risk prostate cancer on 04/06/2019.  Final pathology showed Gleason score 3+4 =7 disease in 25% of the prostate with negative margins, stage pT2Nx.  A bilateral nerve sparing procedure was performed   His first postop PSA on 06/15/2019 was <0.1.  PSA remains <0.1 on 12/02/2020.  We discussed the need for ongoing PSA monitoring, 1 more time in 6 months and if remains undetectable to a yearly basis.  He is not having any stress incontinence, however a few times a week he will leak a small amount of urine without realizing it.  This is not urgency or urge incontinence, or stress incontinence, and he does not necessarily have to have a full bladder.  He drinks primarily water during the day.  He is urinating with a good stream.  I recommended referral to pelvic floor physical therapy.  He is still not able to achieve erection sufficient for intercourse despite the Cialis 20 mg daily.  We discussed a trial of sildenafil 100 mg on demand, and information provided about other alternatives including vacuum erection device, penile injections, or penile prosthesis.  He is not sure that he would want to pursue any of these more aggressive treatments.  Trial of sildenafil 100 mg on demand for ED Referral placed to pelvic floor physical therapy for incontinence  RTC 6 months PSA prior   Billey Co, MD  [place] [address], [place], [place] [address] [phone]

## 2020-12-27 DIAGNOSIS — Z23 Encounter for immunization: Secondary | ICD-10-CM | POA: Diagnosis not present

## 2021-01-17 ENCOUNTER — Ambulatory Visit: Payer: Medicare Other | Admitting: Physical Therapy

## 2021-01-18 ENCOUNTER — Ambulatory Visit: Payer: Medicare Other | Attending: Urology | Admitting: Physical Therapy

## 2021-01-18 ENCOUNTER — Encounter: Payer: Self-pay | Admitting: Physical Therapy

## 2021-01-18 ENCOUNTER — Other Ambulatory Visit: Payer: Self-pay

## 2021-01-18 DIAGNOSIS — M2170 Unequal limb length (acquired), unspecified site: Secondary | ICD-10-CM | POA: Insufficient documentation

## 2021-01-18 DIAGNOSIS — R2689 Other abnormalities of gait and mobility: Secondary | ICD-10-CM | POA: Diagnosis not present

## 2021-01-18 DIAGNOSIS — M6208 Separation of muscle (nontraumatic), other site: Secondary | ICD-10-CM | POA: Insufficient documentation

## 2021-01-18 DIAGNOSIS — R278 Other lack of coordination: Secondary | ICD-10-CM | POA: Diagnosis not present

## 2021-01-18 NOTE — Patient Instructions (Addendum)
improve bladder irritant to water ratio from 15 fl oz of coffee to 16-20 fl oz of water to 15 fl oz of coffee to 30 fl oz of water in order to improve bladder health and urinary function  __ Improve R hip strength   Clam Shell 45 Degrees L sidelying   Lying with hips and knees bent 45, one pillow between knees and ankles. Heel together, toes apart like ballerina,  Lift knee with exhale while pressing heels together. Be sure pelvis does not roll backward. Do not arch back. Do 20 times, R leg only  2 times per day.      Complimentary stretch: Figure-4   On edge of bed with opp foot on the ground for also a hip flexor stretch   5 breaths   ___  Contact PCP doctor for sleep study  Read the OSA articles ( sleep apnea and nocturia)

## 2021-01-19 NOTE — Addendum Note (Signed)
Addended by: Jerl Mina on: 01/19/2021 05:23 PM   Modules accepted: Orders

## 2021-01-19 NOTE — Therapy (Addendum)
[place] [facility] SERVICES [address] [place], [place], [address] Phone: [phone]   Fax:  [phone]  Physical Therapy Evaluation  Patient Details  Name: Brian Glass MRN: [id number] Date of Birth: 1951/03/10 Referring Provider (PT): [place]    Encounter Date: 01/18/2021   PT End of Session - 01/18/21 1114     Visit Number 1    Number of Visits 10    Date for PT Re-Evaluation 03/29/21    PT Start Time 1108    PT Stop Time 1205    PT Time Calculation (min) 57 min    Activity Tolerance Patient tolerated treatment well    Behavior During Therapy [facility] for tasks assessed/performed             Past Medical History:  Diagnosis Date   Allergy    Arthritis    Atrial fibrillation ([place])    Bilateral vitreous detachment    Chickenpox    Hx of basal cell carcinoma 2018   L malar cheek   Prostate cancer ([place]) 2020   Typical atrial flutter ([place])     Past Surgical History:  Procedure Laterality Date   ATRIAL FIBRILLATION ABLATION N/A 05/10/2016   Procedure: Atrial Fibrillation Ablation;  Surgeon: [name], MD;  Location: [place] CV LAB;  Service: Cardiovascular;  Laterality: N/A;   CATARACT EXTRACTION, BILATERAL  2016   COLONOSCOPY WITH PROPOFOL N/A 10/20/2018   Procedure: COLONOSCOPY WITH PROPOFOL;  Surgeon: Lin Landsman, MD;  Location: [facility] ENDOSCOPY;  Service: Gastroenterology;  Laterality: N/A;   EYE SURGERY     MOHS SURGERY Left 2018   malar cheek, BCC   ROBOT ASSISTED LAPAROSCOPIC RADICAL PROSTATECTOMY N/A 04/06/2019   Procedure: XI ROBOTIC ASSISTED LAPAROSCOPIC RADICAL PROSTATECTOMY;  Surgeon: Billey Co, MD;  Location: [facility] ORS;  Service: Urology;  Laterality: N/A;   TONSILLECTOMY  1955    There were no vitals filed for this visit.    Subjective Assessment - 01/18/21 1116     Subjective 1) leakage: Pt had prostate surgey 2021. Pt uses 1 pad a day. Leakage occurs mid morning and 5-6pm. Pt's  activies: birding with 5-6K steps with inclines on trails, 3x week, spin class 45 min-1hr / 3 x week , 1 mile walking  with wife 4-5 x day. Nocturia: 2 x night and sometimes more. This frequency of night time voiding is the same prior to surgery.  Daily fluid intake: water 16-20 floz, coffee 15 fl oz,  Pt has been doing his kegel exercises.    2) L glut pain started 6 months ago: it takes a minute to loosen up after sit to stand,     Pertinent History Prostate Cancer with RALP surgery, MOHS surgery for skin cancer    Patient Stated Goals go without a pad                [facility] PT Assessment - 01/19/21 1506       Ambulation/Gait   Gait Comments 1.11 ms, limited R anterior rotation of pelvis, R shoulder higher, excessive sway to L of pelvis ( post Tx: 1.3 m/s reciporcal pattern)                        Objective measurements completed on examination: See above findings.     Pelvic Floor Special Questions - 01/18/21 1147     Diastasis Recti 3 fingers width along linea alba  [place] Adult PT Treatment/Exercise - 01/19/21 1506       Ambulation/Gait   Gait Comments 1.11 ms, limited R anterior rotation of pelvis, R shoulder higher, excessive sway to L of pelvis   Pst Tx 1.3 m/s with shoe lift in     Neuro Re-ed    Neuro Re-ed Details  cued for body mechanics, clam shell technique, educated on wearing shoe lift      Manual Therapy   Manual therapy comments STM/MWM at throacic curve to mobilize                     PT Long Term Goals - 01/18/21 1123       PT LONG TERM GOAL #1   Title Pt's PCP will get contacted about sleep study test given pt's risk factors of OSA ( nocturia, Afill)    Time 5    Period Weeks    Status New    Target Date 02/22/21      PT LONG TERM GOAL #2   Title Pt will decrease pad from 1 to zero at home across 1 week in order to improve QOL    Time 10    Period Weeks    Status New    Target Date 03/29/21       PT LONG TERM GOAL #3   Title Pt will demo proper pelvic coordination/ contraction 3 sec , 3 reps in seated position to improve continence    Time 8    Period Weeks    Status New    Target Date 03/15/21      PT LONG TERM GOAL #4   Title Pt will be ablet o get up from sit to stand without L gut pain after watching a movie and getting up at night to pee    Time 10    Period Weeks    Status New    Target Date 03/29/21      PT LONG TERM GOAL #5   Title Pt will improve bladder irritant to water ratio from 15 fl oz of coffee to 16-20 fl oz of water to 15 fl oz of coffee to 30 fl oz of water in order to improve bladder health and urinary function    Time 2    Period Weeks    Status New    Target Date 02/01/21      Additional Long Term Goals   Additional Long Term Goals Yes      PT LONG TERM GOAL #6   Title Pt will increase hip abduction on R from 3/5 to > 4/5 in  order to adjust for shorter R leg and to decrease L glut pain and to continue riding bike in spin class with less risk for injuries    Time 8    Period Weeks    Status New      PT LONG TERM GOAL #7   Title --    Time --    Period --    Status --    Target Date --                   - Plan - 01/19/21 1501     Clinical Impression Statement  Pt is a  70  yo  who presents with urinary leakage and L gut pain which impact his ADLs and QOL.  Pt has RALP for prostate CA last year. Pt has Hx of MOHS 2/2 skin CA. Pt has  been performing kegels.    Pt's musculoskeletal assessment revealed uneven leg length (L higher) scar adhesions by umbilicus, diastasis recti, scoliotic curves, R hip abduction weakness,  limited spinal /pelvic mobility, dyscoordination and strength of pelvic floor mm, and poor body mechanics which places strain on the abdominal/pelvic floor mm.   These are deficits that indicate an ineffective intraabdominal pressure system associated with increased risk for pt's Sx.   Pt will benefit from coordination  training and education on fitness and functional positions in order to gain a more effective intraabdominal pressure system to minimize urinary leakage.  Pt was provided education on etiology of Sx with anatomy, physiology explanation with images along with the benefits of customized pelvic PT Tx based on pt's medical conditions and musculoskeletal deficits.  Explained the physiology of deep core mm coordination and roles of pelvic floor function in urination, defecation, sexual function, and postural control with deep core mm system.   Regional interdependent approaches will yield greater benefits in pt's POC due to the Hx of R ankle/foot injuries, leg length difference, scoliotic curve.  Following Tx today which pt tolerated without complaints, pt demo'd equal alignment of pelvic girdle with shoe lift in R shoe and less hypomobile thoracic spine. Gait speed improved and pt demo'd more reciprocal pattern with shoe lift and post Tx.  Plan to assess and treat DRA and pelvic floor next session. Pt benefits from skilled PT.     Examination-Activity Limitations Continence;Toileting;Transfers    Stability/Clinical Decision Making Evolving/Moderate complexity    Clinical Decision Making Moderate    Rehab Potential Good    PT Frequency 1x / week   10   PT Duration Other (comment)   10   PT Treatment/Interventions Neuromuscular re-education;Cognitive remediation;Therapeutic exercise;Patient/family education;Manual techniques;Moist Heat;Therapeutic activities;Functional mobility training;Stair training;ADLs/Self Care Home Management;Taping;Splinting;Energy conservation;Balance training;Scar mobilization;Spinal Manipulations    Consulted and Agree with Plan of Care Patient             Patient will benefit from skilled therapeutic intervention in order to improve the following deficits and impairments:  Increased muscle spasms, Postural dysfunction, Improper body mechanics, Decreased strength, Decreased  safety awareness, Decreased endurance, Hypomobility  Visit Diagnosis: Other abnormalities of gait and mobility  Other lack of coordination  Diastasis recti  Unequal leg length     Problem List Patient Active Problem List   Diagnosis Date Noted   Thrombocytopenia ([place]) 11/10/2019   Prostate cancer ([place]) 04/06/2019   Encounter for screening colonoscopy 10/01/2018   Wart 11/28/2017   Medicare annual wellness visit, subsequent 03/23/2016   Paroxysmal atrial fibrillation ([place]) 11/29/2015   BPH (benign prostatic hyperplasia) 11/29/2015   Hyperlipidemia 03/24/2015    Jerl Mina, PT 01/19/2021, 5:22 PM  [facility] [address] [place], [place], [address] Phone: [phone]   Fax:  [phone]  Name: March Steyer MRN: [id number] Date of Birth: 12/31/1950

## 2021-02-11 ENCOUNTER — Encounter: Payer: Self-pay | Admitting: Physical Therapy

## 2021-02-12 DIAGNOSIS — J029 Acute pharyngitis, unspecified: Secondary | ICD-10-CM | POA: Diagnosis not present

## 2021-02-12 DIAGNOSIS — Z20822 Contact with and (suspected) exposure to covid-19: Secondary | ICD-10-CM | POA: Diagnosis not present

## 2021-02-12 DIAGNOSIS — M791 Myalgia, unspecified site: Secondary | ICD-10-CM | POA: Diagnosis not present

## 2021-02-13 ENCOUNTER — Ambulatory Visit: Payer: Medicare Other | Admitting: Physical Therapy

## 2021-02-20 ENCOUNTER — Encounter: Payer: Medicare Other | Admitting: Physical Therapy

## 2021-02-27 ENCOUNTER — Other Ambulatory Visit: Payer: Self-pay

## 2021-02-27 ENCOUNTER — Ambulatory Visit: Payer: Medicare Other | Attending: Urology | Admitting: Physical Therapy

## 2021-02-27 DIAGNOSIS — R2689 Other abnormalities of gait and mobility: Secondary | ICD-10-CM | POA: Diagnosis not present

## 2021-02-27 DIAGNOSIS — R278 Other lack of coordination: Secondary | ICD-10-CM | POA: Insufficient documentation

## 2021-02-27 DIAGNOSIS — M533 Sacrococcygeal disorders, not elsewhere classified: Secondary | ICD-10-CM | POA: Insufficient documentation

## 2021-02-27 DIAGNOSIS — M6208 Separation of muscle (nontraumatic), other site: Secondary | ICD-10-CM | POA: Insufficient documentation

## 2021-02-27 DIAGNOSIS — M2170 Unequal limb length (acquired), unspecified site: Secondary | ICD-10-CM | POA: Diagnosis not present

## 2021-02-27 NOTE — Patient Instructions (Signed)
°  Deep core level 1-2 ( handout) ° ° ° ° °

## 2021-02-27 NOTE — Therapy (Signed)
[place] MAIN [facility] SERVICES [address] [place], [place], [address] Phone: [phone]   Fax:  [phone]  Physical Therapy Treatment  Patient Details  Name: Brian Glass MRN: [id number] Date of Birth: 26-Nov-1950 Referring Provider (PT): Sninisky    Encounter Date: 02/27/2021   PT End of Session - 02/27/21 1611     Visit Number 2    Number of Visits 10    Date for PT Re-Evaluation 03/29/21    PT Start Time 4401    PT Stop Time 1700    PT Time Calculation (min) 54 min    Activity Tolerance Patient tolerated treatment well    Behavior During Therapy [facility] for tasks assessed/performed             Past Medical History:  Diagnosis Date   Allergy    Arthritis    Atrial fibrillation ([place])    Bilateral vitreous detachment    Chickenpox    Hx of basal cell carcinoma 2018   L malar cheek   Prostate cancer ([place]) 2020   Typical atrial flutter ([place])     Past Surgical History:  Procedure Laterality Date   ATRIAL FIBRILLATION ABLATION N/A 05/10/2016   Procedure: Atrial Fibrillation Ablation;  Surgeon: [name], MD;  Location: [place] CV LAB;  Service: Cardiovascular;  Laterality: N/A;   CATARACT EXTRACTION, BILATERAL  2016   COLONOSCOPY WITH PROPOFOL N/A 10/20/2018   Procedure: COLONOSCOPY WITH PROPOFOL;  Surgeon: Lin Landsman, MD;  Location: [facility] ENDOSCOPY;  Service: Gastroenterology;  Laterality: N/A;   EYE SURGERY     MOHS SURGERY Left 2018   malar cheek, BCC   ROBOT ASSISTED LAPAROSCOPIC RADICAL PROSTATECTOMY N/A 04/06/2019   Procedure: XI ROBOTIC ASSISTED LAPAROSCOPIC RADICAL PROSTATECTOMY;  Surgeon: Billey Co, MD;  Location: [facility] ORS;  Service: Urology;  Laterality: N/A;   TONSILLECTOMY  1955    There were no vitals filed for this visit.   Subjective Assessment - 02/27/21 1610     Subjective Pt used to leak 5 days per week and since the his first appt 1.5 months, pt had no change to leakage. Pt  wearshis shoe lift in his R shoe.    Pertinent History Prostate Cancer with RALP surgery, MOHS surgery for skin cancer    Patient Stated Goals go without a pad                [facility] PT Assessment - 02/27/21 1614       Observation/Other Assessments   Scoliosis R thoracic. L lumbar concave curve, B iliac crest levelled with R shoe lift      Coordination   Fine Motor Movements are Fluid and Coordinated --   limited diaphramgatic excursion     Palpation   SI assessment  standing B iliac crest levelled, supine, L iliac crest higher    Palpation comment tightness along L parspinals, intercostals, medial scapula L                        Pelvic Floor Special Questions - 02/27/21 1732     Diastasis Recti no separation               [facility] Adult PT Treatment/Exercise - 02/27/21 1614       Ambulation/Gait   Pre-Gait Activities --    Gait Comments 1.36 m/s, reciporcal gait      Neuro Re-ed    Neuro Re-ed Details  cued for deep core  1-2   , open book for thoracic  mobility      Manual Therapy   Manual therapy comments STM/MWM at probelm areas noted in assessment to promote less deviation of spine                       PT Short Term Goals - 02/27/21 1809       PT SHORT TERM GOAL #1   Title pt will demo correct technique with deep core levle 1 and 2 in order to increase intraabdominal pressure for optimal lengtehning of pelvic floor to minimize delayed urination and minimizing incontinence    Time 1    Period Weeks    Status On-going      PT SHORT TERM GOAL #2   Title Pt will demo decreased 3 to < 1  fingers width along linea alba in order to strengtehn abdominal pelvic tensigrity for better outcomes with kegel strengthening and increase postural stability.    Time 1    Period Days    Status Achieved      PT SHORT TERM GOAL #3   Title Pt will demo proper body mechanics to minimize straining abdominopelvic area and optimize pelvic floor     Time 1    Period Weeks    Status On-going               PT Long Term Goals - 02/27/21 1810       PT LONG TERM GOAL #1   Title Pt's PCP will get contacted about sleep study test given pt's risk factors of OSA ( nocturia, Afill)    Time 5    Period Weeks    Status On-going    Target Date 02/22/21      PT LONG TERM GOAL #2   Title Pt will decrease pad from 1 to zero at home across 1 week in order to improve QOL    Time 10    Period Weeks    Status On-going    Target Date 03/29/21      PT LONG TERM GOAL #3   Title Pt will demo proper pelvic coordination/ contraction 3 sec , 3 reps in seated position to improve continence    Time 8    Period Weeks    Status On-going    Target Date 03/15/21      PT LONG TERM GOAL #4   Title Pt will be ablet o get up from sit to stand without L gut pain after watching a movie and getting up at night to pee    Time 10    Period Weeks    Status On-going    Target Date 03/29/21      PT LONG TERM GOAL #5   Title Pt will improve bladder irritant to water ratio from 15 fl oz of coffee to 16-20 fl oz of water to 15 fl oz of coffee to 30 fl oz of water in order to improve bladder health and urinary function    Time 2    Period Weeks    Status On-going    Target Date 02/01/21      PT LONG TERM GOAL #6   Title Pt will increase hip abduction on R from 3/5 to > 4/5 in  order to adjust for shorter R leg and to decrease L glut pain and to continue riding bike in spin class with less risk for injuries    Time 8  Period Weeks    Status On-going                   Plan - 02/27/21 1612     Clinical Impression Statement Pt returned after 1st visit from 1.5 months ago. Pt demo'd equal pelvic alignment with compliance to shoe lift in R shoe and resolution of diastasis recti  which indicate shorter leg difference that needed to be addressed to improve IAP system and resolution of DRA,. Pt required further manual Tx to address T/L  junction  where scoliotic curve was present. Pt demo'd improved diaphragmatic excursion and progressed to deep core HEP. Plan to address abdominal area at next session and assess pelvic floor at upcoming sessions. Pt continues to benefit from skilled PT.   Examination-Activity Limitations Continence;Toileting;Transfers    Stability/Clinical Decision Making Evolving/Moderate complexity    Rehab Potential Good    PT Frequency 1x / week   10   PT Duration Other (comment)   10   PT Treatment/Interventions Neuromuscular re-education;Cognitive remediation;Therapeutic exercise;Patient/family education;Manual techniques;Moist Heat;Therapeutic activities;Functional mobility training;Stair training;ADLs/Self Care Home Management;Taping;Splinting;Energy conservation;Balance training;Scar mobilization;Spinal Manipulations    Consulted and Agree with Plan of Care Patient             Patient will benefit from skilled therapeutic intervention in order to improve the following deficits and impairments:  Increased muscle spasms, Postural dysfunction, Improper body mechanics, Decreased strength, Decreased safety awareness, Decreased endurance, Hypomobility  Visit Diagnosis: Sacrococcygeal disorders, not elsewhere classified     Problem List Patient Active Problem List   Diagnosis Date Noted   Thrombocytopenia ([place]) 11/10/2019   Prostate cancer ([place]) 04/06/2019   Encounter for screening colonoscopy 10/01/2018   Wart 11/28/2017   Medicare annual wellness visit, subsequent 03/23/2016   Paroxysmal atrial fibrillation ([place]) 11/29/2015   BPH (benign prostatic hyperplasia) 11/29/2015   Hyperlipidemia 03/24/2015    Jerl Mina, PT 02/27/2021, 6:21 PM  [place] MAIN [facility] SERVICES [place], [place], [address] Phone: [phone]   Fax:  [phone]  Name: Brian Glass MRN: [id number] Date of Birth: 04-16-1950

## 2021-03-06 ENCOUNTER — Other Ambulatory Visit: Payer: Self-pay

## 2021-03-06 ENCOUNTER — Ambulatory Visit: Payer: Medicare Other | Admitting: Physical Therapy

## 2021-03-06 DIAGNOSIS — R2689 Other abnormalities of gait and mobility: Secondary | ICD-10-CM

## 2021-03-06 DIAGNOSIS — M2170 Unequal limb length (acquired), unspecified site: Secondary | ICD-10-CM

## 2021-03-06 DIAGNOSIS — M533 Sacrococcygeal disorders, not elsewhere classified: Secondary | ICD-10-CM | POA: Diagnosis not present

## 2021-03-06 DIAGNOSIS — R278 Other lack of coordination: Secondary | ICD-10-CM

## 2021-03-06 DIAGNOSIS — M6208 Separation of muscle (nontraumatic), other site: Secondary | ICD-10-CM | POA: Diagnosis not present

## 2021-03-06 NOTE — Patient Instructions (Addendum)
Refined deep core level 1-2  Lengthened breath,  Stability points   __    Midback stretch:   Angel wings 10 reps    ___

## 2021-03-06 NOTE — Therapy (Signed)
[place] MAIN [facility] SERVICES [address] [place], [place], [address] Phone: [phone]   Fax:  [phone]  Physical Therapy Treatment  Patient Details  Name: Brian Glass MRN: [id number] Date of Birth: [birth year] Referring Provider (PT): Sninisky    Encounter Date: 03/06/2021   PT End of Session - 03/06/21 1600     Visit Number 3    Number of Visits 10    Date for PT Re-Evaluation 03/29/21    PT Start Time 1500    PT Stop Time 1600    PT Time Calculation (min) 60 min    Activity Tolerance Patient tolerated treatment well    Behavior During Therapy [facility] for tasks assessed/performed             Past Medical History:  Diagnosis Date   Allergy    Arthritis    Atrial fibrillation ([place])    Bilateral vitreous detachment    Chickenpox    Hx of basal cell carcinoma 2018   L malar cheek   Prostate cancer ([place]) 2020   Typical atrial flutter ([place])     Past Surgical History:  Procedure Laterality Date   ATRIAL FIBRILLATION ABLATION N/A 05/10/2016   Procedure: Atrial Fibrillation Ablation;  Surgeon: [name], MD;  Location: [place] CV LAB;  Service: Cardiovascular;  Laterality: N/A;   CATARACT EXTRACTION, BILATERAL  2016   COLONOSCOPY WITH PROPOFOL N/A 10/20/2018   Procedure: COLONOSCOPY WITH PROPOFOL;  Surgeon: Lin Landsman, MD;  Location: [facility] ENDOSCOPY;  Service: Gastroenterology;  Laterality: N/A;   EYE SURGERY     MOHS SURGERY Left 2018   malar cheek, BCC   ROBOT ASSISTED LAPAROSCOPIC RADICAL PROSTATECTOMY N/A 04/06/2019   Procedure: XI ROBOTIC ASSISTED LAPAROSCOPIC RADICAL PROSTATECTOMY;  Surgeon: Billey Co, MD;  Location: [facility] ORS;  Service: Urology;  Laterality: N/A;   TONSILLECTOMY  1955    There were no vitals filed for this visit.   Subjective Assessment - 03/06/21 1509     Subjective Pt reported his midback hurt after after springing up from waking up by an alarm and eventually it went away     Pertinent History Prostate Cancer with RALP surgery, MOHS surgery for skin cancer    Patient Stated Goals go without a pad                [facility] PT Assessment - 03/06/21 1513       Observation/Other Assessments   Scoliosis R shoulder higher, tightness along low trap      Palpation   Palpation comment hypomobile / tenderness at T7, intercostals bilaterally T6-7                           [facility] Adult PT Treatment/Exercise - 03/06/21 1542       Neuro Re-ed    Neuro Re-ed Details  cued for less abdominal expansion with de epcore, more stability with deep core  , added angel wings      Modalities   Modalities Moist Heat      Moist Heat Therapy   Number Minutes Moist Heat 5 Minutes    Moist Heat Location --   throacic, legs propped , unbilled     Manual Therapy   Manual therapy comments STM/MWM at problem areas noted in assessment to promote diaphragm excursion, to correct higher R shoulder height  PT Short Term Goals - 02/27/21 1809       PT SHORT TERM GOAL #1   Title pt will demo correct technique with deep core levle 1 and 2 in order to increase intraabdominal pressure for optimal lengtehning of pelvic floor to minimize delayed urination and minimizing incontinence    Time 1    Period Weeks    Status On-going      PT SHORT TERM GOAL #2   Title Pt will demo decreased 3 to < 1  fingers width along linea alba in order to strengtehn abdominal pelvic tensigrity for better outcomes with kegel strengthening and increase postural stability.    Time 1    Period Days    Status Achieved      PT SHORT TERM GOAL #3   Title Pt will demo proper body mechanics to minimize straining abdominopelvic area and optimize pelvic floor    Time 1    Period Weeks    Status On-going               PT Long Term Goals - 02/27/21 1810       PT LONG TERM GOAL #1   Title Pt's PCP will get contacted about sleep study test given pt's  risk factors of OSA ( nocturia, Afill)    Time 5    Period Weeks    Status On-going    Target Date 02/22/21      PT LONG TERM GOAL #2   Title Pt will decrease pad from 1 to zero at home across 1 week in order to improve QOL    Time 10    Period Weeks    Status On-going    Target Date 03/29/21      PT LONG TERM GOAL #3   Title Pt will demo proper pelvic coordination/ contraction 3 sec , 3 reps in seated position to improve continence    Time 8    Period Weeks    Status On-going    Target Date 03/15/21      PT LONG TERM GOAL #4   Title Pt will be ablet o get up from sit to stand without L gut pain after watching a movie and getting up at night to pee    Time 10    Period Weeks    Status On-going    Target Date 03/29/21      PT LONG TERM GOAL #5   Title Pt will improve bladder irritant to water ratio from 15 fl oz of coffee to 16-20 fl oz of water to 15 fl oz of coffee to 30 fl oz of water in order to improve bladder health and urinary function    Time 2    Period Weeks    Status On-going    Target Date 02/01/21      PT LONG TERM GOAL #6   Title Pt will increase hip abduction on R from 3/5 to > 4/5 in  order to adjust for shorter R leg and to decrease L glut pain and to continue riding bike in spin class with less risk for injuries    Time 8    Period Weeks    Status On-going                   Plan - 03/06/21 1600     Clinical Impression Statement Pt continued to require manual Tx to minimize hypomobility of thoracic spine and intercostals related to slightly elevated R  shoulder  . Post Tx, pt demo'd improved mobility. Pt required cues to perform deep core technique. Pt continues to benefit from skilled PT. Plan to address pelvic floor next session.     Examination-Activity Limitations Continence;Toileting;Transfers    Stability/Clinical Decision Making Evolving/Moderate complexity    Rehab Potential Good    PT Frequency 1x / week   10   PT Duration Other  (comment)   10   PT Treatment/Interventions Neuromuscular re-education;Cognitive remediation;Therapeutic exercise;Patient/family education;Manual techniques;Moist Heat;Therapeutic activities;Functional mobility training;Stair training;ADLs/Self Care Home Management;Taping;Splinting;Energy conservation;Balance training;Scar mobilization;Spinal Manipulations    Consulted and Agree with Plan of Care Patient             Patient will benefit from skilled therapeutic intervention in order to improve the following deficits and impairments:  Increased muscle spasms, Postural dysfunction, Improper body mechanics, Decreased strength, Decreased safety awareness, Decreased endurance, Hypomobility  Visit Diagnosis: Sacrococcygeal disorders, not elsewhere classified  Other abnormalities of gait and mobility  Other lack of coordination  Diastasis recti  Unequal leg length     Problem List Patient Active Problem List   Diagnosis Date Noted   Thrombocytopenia ([place]) 11/10/2019   Prostate cancer ([place]) 04/06/2019   Encounter for screening colonoscopy 10/01/2018   Wart 11/28/2017   Medicare annual wellness visit, subsequent 03/23/2016   Paroxysmal atrial fibrillation ([place]) 11/29/2015   BPH (benign prostatic hyperplasia) 11/29/2015   Hyperlipidemia 03/24/2015    Jerl Mina, PT 03/06/2021, 4:46 PM  [place] MAIN [facility] SERVICES [place], [place], [address] Phone: [phone]   Fax:  [phone]  Name: Tim Corriher MRN: [id number] Date of Birth: Sep 02, 1950

## 2021-03-15 ENCOUNTER — Other Ambulatory Visit: Payer: Self-pay

## 2021-03-15 ENCOUNTER — Ambulatory Visit: Payer: Medicare Other | Admitting: Physical Therapy

## 2021-03-15 DIAGNOSIS — R278 Other lack of coordination: Secondary | ICD-10-CM | POA: Diagnosis not present

## 2021-03-15 DIAGNOSIS — M533 Sacrococcygeal disorders, not elsewhere classified: Secondary | ICD-10-CM | POA: Diagnosis not present

## 2021-03-15 DIAGNOSIS — M6208 Separation of muscle (nontraumatic), other site: Secondary | ICD-10-CM

## 2021-03-15 DIAGNOSIS — M2170 Unequal limb length (acquired), unspecified site: Secondary | ICD-10-CM | POA: Diagnosis not present

## 2021-03-15 DIAGNOSIS — R2689 Other abnormalities of gait and mobility: Secondary | ICD-10-CM

## 2021-03-15 NOTE — Therapy (Signed)
[place] MAIN [facility] SERVICES [address] [place], [place], [address] Phone: [phone]   Fax:  [phone]  Physical Therapy Treatment  Patient Details  Name: Brian Glass MRN: [id number] Date of Birth: January 19, 1951 Referring Provider (PT): Sninisky    Encounter Date: 03/15/2021   PT End of Session - 03/15/21 1224     Visit Number 4    Number of Visits 10    Date for PT Re-Evaluation 03/29/21    PT Start Time 1220    PT Stop Time 1300    PT Time Calculation (min) 40 min    Activity Tolerance Patient tolerated treatment well    Behavior During Therapy [facility] for tasks assessed/performed             Past Medical History:  Diagnosis Date   Allergy    Arthritis    Atrial fibrillation ([place])    Bilateral vitreous detachment    Chickenpox    Hx of basal cell carcinoma 2018   L malar cheek   Prostate cancer ([place]) 2020   Typical atrial flutter ([place])     Past Surgical History:  Procedure Laterality Date   ATRIAL FIBRILLATION ABLATION N/A 05/10/2016   Procedure: Atrial Fibrillation Ablation;  Surgeon: [name], MD;  Location: [place] CV LAB;  Service: Cardiovascular;  Laterality: N/A;   CATARACT EXTRACTION, BILATERAL  2016   COLONOSCOPY WITH PROPOFOL N/A 10/20/2018   Procedure: COLONOSCOPY WITH PROPOFOL;  Surgeon: Lin Landsman, MD;  Location: [facility] ENDOSCOPY;  Service: Gastroenterology;  Laterality: N/A;   EYE SURGERY     MOHS SURGERY Left 2018   malar cheek, BCC   ROBOT ASSISTED LAPAROSCOPIC RADICAL PROSTATECTOMY N/A 04/06/2019   Procedure: XI ROBOTIC ASSISTED LAPAROSCOPIC RADICAL PROSTATECTOMY;  Surgeon: Billey Co, MD;  Location: [facility] ORS;  Service: Urology;  Laterality: N/A;   TONSILLECTOMY  1955    There were no vitals filed for this visit.   Subjective Assessment - 03/15/21 1222     Subjective Pt reported feeling sore after last session. Pt notices some days he notices he leaks and some days he notices  no leakage for 3-4 days out of 7.    Pertinent History Prostate Cancer with RALP surgery, MOHS surgery for skin cancer    Patient Stated Goals go without a pad                [facility] PT Assessment - 03/15/21 1359       Coordination   Coordination and Movement Description seated breathing with proepr technique, diaphragmatic excursion                        Pelvic Floor Special Questions - 03/15/21 1400     External Perineal Exam through clothing:hooklying: 3 reps, 3 reps, seated: 5 reps, cued for less posterior activation               [facility] Adult PT Treatment/Exercise - 03/15/21 1401       Therapeutic Activites    Other Therapeutic Activities explained pelvic floor program and systematic way to practice, explained resistance band exercise for co-activation of deep core and balance      Neuro Re-ed    Neuro Re-ed Details  cued for pelvic floor training long holds and short contractions, cued for proper eccentric control of lower kienticchain and propioception ofCOM over front leg in backwack walking      Modalities   Modalities --  Moist Heat Therapy   Moist Heat Location --      Manual Therapy   Manual therapy comments --                       PT Short Term Goals - 02/27/21 1809       PT SHORT TERM GOAL #1   Title pt will demo correct technique with deep core levle 1 and 2 in order to increase intraabdominal pressure for optimal lengtehning of pelvic floor to minimize delayed urination and minimizing incontinence    Time 1    Period Weeks    Status On-going      PT SHORT TERM GOAL #2   Title Pt will demo decreased 3 to < 1  fingers width along linea alba in order to strengtehn abdominal pelvic tensigrity for better outcomes with kegel strengthening and increase postural stability.    Time 1    Period Days    Status Achieved      PT SHORT TERM GOAL #3   Title Pt will demo proper body mechanics to minimize straining  abdominopelvic area and optimize pelvic floor    Time 1    Period Weeks    Status On-going               PT Long Term Goals - 03/15/21 1230       PT LONG TERM GOAL #1   Title Pt's PCP will get contacted about sleep study test given pt's risk factors of OSA ( nocturia, Afill)    Time 5    Period Weeks    Status On-going    Target Date 02/22/21      PT LONG TERM GOAL #2   Title Pt will decrease pad from 1 to zero at home across 1 week in order to improve QOL    Time 10    Period Weeks    Status On-going    Target Date 03/29/21      PT LONG TERM GOAL #3   Title Pt will demo proper pelvic coordination/ contraction 3 sec , 3 reps in seated position to improve continence    Time 8    Period Weeks    Status On-going    Target Date 03/15/21      PT LONG TERM GOAL #4   Title Pt will be ablet o get up from sit to stand without L gut pain after watching a movie and getting up at night to pee    Time 10    Period Weeks    Status Achieved    Target Date 03/29/21      PT LONG TERM GOAL #5   Title Pt will improve bladder irritant to water ratio from 15 fl oz of coffee to 16-20 fl oz of water to 15 fl oz of coffee to 30 fl oz of water in order to improve bladder health and urinary function    Time 2    Period Weeks    Status Achieved    Target Date 02/01/21      PT LONG TERM GOAL #6   Title Pt will increase hip abduction on R from 3/5 to > 4/5 in  order to adjust for shorter R leg and to decrease L glut pain and to continue riding bike in spin class with less risk for injuries  (03/15/21: 5/5)    Time 8    Period Weeks    Status Achieved  Plan - 03/15/21 1225     Clinical Impression Statement Pt is making improvements with report of  continence across 3-4 days per week when he used to experience leakage daily.   Advanced pt to pelvic floor strengthening. Pt required cues for more anterior activation of pelvic floor with quick contractions in  seated position. Pt showed low endurance with 3 sec, 3 reps in hooklying position. Added resistance band training for propiopception of pelvis/ lower kinetic chain. Pt demo'd proper form after cues. Pt benefits from skilled PT     Examination-Activity Limitations Continence;Toileting;Transfers    Stability/Clinical Decision Making Evolving/Moderate complexity    Rehab Potential Good    PT Frequency 1x / week   10   PT Duration Other (comment)   10   PT Treatment/Interventions Neuromuscular re-education;Cognitive remediation;Therapeutic exercise;Patient/family education;Manual techniques;Moist Heat;Therapeutic activities;Functional mobility training;Stair training;ADLs/Self Care Home Management;Taping;Splinting;Energy conservation;Balance training;Scar mobilization;Spinal Manipulations    Consulted and Agree with Plan of Care Patient             Patient will benefit from skilled therapeutic intervention in order to improve the following deficits and impairments:  Increased muscle spasms, Postural dysfunction, Improper body mechanics, Decreased strength, Decreased safety awareness, Decreased endurance, Hypomobility  Visit Diagnosis: Other abnormalities of gait and mobility  Other lack of coordination  Diastasis recti  Unequal leg length     Problem List Patient Active Problem List   Diagnosis Date Noted   Thrombocytopenia ([place]) 11/10/2019   Prostate cancer ([place]) 04/06/2019   Encounter for screening colonoscopy 10/01/2018   Wart 11/28/2017   Medicare annual wellness visit, subsequent 03/23/2016   Paroxysmal atrial fibrillation ([place]) 11/29/2015   BPH (benign prostatic hyperplasia) 11/29/2015   Hyperlipidemia 03/24/2015    Jerl Mina, PT 03/15/2021, 2:06 PM  [place] MAIN [facility] SERVICES [address] [place], [place], [address] Phone: [phone]   Fax:  [phone]  Name: Juventino Pavone MRN: [id number] Date of Birth:  1951-01-04

## 2021-03-15 NOTE — Patient Instructions (Signed)
PELVIC FLOOR / KEGEL EXERCISES   Pelvic floor/ Kegel exercises are used to strengthen the muscles in the base of your pelvis that are responsible for supporting your pelvic organs and preventing urine/feces leakage. Based on your therapists recommendations, they can be performed while standing, sitting, or lying down.  Make yourself aware of this muscle group by using these cues: Imagine you are in a crowded room and you feel the need to pass gas. Your response is to pull up and in at the rectum. Close the rectum. Pull the muscles up inside your body,feeling your scrotum lifting as well . Feel the pelvic floor muscles lift as if you were walking into a cold lake. Place your hand on top of your pubic bone. Tighten and draw in the muscles around the anal muscles without squeezing the buttock muscles.  Common Errors: Breath holding: If you are holding your breath, you may be bearing down against your bladder instead of pulling it up. If you belly bulges up while you are squeezing, you are holding your breath. Be sure to breathe gently in and out while exercising. Counting out loud may help you avoid holding your breath. Accessory muscle use: You should not see or feel other muscle movement when performing pelvic floor exercises. When done properly, no one can tell that you are performing the exercises. Keep the buttocks, belly and inner thighs relaxed. Overdoing it: Your muscles can fatigue and stop working for you if you over-exercise. You may actually leak more or feel soreness at the lower abdomen or rectum.  YOUR HOME EXERCISE PROGRAM  LONG HOLDS: Position: on back Inhale and then exhale. Then squeeze the muscle and count aloud for 3 seconds. Rest with three long breaths. (Be sure to let belly sink in with exhales and not push outward) Perform 3  repetitions, _4__  different times/day  SHORT HOLDS: Position: on sitting with more anterior tilt of pelvis to activate front pelvic floor muscles   Inhale and then exhale. Then squeeze the muscle.  (Be sure to let belly sink in with exhales and not push outward) Perform 5 repetitions, 5  Times/day  **ALSO SQUEEZE BEFORE YOUR SNEEZE, COUGH, LAUGH to decrease downward pressure   **ALSO EXHALE BEFORE YOU RISE AGAINST GRAVITY (lifting, sit to stand, from squat to stand)   ___    WALKING WITH RESISTANCE BLUE Band at waist connected to doorknob 38mins Stepping forward normal length steps, planting mid and forefoot down, center of mass ( navel) leans forward slightly as if you were walking uphill 3-4 steps till band feels taut ( MAKE SURE THE DOOR IS LOCKED AND WON'T OPEN)   Stepping backwards, lower heel slowly, carry trunk and hips back , leaning forward, front knee along 2-3 rd toe line    2 min

## 2021-03-17 ENCOUNTER — Encounter: Payer: Medicare Other | Admitting: Primary Care

## 2021-03-21 ENCOUNTER — Other Ambulatory Visit: Payer: Self-pay | Admitting: Primary Care

## 2021-03-21 ENCOUNTER — Ambulatory Visit: Payer: Medicare Other | Admitting: Physical Therapy

## 2021-03-21 DIAGNOSIS — E785 Hyperlipidemia, unspecified: Secondary | ICD-10-CM

## 2021-03-22 ENCOUNTER — Ambulatory Visit (INDEPENDENT_AMBULATORY_CARE_PROVIDER_SITE_OTHER): Payer: Medicare Other | Admitting: Primary Care

## 2021-03-22 ENCOUNTER — Other Ambulatory Visit: Payer: Self-pay

## 2021-03-22 ENCOUNTER — Encounter: Payer: Self-pay | Admitting: Primary Care

## 2021-03-22 DIAGNOSIS — Z9079 Acquired absence of other genital organ(s): Secondary | ICD-10-CM

## 2021-03-22 DIAGNOSIS — D696 Thrombocytopenia, unspecified: Secondary | ICD-10-CM

## 2021-03-22 DIAGNOSIS — Z8546 Personal history of malignant neoplasm of prostate: Secondary | ICD-10-CM | POA: Diagnosis not present

## 2021-03-22 DIAGNOSIS — E785 Hyperlipidemia, unspecified: Secondary | ICD-10-CM

## 2021-03-22 NOTE — Assessment & Plan Note (Signed)
Compliant to rosuvastatin 5 mg, repeat lipid panel pending. Needs refills.

## 2021-03-22 NOTE — Progress Notes (Signed)
Subjective:    Patient ID: Brian Glass, male    DOB: 1950-10-09, 71 y.o.   MRN: [id number]  HPI  Brian Glass is a very pleasant 71 y.o. male with a history of paroxysmal atrial fibrillation, BPH, prostate cancer, thrombocytopenia, hyperlipidemia who presents today for follow up of chronic conditions.  Paroxysmal atrial fibrillation history, no issues in years. Is not on beta blocker, CCB, or anticoagulation.   Follows with urology once annually who monitors PSA and prostate cancer history. History of prostatectomy. Is now following with PT for urinary incontinence. Sildenafil was ineffective for ED. Also continues with chronic nocturia. Denies snoring, denies periods of apnea per his wife. He drinks coffee in the AM, no large amounts of liquid prior to bed.   Immunizations: -Tetanus: 2014 -Influenza: completed this season  -Covid-19: 3 vaccines  -Shingles: Completed Zostavax and first dose of Shingrix -Pneumonia: Prevar 13 2018, pneumovax 2013  Colonoscopy: Completed in 2020, due 2027 PSA: <0.1 in September 2022    BP Readings from Last 3 Encounters:  03/22/21 104/60  12/15/20 108/61  11/10/20 100/60        Review of Systems  Constitutional:  Negative for unexpected weight change.  HENT:  Negative for rhinorrhea.   Respiratory:  Negative for cough and shortness of breath.   Cardiovascular:  Negative for chest pain.  Gastrointestinal:  Negative for constipation and diarrhea.  Genitourinary:  Negative for difficulty urinating.       Nocturia   Musculoskeletal:  Negative for arthralgias and myalgias.  Skin:  Negative for rash.  Allergic/Immunologic: Negative for environmental allergies.  Neurological:  Negative for dizziness and headaches.  Psychiatric/Behavioral:  The patient is not nervous/anxious.         Past Medical History:  Diagnosis Date   Allergy    Arthritis    Atrial fibrillation (HCC)    Bilateral vitreous detachment    Chickenpox    Hx of  basal cell carcinoma 2018   L malar cheek   Prostate cancer ([place]) 2020   Typical atrial flutter (HCC)     Social History   Socioeconomic History   Marital status: Married    Spouse name: Dorian Pod   Number of children: Not on file   Years of education: Not on file   Highest education level: Not on file  Occupational History   Not on file  Tobacco Use   Smoking status: Never   Smokeless tobacco: Former    Types: Chew    Quit date: 1970  Vaping Use   Vaping Use: Never used  Substance and Sexual Activity   Alcohol use: Yes    Alcohol/week: 2.0 standard drinks    Types: 2 Cans of beer per week    Comment: social   Drug use: No   Sexual activity: Yes    Birth control/protection: None  Other Topics Concern   Not on file  Social History Narrative   Married.   3 children. 2 grandchildren.   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   Social Determinants of Health   Financial Resource Strain: Not on file  Food Insecurity: Not on file  Transportation Needs: Not on file  Physical Activity: Not on file  Stress: Not on file  Social Connections: Not on file  Intimate Partner Violence: Not on file    Past Surgical History:  Procedure Laterality Date   ATRIAL FIBRILLATION ABLATION N/A 05/10/2016   Procedure: Atrial Fibrillation Ablation;  Surgeon: [name], MD;  Location: [place] [facility];  Service: Cardiovascular;  Laterality: N/A;   CATARACT EXTRACTION, BILATERAL  2016   COLONOSCOPY WITH PROPOFOL N/A 10/20/2018   Procedure: COLONOSCOPY WITH PROPOFOL;  Surgeon: Lin Landsman, MD;  Location: [facility] ENDOSCOPY;  Service: Gastroenterology;  Laterality: N/A;   EYE SURGERY     MOHS SURGERY Left 2018   malar cheek, BCC   ROBOT ASSISTED LAPAROSCOPIC RADICAL PROSTATECTOMY N/A 04/06/2019   Procedure: XI ROBOTIC ASSISTED LAPAROSCOPIC RADICAL PROSTATECTOMY;  Surgeon: Billey Co, MD;  Location: [facility] ORS;  Service: Urology;  Laterality: N/A;   TONSILLECTOMY   1955    Family History  Problem Relation Age of Onset   Hyperlipidemia Mother    Heart disease Mother    Hypertension Mother    Hyperlipidemia Father    Heart disease Father    Stroke Father    Hypertension Father    Heart attack Father    Prostate cancer Brother    Atrial fibrillation Brother    Sleep apnea Brother    CAD Brother     No Known Allergies  Current Outpatient Medications on File Prior to Visit  Medication Sig Dispense Refill   Calcium Polycarbophil (EQ FIBER LAXATIVE PO) Take 1 capsule by mouth daily.      fluticasone (FLONASE) 50 MCG/ACT nasal spray Place 1 spray into both nostrils daily as needed for allergies.      Multiple Vitamins-Minerals (SENIOR MULTIVITAMIN PLUS PO) Take 1 tablet by mouth daily.     olopatadine (PATANOL) 0.1 % ophthalmic solution Place 1 drop into both eyes 2 (two) times daily as needed for allergies.      Omega-3 Fatty Acids (FISH OIL PO) Take 1,300 mg by mouth daily.     rosuvastatin (CRESTOR) 5 MG tablet Take 1 tablet (5 mg total) by mouth every evening. For cholesterol. 90 tablet 3   sildenafil (VIAGRA) 100 MG tablet Take 1 tablet (100 mg total) by mouth daily as needed for erectile dysfunction. (Patient not taking: Reported on 03/22/2021) 30 tablet 6   No current facility-administered medications on file prior to visit.    BP 104/60    Pulse 67    Temp 98.3 F (36.8 C) (Temporal)    Ht 5\' 10"  (1.778 m)    Wt 170 lb (77.1 kg)    SpO2 99%    BMI 24.39 kg/m  Objective:   Physical Exam HENT:     Right Ear: Tympanic membrane and ear canal normal.     Left Ear: Tympanic membrane and ear canal normal.     Nose: Nose normal.     Right Sinus: No maxillary sinus tenderness or frontal sinus tenderness.     Left Sinus: No maxillary sinus tenderness or frontal sinus tenderness.  Eyes:     Conjunctiva/sclera: Conjunctivae normal.  Neck:     Thyroid: No thyromegaly.     Vascular: No carotid bruit.  Cardiovascular:     Rate and Rhythm:  Normal rate and regular rhythm.     Heart sounds: Normal heart sounds.  Pulmonary:     Effort: Pulmonary effort is normal.     Breath sounds: Normal breath sounds. No wheezing or rales.  Abdominal:     General: Bowel sounds are normal.     Palpations: Abdomen is soft.     Tenderness: There is no abdominal tenderness.  Musculoskeletal:        General: Normal range of motion.     Cervical back: Neck supple.  Skin:  General: Skin is warm and dry.  Neurological:     Mental Status: He is alert and oriented to person, place, and time.     Cranial Nerves: No cranial nerve deficit.     Deep Tendon Reflexes: Reflexes are normal and symmetric.  Psychiatric:        Mood and Affect: Mood normal.          Assessment & Plan:      This visit occurred during the SARS-CoV-2 public health emergency.  Safety protocols were in place, including screening questions prior to the visit, additional usage of staff PPE, and extensive cleaning of exam room while observing appropriate contact time as indicated for disinfecting solutions.

## 2021-03-22 NOTE — Patient Instructions (Signed)
Stop by the lab prior to leaving today. I will notify you of your results once received.   Please talk with your Urologist regarding frequent urination at night.  It was a pleasure to see you today!

## 2021-03-22 NOTE — Assessment & Plan Note (Signed)
Symptoms of nocturia, offered sleep study to rule out sleep apnea but he kindly declines.  Recommended he discuss symptoms with Urology, he will do so.

## 2021-03-22 NOTE — Assessment & Plan Note (Addendum)
Follows with Urology who monitors. Reviewed PSA from September 2022 in [place].

## 2021-03-22 NOTE — Assessment & Plan Note (Signed)
Repeat CBC pending. 

## 2021-03-23 LAB — COMPREHENSIVE METABOLIC PANEL
ALT: 23 U/L (ref 0–53)
AST: 24 U/L (ref 0–37)
Albumin: 3.9 g/dL (ref 3.5–5.2)
Alkaline Phosphatase: 73 U/L (ref 39–117)
BUN: 20 mg/dL (ref 6–23)
CO2: 30 mEq/L (ref 19–32)
Calcium: 8.9 mg/dL (ref 8.4–10.5)
Chloride: 105 mEq/L (ref 96–112)
Creatinine, Ser: 1.12 mg/dL (ref 0.40–1.50)
GFR: 66.67 mL/min (ref >60.00)
Glucose, Bld: 104 mg/dL — ABNORMAL HIGH (ref 70–99)
Potassium: 4 mEq/L (ref 3.5–5.1)
Sodium: 140 mEq/L (ref 135–145)
Total Bilirubin: 0.9 mg/dL (ref 0.2–1.2)
Total Protein: 6.1 g/dL (ref 6.0–8.3)

## 2021-03-23 LAB — CBC
HCT: 39.1 % (ref 39.0–52.0)
Hemoglobin: 13.5 g/dL (ref 13.0–17.0)
MCHC: 34.6 g/dL (ref 30.0–36.0)
MCV: 88 fl (ref 78.0–100.0)
Platelets: 117 10*3/uL — ABNORMAL LOW (ref 150.0–400.0)
RBC: 4.44 Mil/uL (ref 4.22–5.81)
RDW: 13.6 % (ref 11.5–15.5)
WBC: 7.4 10*3/uL (ref 4.0–10.5)

## 2021-03-23 LAB — LIPID PANEL
Cholesterol: 131 mg/dL (ref 0–200)
HDL: 53.7 mg/dL (ref >39.00)
LDL Cholesterol: 54 mg/dL (ref 0–99)
NonHDL: 77.27
Total CHOL/HDL Ratio: 2
Triglycerides: 117 mg/dL (ref 0.0–149.0)
VLDL: 23.4 mg/dL (ref 0.0–40.0)

## 2021-03-27 ENCOUNTER — Ambulatory Visit: Payer: Medicare Other | Attending: Urology | Admitting: Physical Therapy

## 2021-03-27 ENCOUNTER — Other Ambulatory Visit: Payer: Self-pay

## 2021-03-27 ENCOUNTER — Other Ambulatory Visit: Payer: Self-pay | Admitting: Primary Care

## 2021-03-27 DIAGNOSIS — M6208 Separation of muscle (nontraumatic), other site: Secondary | ICD-10-CM | POA: Insufficient documentation

## 2021-03-27 DIAGNOSIS — M533 Sacrococcygeal disorders, not elsewhere classified: Secondary | ICD-10-CM | POA: Insufficient documentation

## 2021-03-27 DIAGNOSIS — M2170 Unequal limb length (acquired), unspecified site: Secondary | ICD-10-CM | POA: Insufficient documentation

## 2021-03-27 DIAGNOSIS — R2689 Other abnormalities of gait and mobility: Secondary | ICD-10-CM | POA: Diagnosis not present

## 2021-03-27 DIAGNOSIS — R278 Other lack of coordination: Secondary | ICD-10-CM | POA: Diagnosis not present

## 2021-03-27 NOTE — Therapy (Signed)
[place] MAIN [facility] SERVICES [address] [place], [place], [address] Phone: [phone]   Fax:  [phone]  Physical Therapy Treatment  Patient Details  Name: Brian Glass MRN: [id number] Date of Birth: 10-04-1950 Referring Provider (PT): [place]    Encounter Date: 03/27/2021   PT End of Session - 03/27/21 1125     Visit Number 5    Number of Visits 10    Date for PT Re-Evaluation 03/29/21    PT Start Time 1106    PT Stop Time 1208    PT Time Calculation (min) 62 min    Activity Tolerance Patient tolerated treatment well    Behavior During Therapy [facility] for tasks assessed/performed             Past Medical History:  Diagnosis Date   Allergy    Arthritis    Atrial fibrillation ([place])    Bilateral vitreous detachment    Chickenpox    Hx of basal cell carcinoma 2018   L malar cheek   Prostate cancer ([place]) 2020   Typical atrial flutter ([place])     Past Surgical History:  Procedure Laterality Date   ATRIAL FIBRILLATION ABLATION N/A 05/10/2016   Procedure: Atrial Fibrillation Ablation;  Surgeon: [name], MD;  Location: [place] [facility];  Service: Cardiovascular;  Laterality: N/A;   CATARACT EXTRACTION, BILATERAL  2016   COLONOSCOPY WITH PROPOFOL N/A 10/20/2018   Procedure: COLONOSCOPY WITH PROPOFOL;  Surgeon: Lin Landsman, MD;  Location: [facility] ENDOSCOPY;  Service: Gastroenterology;  Laterality: N/A;   EYE SURGERY     MOHS SURGERY Left 2018   malar cheek, BCC   ROBOT ASSISTED LAPAROSCOPIC RADICAL PROSTATECTOMY N/A 04/06/2019   Procedure: XI ROBOTIC ASSISTED LAPAROSCOPIC RADICAL PROSTATECTOMY;  Surgeon: Billey Co, MD;  Location: [facility] ORS;  Service: Urology;  Laterality: N/A;   TONSILLECTOMY  1955    There were no vitals filed for this visit.   Subjective Assessment - 03/27/21 1108     Subjective Pt reported no leakage yesterday but today he leaked.    Pertinent History Prostate Cancer with RALP surgery,  MOHS surgery for skin cancer    Patient Stated Goals go without a pad                [facility] PT Assessment - 03/27/21 1205       Palpation   Palpation comment scar restrictions above umbilicus , fascial restrictions at medial aspect and low suprapubic areas                        Pelvic Floor Special Questions - 03/27/21 1204     Diastasis Recti 1 finger below and above umbilicus    External Perineal Exam through undergarment: tightness at R pelvic floor               [facility] Adult PT Treatment/Exercise - 03/27/21 1126       Therapeutic Activites    Other Therapeutic Activities communicated with urologist and PCP re: OSA and risk factors      Neuro Re-ed    Neuro Re-ed Details  cued for R pelvic floor stretches      Modalities   Modalities Moist Heat      Moist Heat Therapy   Number Minutes Moist Heat 5 Minutes    Moist Heat Location --   perineum through clothing in butterfly pose, knee supported to lengthen pelvic floor     Manual  Therapy   Manual therapy comments STM/MWM/ fascial releases at problem areas noted in assessment to promote mobility at abdominal wall with pelvic floor and more upward movement/lenthening of R pelvic floor                       PT Short Term Goals - 02/27/21 1809       PT SHORT TERM GOAL #1   Title pt will demo correct technique with deep core levle 1 and 2 in order to increase intraabdominal pressure for optimal lengtehning of pelvic floor to minimize delayed urination and minimizing incontinence    Time 1    Period Weeks    Status On-going      PT SHORT TERM GOAL #2   Title Pt will demo decreased 3 to < 1  fingers width along linea alba in order to strengtehn abdominal pelvic tensigrity for better outcomes with kegel strengthening and increase postural stability.    Time 1    Period Days    Status Achieved      PT SHORT TERM GOAL #3   Title Pt will demo proper body mechanics to minimize straining  abdominopelvic area and optimize pelvic floor    Time 1    Period Weeks    Status On-going               PT Long Term Goals - 03/27/21 1127       PT LONG TERM GOAL #1   Title Pt's PCP will get contacted about sleep study test given pt's risk factors of OSA ( nocturia, Afill)    Time 5    Period Weeks    Status Achieved    Target Date 02/22/21      PT LONG TERM GOAL #2   Title Pt will decrease pad from 1 to zero at home across 1 week in order to improve QOL    Time 10    Period Weeks    Status On-going    Target Date 03/29/21      PT LONG TERM GOAL #3   Title Pt will demo proper pelvic coordination/ contraction 3 sec , 3 reps in seated position to improve continence    Time 8    Period Weeks    Status On-going    Target Date 03/15/21      PT LONG TERM GOAL #4   Title Pt will be ablet o get up from sit to stand without L glut pain after watching a movie and getting up at night to pee    Time 10    Period Weeks    Status Achieved    Target Date 03/29/21      PT LONG TERM GOAL #5   Title Pt will improve bladder irritant to water ratio from 15 fl oz of coffee to 16-20 fl oz of water to 15 fl oz of coffee to 30 fl oz of water in order to improve bladder health and urinary function    Time 2    Period Weeks    Status Achieved    Target Date 02/01/21      PT LONG TERM GOAL #6   Title Pt will increase hip abduction on R from 3/5 to > 4/5 in  order to adjust for shorter R leg and to decrease L glut pain and to continue riding bike in spin class with less risk for injuries  (03/15/21: 5/5)    Time 8  Period Weeks    Status Achieved                   Plan - 03/27/21 1125     Clinical Impression Statement Pt showed improved DRA and less abdominal separation but still required fascial releases to promote mobility at abdominal wall with pelvic floor and more upward movement/lengthening of R pelvic floor. Required cues for pelvic anterior tilt and R pelvic  floor mm stretches. Communicated with urologist and PCP re: sleep study to r/o or in OSA given pt's risk factors (A-fib and nocturia). Pt was provided research articles explaining the associated link between A-fib with OSA and nocturia with OSA.   Pt continues to benefit from skilled PT    Examination-Activity Limitations Continence;Toileting;Transfers    Stability/Clinical Decision Making Evolving/Moderate complexity    Rehab Potential Good    PT Frequency 1x / week   10   PT Duration Other (comment)   10   PT Treatment/Interventions Neuromuscular re-education;Cognitive remediation;Therapeutic exercise;Patient/family education;Manual techniques;Moist Heat;Therapeutic activities;Functional mobility training;Stair training;ADLs/Self Care Home Management;Taping;Splinting;Energy conservation;Balance training;Scar mobilization;Spinal Manipulations    Consulted and Agree with Plan of Care Patient             Patient will benefit from skilled therapeutic intervention in order to improve the following deficits and impairments:  Increased muscle spasms, Postural dysfunction, Improper body mechanics, Decreased strength, Decreased safety awareness, Decreased endurance, Hypomobility  Visit Diagnosis: Other abnormalities of gait and mobility  Other lack of coordination  Diastasis recti  Unequal leg length  Sacrococcygeal disorders, not elsewhere classified     Problem List Patient Active Problem List   Diagnosis Date Noted   Thrombocytopenia ([place]) 11/10/2019   History of prostate cancer 04/06/2019   Encounter for screening colonoscopy 10/01/2018   Wart 11/28/2017   Medicare annual wellness visit, subsequent 03/23/2016   Paroxysmal atrial fibrillation ([place]) 11/29/2015   History of prostatectomy 11/29/2015   Hyperlipidemia 03/24/2015    Jerl Mina, PT 03/27/2021, 12:09 PM  [place], [place],  [address] Phone: [phone]   Fax:  [phone]  Name: Brian Glass MRN: [id number] Date of Birth: 08-19-1950

## 2021-03-27 NOTE — Patient Instructions (Addendum)
Stretch for pelvic floor    V- slides  "v heels slide away and then back toward buttocks and then rock knee to slight ,  slide heel along at 11 o clock away from buttocks    10 reps    --  And find anterior tilt of pelvis before doing Deep core and pelvic floor   exercises  -  Do seated pelvic floor 3 sec, 3 reps in seatedposition3 different times o  theday    ( mealtime)

## 2021-04-13 ENCOUNTER — Ambulatory Visit: Payer: Medicare Other | Admitting: Physical Therapy

## 2021-04-13 ENCOUNTER — Other Ambulatory Visit: Payer: Self-pay

## 2021-04-13 DIAGNOSIS — M2170 Unequal limb length (acquired), unspecified site: Secondary | ICD-10-CM

## 2021-04-13 DIAGNOSIS — R2689 Other abnormalities of gait and mobility: Secondary | ICD-10-CM | POA: Diagnosis not present

## 2021-04-13 DIAGNOSIS — M533 Sacrococcygeal disorders, not elsewhere classified: Secondary | ICD-10-CM

## 2021-04-13 DIAGNOSIS — R278 Other lack of coordination: Secondary | ICD-10-CM

## 2021-04-13 DIAGNOSIS — M6208 Separation of muscle (nontraumatic), other site: Secondary | ICD-10-CM | POA: Diagnosis not present

## 2021-04-13 NOTE — Telephone Encounter (Signed)
We need objective numbers of volume to calculate the volume of urine he makes overnight vs during the day. He can ppick up a urinal from clinic that will help with this, and he can send in those results for 3 days worth. Please also send him the voiding diary outline so he knows how to record, thanks  [name], MD 04/13/2021

## 2021-04-13 NOTE — Patient Instructions (Signed)
Refine awareness of exhalation ( soft sound, no upper ab overuse)   Allow for pelvic to rise, low belly sinks, upper belly sinks  Apply in deep core level 1 and 2  and also prior to kegels  and with clamshell ___   ___  Seated kegels: hands on chair seat, upright posture, and expand ribs not breathing into chest bc that makes the pelvic floor go in the opposite direction direction  __ WITH ALL KEGELS  ( Remember, draw all all 3 layers of muscles with gradual lift and not just at the anus)  ___    SCHEDULE:   MORNING  Long holds  3 sec, 3 reps , 2 different sets between other exercises   On your back with morning exercises     5 quick contractions at mealtimes  ( Remember, draw all all 3 layers of muscles with gradual lift)   DINNER: 3 sec, 3 reps , 2 different sets between other exercises   On your back with morning exercises  - deep core, angels, clam R  __

## 2021-04-14 NOTE — Therapy (Addendum)
[place] MAIN [facility] SERVICES [address] [place], [place], [address] Phone: [phone]   Fax:  [phone]  Physical Therapy Treatment  Patient Details  Name: Brian Glass MRN: [id number] Date of Birth: 05/25/1950 Referring Provider (PT): Sninisky    Encounter Date: 04/13/2021   PT End of Session - 04/13/21 1614     Visit Number 6    Number of Visits 10    Date for PT Re-Evaluation 06/22/21   eval 01/18/21   PT Start Time 0932    PT Stop Time 1710    PT Time Calculation (min) 58 min    Activity Tolerance Patient tolerated treatment well    Behavior During Therapy [facility] for tasks assessed/performed             Past Medical History:  Diagnosis Date   Allergy    Arthritis    Atrial fibrillation ([place])    Bilateral vitreous detachment    Chickenpox    Hx of basal cell carcinoma 2018   L malar cheek   Prostate cancer ([place]) 2020   Typical atrial flutter ([place])     Past Surgical History:  Procedure Laterality Date   ATRIAL FIBRILLATION ABLATION N/A 05/10/2016   Procedure: Atrial Fibrillation Ablation;  Surgeon: [name], MD;  Location: [place] CV LAB;  Service: Cardiovascular;  Laterality: N/A;   CATARACT EXTRACTION, BILATERAL  2016   COLONOSCOPY WITH PROPOFOL N/A 10/20/2018   Procedure: COLONOSCOPY WITH PROPOFOL;  Surgeon: Lin Landsman, MD;  Location: [facility] ENDOSCOPY;  Service: Gastroenterology;  Laterality: N/A;   EYE SURGERY     MOHS SURGERY Left 2018   malar cheek, BCC   ROBOT ASSISTED LAPAROSCOPIC RADICAL PROSTATECTOMY N/A 04/06/2019   Procedure: XI ROBOTIC ASSISTED LAPAROSCOPIC RADICAL PROSTATECTOMY;  Surgeon: Billey Co, MD;  Location: [facility] ORS;  Service: Urology;  Laterality: N/A;   TONSILLECTOMY  1955    There were no vitals filed for this visit.   Subjective Assessment - 04/13/21 1615     Subjective Pt is getting up in the middle of night once instead of 2 x a night. Pt had minor leakage after  riding spin bike. Pt is measuring his urine out put as recommended by Dr. Al Pimple. Pt does his kegel exericses on his back 2 sets in the morning, seated in the afternoon.  Pt has not leaked after hiking.    Pertinent History Prostate Cancer with RALP surgery, MOHS surgery for skin cancer    Patient Stated Goals go without a pad                            Pelvic Floor Special Questions - 04/14/21 0935     External Perineal Exam through clothing: seated: dominance of posterior activation, chest breathing. hookylying: 3 sec, 3 reps with cue for less ab overuse               [facility] Adult PT Treatment/Exercise - 04/14/21 0936       Therapeutic Activites    Other Therapeutic Activities discussed with pt to follow Dr. Al Pimple recommendation with tracking urine output with urinal and to also follow up with PCP's referral for sleep study given Hx of Afib, explained importance of proper technique with pelvic floor contractions and not to overdo when mm are not recruited correctly. Provided a systematic approach to kegel program to ensure better outcomes . Provided research on the length of time it takes  for continence to be regained with pelvic rehab post RALP      Neuro Re-ed    Neuro Re-ed Details  cued for less ab overuse. less anal sphincte activation, modified sitting position to promote a stronger contraction,                       PT Short Term Goals - 02/27/21 1809       PT SHORT TERM GOAL #1   Title pt will demo correct technique with deep core levle 1 and 2 in order to increase intraabdominal pressure for optimal lengtehning of pelvic floor to minimize delayed urination and minimizing incontinence    Time 1    Period Weeks    Status On-going      PT SHORT TERM GOAL #2   Title Pt will demo decreased 3 to < 1  fingers width along linea alba in order to strengtehn abdominal pelvic tensigrity for better outcomes with kegel strengthening and increase  postural stability.    Time 1    Period Days    Status Achieved      PT SHORT TERM GOAL #3   Title Pt will demo proper body mechanics to minimize straining abdominopelvic area and optimize pelvic floor    Time 1    Period Weeks    Status On-going               PT Long Term Goals - 04/13/21 1616       PT LONG TERM GOAL #1   Title Pt's PCP will get contacted about sleep study test given pt's risk factors of OSA ( nocturia, Afill)    Time 5    Period Weeks    Status Achieved    Target Date 02/22/21      PT LONG TERM GOAL #2   Title Pt will decrease pad from 1 to zero at home across 1 week in order to improve QOL    Time 10    Period Weeks    Status On-going    Target Date 03/29/21      PT LONG TERM GOAL #3   Title Pt will demo proper pelvic coordination/ contraction 3 sec , 3 reps in seated position to improve continence    Time 8    Period Weeks    Status On-going    Target Date 03/15/21      PT LONG TERM GOAL #4   Title Pt will be ablet o get up from sit to stand without L glut pain after watching a movie and getting up at night to pee    Time 10    Period Weeks    Status Achieved    Target Date 03/29/21      PT LONG TERM GOAL #5   Title Pt will improve bladder irritant to water ratio from 15 fl oz of coffee to 16-20 fl oz of water to 15 fl oz of coffee to 30 fl oz of water in order to improve bladder health and urinary function    Time 2    Period Weeks    Status Achieved    Target Date 02/01/21      PT LONG TERM GOAL #6   Title Pt will increase hip abduction on R from 3/5 to > 4/5 in  order to adjust for shorter R leg and to decrease L glut pain and to continue riding bike in spin class with less risk for injuries  (  03/15/21: 5/5)    Time 8    Period Weeks    Status Achieved                   Plan - 04/13/21 1616     Clinical Impression Statement Pt reported nocturia has decreased to 1x night instead of 2+ a night. Pt had one minor leakage  after biking in spin class but doe snot leak with hiking.    Pt has been doing pelvic floor HEP but demo'd incorrections to technique. Explained importance of proper technique with pelvic floor contractions and not to overdo when mm are not recruited correctly. Provided a systematic approach to kegel program to ensure better outcomes. Provided cues for proper technique and less upper ab overuse.   Discussed with pt to follow Dr. Al Pimple recommendation with tracking urine output and to also follow up with PCP's referral for sleep study given Hx of Afib.   Provided research on the length of time it takes for continence to be regained with pelvic rehab following  RALP  to reinforce dedication to HEP and patience.   Pt continues to benefit from skilled PT. Continue to ensure proper pelvic technique at next session and promote strengthening to R hip abduction.    Examination-Activity Limitations Continence;Toileting;Transfers    Stability/Clinical Decision Making Evolving/Moderate complexity    Rehab Potential Good    PT Frequency 1x / week   10   PT Duration Other (comment)   10   PT Treatment/Interventions Neuromuscular re-education;Cognitive remediation;Therapeutic exercise;Patient/family education;Manual techniques;Moist Heat;Therapeutic activities;Functional mobility training;Stair training;ADLs/Self Care Home Management;Taping;Splinting;Energy conservation;Balance training;Scar mobilization;Spinal Manipulations    Consulted and Agree with Plan of Care Patient             Patient will benefit from skilled therapeutic intervention in order to improve the following deficits and impairments:  Increased muscle spasms, Postural dysfunction, Improper body mechanics, Decreased strength, Decreased safety awareness, Decreased endurance, Hypomobility  Visit Diagnosis: Other abnormalities of gait and mobility  Other lack of coordination  Diastasis recti  Unequal leg length  Sacrococcygeal  disorders, not elsewhere classified     Problem List Patient Active Problem List   Diagnosis Date Noted   Thrombocytopenia ([place]) 11/10/2019   History of prostate cancer 04/06/2019   Encounter for screening colonoscopy 10/01/2018   Wart 11/28/2017   Medicare annual wellness visit, subsequent 03/23/2016   Paroxysmal atrial fibrillation ([place]) 11/29/2015   History of prostatectomy 11/29/2015   Hyperlipidemia 03/24/2015    Jerl Mina, PT 04/14/2021, 9:39 AM  [place], [place], [address] Phone: [phone]   Fax:  [phone]  Name: Brian Glass MRN: [id number] Date of Birth: 01/26/1951

## 2021-04-18 ENCOUNTER — Ambulatory Visit: Payer: Medicare Other | Admitting: Physical Therapy

## 2021-04-19 ENCOUNTER — Other Ambulatory Visit: Payer: Self-pay

## 2021-04-19 ENCOUNTER — Ambulatory Visit (INDEPENDENT_AMBULATORY_CARE_PROVIDER_SITE_OTHER): Payer: Medicare Other | Admitting: Dermatology

## 2021-04-19 DIAGNOSIS — L570 Actinic keratosis: Secondary | ICD-10-CM | POA: Diagnosis not present

## 2021-04-19 DIAGNOSIS — L821 Other seborrheic keratosis: Secondary | ICD-10-CM | POA: Diagnosis not present

## 2021-04-19 DIAGNOSIS — L820 Inflamed seborrheic keratosis: Secondary | ICD-10-CM

## 2021-04-19 DIAGNOSIS — L578 Other skin changes due to chronic exposure to nonionizing radiation: Secondary | ICD-10-CM

## 2021-04-19 NOTE — Progress Notes (Signed)
Follow-Up Visit   Subjective  Brian Glass is a 71 y.o. male who presents for the following: Follow-up (Patient reports that reports a spot at left shoulder and back that he would like checked. ). Some are irritated and itchy.  The following portions of the chart were reviewed this encounter and updated as appropriate:  Tobacco   Allergies   Meds   Problems   Med Hx   Surg Hx   Fam Hx       Review of Systems: No other skin or systemic complaints except as noted in HPI or Assessment and Plan.   Objective  Well appearing patient in no apparent distress; mood and affect are within normal limits.  A focused examination was performed including back, chest, left shoulder, face, left hand, arms. Relevant physical exam findings are noted in the Assessment and Plan.  left dorsal hand x 4, left temple x 1, right frontal scalp x 2, right temple x 1 (8) Erythematous thin papules/macules with gritty scale.   mid back x 1, right flank x 1, left upper back x 1 (3) Erythematous stuck-on, waxy papule or plaque   Assessment & Plan  Actinic keratosis (8) left dorsal hand x 4, left temple x 1, right frontal scalp x 2, right temple x 1  Actinic keratoses are precancerous spots that appear secondary to cumulative UV radiation exposure/sun exposure over time. They are chronic with expected duration over 1 year. A portion of actinic keratoses will progress to squamous cell carcinoma of the skin. It is not possible to reliably predict which spots will progress to skin cancer and so treatment is recommended to prevent development of skin cancer.  Recommend daily broad spectrum sunscreen SPF 30+ to sun-exposed areas, reapply every 2 hours as needed.  Recommend staying in the shade or wearing long sleeves, sun glasses (UVA+UVB protection) and wide brim hats (4-inch brim around the entire circumference of the hat). Call for new or changing lesions.  Prior to procedure, discussed risks of blister formation,  small wound, skin dyspigmentation, or rare scar following cryotherapy. Recommend Vaseline ointment to treated areas while healing.   Destruction of lesion - left dorsal hand x 4, left temple x 1, right frontal scalp x 2, right temple x 1 Complexity: simple   Destruction method: cryotherapy   Informed consent: discussed and consent obtained   Timeout:  patient name, date of birth, surgical site, and procedure verified Lesion destroyed using liquid nitrogen: Yes   Region frozen until ice ball extended beyond lesion: Yes   Cryotherapy cycles:  2 Outcome: patient tolerated procedure well with no complications   Post-procedure details: wound care instructions given   Additional details:  Prior to procedure, discussed risks of blister formation, small wound, skin dyspigmentation, or rare scar following cryotherapy. Recommend Vaseline ointment to treated areas while healing.   Inflamed seborrheic keratosis (3) mid back x 1, right flank x 1, left upper back x 1  Irritated   Destruction of lesion - mid back x 1, right flank x 1, left upper back x 1  Destruction method: cryotherapy   Informed consent: discussed and consent obtained   Lesion destroyed using liquid nitrogen: Yes   Cryotherapy cycles:  2 Outcome: patient tolerated procedure well with no complications   Post-procedure details: wound care instructions given   Additional details:  Prior to procedure, discussed risks of blister formation, small wound, skin dyspigmentation, or rare scar following cryotherapy. Recommend Vaseline ointment to treated areas while healing.  Seborrheic Keratoses - Stuck-on, waxy, tan-brown papules and/or plaques  - Benign-appearing - Discussed benign etiology and prognosis. - Observe - Call for any changes  Actinic Damage - chronic, secondary to cumulative UV radiation exposure/sun exposure over time - diffuse scaly erythematous macules with underlying dyspigmentation - Recommend daily broad  spectrum sunscreen SPF 30+ to sun-exposed areas, reapply every 2 hours as needed.  - Recommend staying in the shade or wearing long sleeves, sun glasses (UVA+UVB protection) and wide brim hats (4-inch brim around the entire circumference of the hat). - Call for new or changing lesions.  Return for keep follow up as scheduled . I, Ruthell Rummage, CMA, am acting as scribe for Forest Gleason, MD.  Documentation: I have reviewed the above documentation for accuracy and completeness, and I agree with the above.  Forest Gleason, MD

## 2021-04-19 NOTE — Patient Instructions (Addendum)
Actinic keratoses are precancerous spots that appear secondary to cumulative UV radiation exposure/sun exposure over time. They are chronic with expected duration over 1 year. A portion of actinic keratoses will progress to squamous cell carcinoma of the skin. It is not possible to reliably predict which spots will progress to skin cancer and so treatment is recommended to prevent development of skin cancer.  Recommend daily broad spectrum sunscreen SPF 30+ to sun-exposed areas, reapply every 2 hours as needed.  Recommend staying in the shade or wearing long sleeves, sun glasses (UVA+UVB protection) and wide brim hats (4-inch brim around the entire circumference of the hat). Call for new or changing lesions.   Cryotherapy Aftercare  Wash gently with soap and water everyday.   Apply Vaseline and Band-Aid daily until healed.    Seborrheic Keratosis  What causes seborrheic keratoses? Seborrheic keratoses are harmless, common skin growths that first appear during adult life.  As time goes by, more growths appear.  Some people may develop a large number of them.  Seborrheic keratoses appear on both covered and uncovered body parts.  They are not caused by sunlight.  The tendency to develop seborrheic keratoses can be inherited.  They vary in color from skin-colored to gray, brown, or even black.  They can be either smooth or have a rough, warty surface.   Seborrheic keratoses are superficial and look as if they were stuck on the skin.  Under the microscope this type of keratosis looks like layers upon layers of skin.  That is why at times the top layer may seem to fall off, but the rest of the growth remains and re-grows.    Treatment Seborrheic keratoses do not need to be treated, but can easily be removed in the office.  Seborrheic keratoses often cause symptoms when they rub on clothing or jewelry.  Lesions can be in the way of shaving.  If they become inflamed, they can cause itching, soreness, or  burning.  Removal of a seborrheic keratosis can be accomplished by freezing, burning, or surgery. If any spot bleeds, scabs, or grows rapidly, please return to have it checked, as these can be an indication of a skin cancer.  Recommend taking Heliocare sun protection supplement daily in sunny weather for additional sun protection. For maximum protection on the sunniest days, you can take up to 2 capsules of regular Heliocare OR take 1 capsule of Heliocare Ultra. For prolonged exposure (such as a full day in the sun), you can repeat your dose of the supplement 4 hours after your first dose. Heliocare can be purchased at [employer], at some [facility] or at [email or web address].    Melanoma ABCDEs  Melanoma is the most dangerous type of skin cancer, and is the leading cause of death from skin disease.  You are more likely to develop melanoma if you: Have light-colored skin, light-colored eyes, or red or blond hair Spend a lot of time in the sun Tan regularly, either outdoors or in a tanning bed Have had blistering sunburns, especially during childhood Have a close family member who has had a melanoma Have atypical moles or large birthmarks  Early detection of melanoma is key since treatment is typically straightforward and cure rates are extremely high if we catch it early.   The first sign of melanoma is often a change in a mole or a new dark spot.  The ABCDE system is a way of remembering the signs of melanoma.  A for asymmetry:  The  two halves do not match. B for border:  The edges of the growth are irregular. C for color:  A mixture of colors are present instead of an even brown color. D for diameter:  Melanomas are usually (but not always) greater than 12mm - the size of a pencil eraser. E for evolution:  The spot keeps changing in size, shape, and color.  Please check your skin once per month between visits. You can use a small mirror in front and a large mirror behind you to keep  an eye on the back side or your body.   If you see any new or changing lesions before your next follow-up, please call to schedule a visit.  Please continue daily skin protection including broad spectrum sunscreen SPF 30+ to sun-exposed areas, reapplying every 2 hours as needed when you're outdoors.   Staying in the shade or wearing long sleeves, sun glasses (UVA+UVB protection) and wide brim hats (4-inch brim around the entire circumference of the hat) are also recommended for sun protection.    If You Need Anything After Your Visit  If you have any questions or concerns for your doctor, please call our main line at [phone] and press option 4 to reach your doctor's medical assistant. If no one answers, please leave a voicemail as directed and we will return your call as soon as possible. Messages left after 4 pm will be answered the following business day.   You may also send [place] a message via [place]. We typically respond to MyChart messages within 1-2 business days.  For prescription refills, please ask your pharmacy to contact our office. Our fax number is [phone].  If you have an urgent issue when the clinic is closed that cannot wait until the next business day, you can page your doctor at the number below.    Please note that while we do our best to be available for urgent issues outside of office hours, we are not available 24/7.   If you have an urgent issue and are unable to reach [place], you may choose to seek medical care at your doctor's office, retail clinic, urgent care center, or emergency room.  If you have a medical emergency, please immediately call 911 or go to the emergency department.  Pager Numbers  - Dr. Nehemiah Massed: [phone]  - Dr. Laurence Ferrari: [phone]  - Dr. Nicole Kindred: [phone]  In the event of inclement weather, please call our main line at [phone] for an update on the status of any delays or closures.  Dermatology Medication Tips: Please keep  the boxes that topical medications come in in order to help keep track of the instructions about where and how to use these. Pharmacies typically print the medication instructions only on the boxes and not directly on the medication tubes.   If your medication is too expensive, please contact our office at [phone] option 4 or send [place] a message through [facility] [facility].   We are unable to tell what your co-pay for medications will be in advance as this is different depending on your insurance coverage. However, we may be able to find a substitute medication at lower cost or fill out paperwork to get insurance to cover a needed medication.   If a prior authorization is required to get your medication covered by your insurance company, please allow [place] 1-2 business days to complete this process.  Drug prices often vary depending on where the prescription is filled and some pharmacies may offer cheaper prices.  The website www.goodrx.com contains coupons for medications through different pharmacies. The prices here do not account for what the cost may be with help from insurance (it may be cheaper with your insurance), but the website can give you the price if you did not use any insurance.  - You can print the associated coupon and take it with your prescription to the pharmacy.  - You may also stop by our office during regular business hours and pick up a GoodRx coupon card.  - If you need your prescription sent electronically to a different pharmacy, notify our office through [facility] or by phone at [phone] option 4.     Si Usted Necesita Algo Despus de Su Visita  Tambin puede enviarnos un mensaje a travs de Pharmacist, community. Por lo general respondemos a los mensajes de MyChart en el transcurso de 1 a 2 das hbiles.  Para renovar recetas, por favor pida a su farmacia que se ponga en contacto con nuestra oficina. Harland Dingwall de fax es [place] [phone].  Si tiene un asunto urgente cuando  la clnica est cerrada y que no puede esperar hasta el siguiente da hbil, puede llamar/localizar a su doctor(a) al nmero que aparece a continuacin.   Por favor, tenga en cuenta que aunque hacemos todo lo posible para estar disponibles para asuntos urgentes fuera del horario de [place], no estamos disponibles las 24 horas del da, los 7 das de la [place].   Si tiene un problema urgente y no puede comunicarse con nosotros, puede optar por buscar atencin mdica  en el consultorio de su doctor(a), en una clnica privada, en un centro de atencin urgente o en una sala de emergencias.  Si tiene Engineering geologist, por favor llame inmediatamente al 911 o vaya a la sala de emergencias.  Nmeros de bper  - Dr. Nehemiah Massed: [phone]  - Dra. Moye: [phone]  - Dra. Nicole Kindred: [phone]  En caso de inclemencias del [place], por favor llame a Johnsie Kindred principal al [phone] para una actualizacin sobre el [place] de cualquier retraso o cierre.  Consejos para la medicacin en dermatologa: Por favor, guarde las cajas en las que vienen los medicamentos de uso tpico para ayudarle a seguir las instrucciones sobre dnde y cmo usarlos. Las farmacias generalmente imprimen las instrucciones del medicamento slo en las cajas y no directamente en los tubos del [place].   Si su medicamento es muy caro, por favor, pngase en contacto con Zigmund Daniel llamando al [phone] y presione la opcin 4 o envenos un mensaje a travs de Pharmacist, community.   No podemos decirle cul ser su copago por los medicamentos por adelantado ya que esto es diferente dependiendo de la cobertura de su seguro. Sin embargo, es posible que podamos encontrar un medicamento sustituto a Electrical engineer un formulario para que el seguro cubra el medicamento que se considera necesario.   Si se requiere una autorizacin previa para que su compaa de seguros [place] su medicamento, por favor permtanos de 1 a 2 das  hbiles para completar este proceso.  Los precios de los medicamentos varan con frecuencia dependiendo del Environmental consultant de dnde se surte la receta y alguna farmacias pueden ofrecer precios ms baratos.  El sitio web www.goodrx.com tiene cupones para medicamentos de Airline pilot. Los precios aqu no tienen en cuenta lo que podra costar con la ayuda del seguro (puede ser ms barato con su seguro), pero el sitio web puede darle el precio si no utiliz Research scientist (physical sciences).  -  Puede imprimir el cupn correspondiente y llevarlo con su receta a la farmacia.  - Tambin puede pasar por nuestra oficina durante el horario de atencin regular y Charity fundraiser una tarjeta de cupones de GoodRx.  - Si necesita que su receta se enve electrnicamente a una farmacia diferente, informe a nuestra oficina a travs de MyChart de [place] o por telfono llamando al [phone] y presione la opcin 4.

## 2021-04-20 ENCOUNTER — Encounter: Payer: Self-pay | Admitting: Dermatology

## 2021-04-27 NOTE — Telephone Encounter (Signed)
From what I can understand from the chart he is only getting up once per night?  Most patients over 50 will get up 1-2x at night and this is not uncommon.  The other thing that would be helpful is the volume of urine he is urinating overnight, and I do not see that on the most recent chart.  [name], MD 04/27/2021

## 2021-05-04 ENCOUNTER — Other Ambulatory Visit: Payer: Self-pay

## 2021-05-04 ENCOUNTER — Ambulatory Visit: Payer: Medicare Other | Attending: Urology | Admitting: Physical Therapy

## 2021-05-04 DIAGNOSIS — R2689 Other abnormalities of gait and mobility: Secondary | ICD-10-CM | POA: Diagnosis not present

## 2021-05-04 DIAGNOSIS — M533 Sacrococcygeal disorders, not elsewhere classified: Secondary | ICD-10-CM | POA: Insufficient documentation

## 2021-05-04 DIAGNOSIS — R278 Other lack of coordination: Secondary | ICD-10-CM | POA: Diagnosis not present

## 2021-05-04 DIAGNOSIS — M6208 Separation of muscle (nontraumatic), other site: Secondary | ICD-10-CM | POA: Insufficient documentation

## 2021-05-04 DIAGNOSIS — M2170 Unequal limb length (acquired), unspecified site: Secondary | ICD-10-CM | POA: Diagnosis not present

## 2021-05-05 NOTE — Therapy (Signed)
[place] MAIN [facility] SERVICES [address] [place], [place], [address] Phone: [phone]   Fax:  [phone]  Physical Therapy Treatment  Patient Details  Name: Brian Glass MRN: [id number] Date of Birth: 12/26/1950 Referring Provider (PT): Sninisky    Encounter Date: 05/04/2021   PT End of Session - 05/04/21 1522     Visit Number 7    Number of Visits 10    Date for PT Re-Evaluation 06/22/21   eval 01/18/21   PT Start Time 1510    PT Stop Time 1610    PT Time Calculation (min) 60 min    Activity Tolerance Patient tolerated treatment well    Behavior During Therapy [facility] for tasks assessed/performed             Past Medical History:  Diagnosis Date   Allergy    Arthritis    Atrial fibrillation ([place])    Bilateral vitreous detachment    Chickenpox    Hx of basal cell carcinoma 2018   L malar cheek   Prostate cancer ([place]) 2020   Typical atrial flutter ([place])     Past Surgical History:  Procedure Laterality Date   ATRIAL FIBRILLATION ABLATION N/A 05/10/2016   Procedure: Atrial Fibrillation Ablation;  Surgeon: [name], MD;  Location: [place] [facility];  Service: Cardiovascular;  Laterality: N/A;   CATARACT EXTRACTION, BILATERAL  2016   COLONOSCOPY WITH PROPOFOL N/A 10/20/2018   Procedure: COLONOSCOPY WITH PROPOFOL;  Surgeon: Lin Landsman, MD;  Location: [facility] ENDOSCOPY;  Service: Gastroenterology;  Laterality: N/A;   EYE SURGERY     MOHS SURGERY Left 2018   malar cheek, BCC   ROBOT ASSISTED LAPAROSCOPIC RADICAL PROSTATECTOMY N/A 04/06/2019   Procedure: XI ROBOTIC ASSISTED LAPAROSCOPIC RADICAL PROSTATECTOMY;  Surgeon: Billey Co, MD;  Location: [facility] ORS;  Service: Urology;  Laterality: N/A;   TONSILLECTOMY  1955    There were no vitals filed for this visit.   Subjective Assessment - 05/04/21 1513     Subjective Pt had been wearing apad with minimal amount leakage. Pt had one day when the leakage w/  Pt as a  "diaster". Pt will try to go without a pad.  Pt has not been as religious with his pelvic floor program. Pt completed his urinalysis and reported back to Dr. Al Pimple and he told pt that hisurine output is fine and it did not warrant a sleep study. Pt reported over age, nocturia occurs 1 x across 4 nights, 2x across 3 nights.    Pertinent History Prostate Cancer with RALP surgery, MOHS surgery for skin cancer    Patient Stated Goals go without a pad                            Pelvic Floor Special Questions - 05/05/21 1142     External Perineal Exam through undergarments: tightness at R suprapubic mm attachments, ischiocavernosus, bulbospongious R,    External Palpation downward movement with cue, (post Tx: improved upward movemetn of anterior triangle mm post Tx with contractions)               [facility] Adult PT Treatment/Exercise - 05/05/21 1143       Neuro Re-ed    Neuro Re-ed Details  cued for less force, anterior tilt of pelvis to elicit a more upward movement of pelvic floor      Modalities   Modalities Moist Heat  Moist Heat Therapy   Number Minutes Moist Heat 5 Minutes    Moist Heat Location --   perineum     Manual Therapy   Manual therapy comments STM/MWM at problem areas noted in assessment to promote lengthening and proper contraction                       PT Short Term Goals - 04/14/21 0942       PT SHORT TERM GOAL #1   Title pt will demo correct technique with deep core levle 1 and 2 in order to increase intraabdominal pressure for optimal lengtehning of pelvic floor to minimize delayed urination and minimizing incontinence    Time 1    Period Weeks    Status Partially Met      PT SHORT TERM GOAL #2   Title Pt will demo decreased 3 to < 1  fingers width along linea alba in order to strengtehn abdominal pelvic tensigrity for better outcomes with kegel strengthening and increase postural stability.    Time 1    Period Days     Status Achieved      PT SHORT TERM GOAL #3   Title Pt will demo proper body mechanics to minimize straining abdominopelvic area and optimize pelvic floor    Time 1    Period Weeks    Status Achieved               PT Long Term Goals - 04/13/21 1616       PT LONG TERM GOAL #1   Title Pt's PCP will get contacted about sleep study test given pt's risk factors of OSA ( nocturia, Afill)    Time 5    Period Weeks    Status Achieved    Target Date 02/22/21      PT LONG TERM GOAL #2   Title Pt will decrease pad from 1 to zero at home across 1 week in order to improve QOL    Time 10    Period Weeks    Status On-going    Target Date 03/29/21      PT LONG TERM GOAL #3   Title Pt will demo proper pelvic coordination/ contraction 3 sec , 3 reps in seated position to improve continence    Time 8    Period Weeks    Status On-going    Target Date 03/15/21      PT LONG TERM GOAL #4   Title Pt will be ablet o get up from sit to stand without L glut pain after watching a movie and getting up at night to pee    Time 10    Period Weeks    Status Achieved    Target Date 03/29/21      PT LONG TERM GOAL #5   Title Pt will improve bladder irritant to water ratio from 15 fl oz of coffee to 16-20 fl oz of water to 15 fl oz of coffee to 30 fl oz of water in order to improve bladder health and urinary function    Time 2    Period Weeks    Status Achieved    Target Date 02/01/21      PT LONG TERM GOAL #6   Title Pt will increase hip abduction on R from 3/5 to > 4/5 in  order to adjust for shorter R leg and to decrease L glut pain and to continue riding bike in spin class with  less risk for injuries  (03/15/21: 5/5)    Time 8    Period Weeks    Status Achieved                   Plan - 05/04/21 1522     Clinical Impression Statement Pt continued to require manual Tx to minimize R pelvic floor mm tightness which is related to shorter leg length. PT required excessive cues for  anterior pelvic tilt and upward movement of pelvic floor. Pt continues to benefit from skilled PT   Examination-Activity Limitations Continence;Toileting;Transfers    Stability/Clinical Decision Making Evolving/Moderate complexity    Rehab Potential Good    PT Frequency 1x / week   10   PT Duration Other (comment)   10   PT Treatment/Interventions Neuromuscular re-education;Cognitive remediation;Therapeutic exercise;Patient/family education;Manual techniques;Moist Heat;Therapeutic activities;Functional mobility training;Stair training;ADLs/Self Care Home Management;Taping;Splinting;Energy conservation;Balance training;Scar mobilization;Spinal Manipulations    Consulted and Agree with Plan of Care Patient             Patient will benefit from skilled therapeutic intervention in order to improve the following deficits and impairments:  Increased muscle spasms, Postural dysfunction, Improper body mechanics, Decreased strength, Decreased safety awareness, Decreased endurance, Hypomobility  Visit Diagnosis: Other abnormalities of gait and mobility  Other lack of coordination  Diastasis recti  Unequal leg length  Sacrococcygeal disorders, not elsewhere classified     Problem List Patient Active Problem List   Diagnosis Date Noted   Thrombocytopenia ([place]) 11/10/2019   History of prostate cancer 04/06/2019   Encounter for screening colonoscopy 10/01/2018   Wart 11/28/2017   Medicare annual wellness visit, subsequent 03/23/2016   Paroxysmal atrial fibrillation ([place]) 11/29/2015   History of prostatectomy 11/29/2015   Hyperlipidemia 03/24/2015    Jerl Mina, PT 05/05/2021, 11:45 AM  [place], [place], [address] Phone: [phone]   Fax:  [phone]  Name: Bernd Crom MRN: [id number] Date of Birth: May 21, 1950

## 2021-05-17 ENCOUNTER — Other Ambulatory Visit: Payer: Self-pay

## 2021-05-17 ENCOUNTER — Ambulatory Visit: Payer: Medicare Other | Attending: Urology | Admitting: Physical Therapy

## 2021-05-17 DIAGNOSIS — M533 Sacrococcygeal disorders, not elsewhere classified: Secondary | ICD-10-CM | POA: Insufficient documentation

## 2021-05-17 DIAGNOSIS — M6208 Separation of muscle (nontraumatic), other site: Secondary | ICD-10-CM | POA: Insufficient documentation

## 2021-05-17 DIAGNOSIS — M2170 Unequal limb length (acquired), unspecified site: Secondary | ICD-10-CM | POA: Diagnosis not present

## 2021-05-17 DIAGNOSIS — R278 Other lack of coordination: Secondary | ICD-10-CM | POA: Insufficient documentation

## 2021-05-17 DIAGNOSIS — R2689 Other abnormalities of gait and mobility: Secondary | ICD-10-CM | POA: Insufficient documentation

## 2021-05-17 NOTE — Therapy (Signed)
[place] MAIN [facility] SERVICES [address] [place], [place], [address] Phone: [phone]   Fax:  [phone]  Physical Therapy Treatment  Patient Details  Name: Brian Glass MRN: [id number] Date of Birth: 03-Nov-1950 Referring Provider (PT): Sninisky    Encounter Date: 05/17/2021   PT End of Session - 05/17/21 1013     Visit Number 8    Number of Visits 10    Date for PT Re-Evaluation 06/22/21   eval 01/18/21   PT Start Time 1010    PT Stop Time 1109    PT Time Calculation (min) 59 min    Activity Tolerance Patient tolerated treatment well    Behavior During Therapy [facility] for tasks assessed/performed             Past Medical History:  Diagnosis Date   Allergy    Arthritis    Atrial fibrillation ([place])    Bilateral vitreous detachment    Chickenpox    Hx of basal cell carcinoma 2018   L malar cheek   Prostate cancer ([place]) 2020   Typical atrial flutter ([place])     Past Surgical History:  Procedure Laterality Date   ATRIAL FIBRILLATION ABLATION N/A 05/10/2016   Procedure: Atrial Fibrillation Ablation;  Surgeon: [name], MD;  Location: [place] [facility];  Service: Cardiovascular;  Laterality: N/A;   CATARACT EXTRACTION, BILATERAL  2016   COLONOSCOPY WITH PROPOFOL N/A 10/20/2018   Procedure: COLONOSCOPY WITH PROPOFOL;  Surgeon: Lin Landsman, MD;  Location: [facility] ENDOSCOPY;  Service: Gastroenterology;  Laterality: N/A;   EYE SURGERY     MOHS SURGERY Left 2018   malar cheek, BCC   ROBOT ASSISTED LAPAROSCOPIC RADICAL PROSTATECTOMY N/A 04/06/2019   Procedure: XI ROBOTIC ASSISTED LAPAROSCOPIC RADICAL PROSTATECTOMY;  Surgeon: Billey Co, MD;  Location: [facility] ORS;  Service: Urology;  Laterality: N/A;   TONSILLECTOMY  1955    There were no vitals filed for this visit.   Subjective Assessment - 05/17/21 1014     Subjective Pt notices he does not have leakage when his bladder is full. and no leakage when out on his birding  in nature 2-3 hours that involves hiking and standing.  Pt noticed leakage after 20-63min after the first time he went to the bathroom.  Pt has had a habit of "just-in-case" use of bathroom , esp before leaving house.    Pertinent History Prostate Cancer with RALP surgery, MOHS surgery for skin cancer    Patient Stated Goals go without a pad                            Pelvic Floor Special Questions - 05/17/21 1230     External Perineal Exam through clothes: bulbospongious tightness with poor feet co-activation, seated: chest breathing with dyscoordination of pelvic floor, hooklying sustained holds with proper technique and no cues required               [facility] Adult PT Treatment/Exercise - 05/17/21 1228       Therapeutic Activites    Other Therapeutic Activities explained behavorior component to frequent urination, breaking habit of "just in case " pattern, explaining how type of profeession/ habits in the past may contribute to overriding initial urge to urinate or to delay urination leading to tight pelvic floor mm and mix up of Bradley Loops in Micturation Cycle, discussed bladder health with more water intake, co-created strategies for water intake after  motivational interviewing      Neuro Re-ed    Neuro Re-ed Details  cued for more feet co-activation with pelvic floor contractions and relaxation, explained physiology of Bradley loops/ micturition      Manual Therapy   Manual therapy comments STM/MW with pelvic tilts to lengthen bulbospongiosus                       PT Short Term Goals - 04/14/21 0942       PT SHORT TERM GOAL #1   Title pt will demo correct technique with deep core levle 1 and 2 in order to increase intraabdominal pressure for optimal lengtehning of pelvic floor to minimize delayed urination and minimizing incontinence    Time 1    Period Weeks    Status Partially Met      PT SHORT TERM GOAL #2   Title Pt will demo decreased  3 to < 1  fingers width along linea alba in order to strengtehn abdominal pelvic tensigrity for better outcomes with kegel strengthening and increase postural stability.    Time 1    Period Days    Status Achieved      PT SHORT TERM GOAL #3   Title Pt will demo proper body mechanics to minimize straining abdominopelvic area and optimize pelvic floor    Time 1    Period Weeks    Status Achieved               PT Long Term Goals - 05/17/21 1242       PT LONG TERM GOAL #1   Title Pt's PCP will get contacted about sleep study test given pt's risk factors of OSA ( nocturia, Afill)    Time 5    Period Weeks    Status Achieved    Target Date 02/22/21      PT LONG TERM GOAL #2   Title Pt will decrease pad from 1 to zero at home across 1 week in order to improve QOL    Time 10    Period Weeks    Status On-going    Target Date 03/29/21      PT LONG TERM GOAL #3   Title Pt will demo proper pelvic coordination/ contraction 3 sec , 3 reps in seated position to improve continence    Time 8    Period Weeks    Status Achieved    Target Date 03/15/21      PT LONG TERM GOAL #4   Title Pt will be ablet o get up from sit to stand without L glut pain after watching a movie and getting up at night to pee    Time 10    Period Weeks    Status Achieved    Target Date 03/29/21      PT LONG TERM GOAL #5   Title Pt will improve bladder irritant to water ratio from 15 fl oz of coffee to 16-20 fl oz of water to 15 fl oz of coffee to 30 fl oz of water in order to improve bladder health and urinary function    Time 2    Period Weeks    Status Achieved    Target Date 02/01/21      PT LONG TERM GOAL #6   Title Pt will increase hip abduction on R from 3/5 to > 4/5 in  order to adjust for shorter R leg and to decrease L glut pain and to  continue riding bike in spin class with less risk for injuries  (03/15/21: 5/5)    Time 8    Period Weeks    Status Achieved      PT LONG TERM GOAL #7    Title Pt will report no longer having urgency to go 30 min after urination with use of urgency suppression technique    Time 4    Period Weeks    Status New    Target Date 06/14/21                   Plan - 05/17/21 1013     Clinical Impression Statement Pt is making progress with no leakage on a 2-3 hr birding trip and also when his bladder is full.   Pt's remaining issue is needing to pee again after peeing the first time 30 min prior. 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  Explained behavorior component to frequent urination, breaking habit of "just in case " pattern, explaining how type of profeession/ habits in the past may contribute to overriding initial urge to urinate or to delay urination leading to tight pelvic floor mm and mix up of Bradley Loops in micturition cycle, discussed bladder health with more water intake, co-created strategies for water intake after motivational interviewing.  Reassessed pelvic floor coordination for better recruitment in seated position and pt required tactile and verbal cues to minimize dyscoordination of pelvic floor. Pt advanced to long hold contractions without cues from 3 sec, 3 reps  to 4 sec, 3 reps which demo'd good carry over from last session.   Pt continues to benefit from skilled PT    Examination-Activity Limitations Continence;Toileting;Transfers    Stability/Clinical Decision Making Evolving/Moderate complexity    Rehab Potential Good    PT Frequency 1x / week   10   PT Duration Other (comment)   10   PT Treatment/Interventions Neuromuscular re-education;Cognitive remediation;Therapeutic exercise;Patient/family education;Manual techniques;Moist Heat;Therapeutic activities;Functional mobility training;Stair training;ADLs/Self Care Home  Management;Taping;Splinting;Energy conservation;Balance training;Scar mobilization;Spinal Manipulations    Consulted and Agree with Plan of Care Patient             Patient will benefit from skilled therapeutic intervention in order to improve the following deficits and impairments:  Increased muscle spasms, Postural dysfunction, Improper body mechanics, Decreased strength, Decreased safety awareness, Decreased endurance, Hypomobility  Visit Diagnosis: Sacrococcygeal disorders, not elsewhere classified     Problem List Patient Active Problem List   Diagnosis Date Noted   Thrombocytopenia ([place]) 11/10/2019   History of prostate cancer 04/06/2019   Encounter for screening colonoscopy 10/01/2018   Wart 11/28/2017   Medicare annual wellness visit, subsequent 03/23/2016   Paroxysmal atrial fibrillation ([place]) 11/29/2015   History of prostatectomy 11/29/2015   Hyperlipidemia 03/24/2015    Jerl Mina, PT 05/17/2021, 12:46 PM  [place], [place], [address] Phone: [phone]   Fax:  [phone]  Name: Brian Glass MRN: [id number] Date of Birth: 10/05/1950

## 2021-05-17 NOTE — Patient Instructions (Signed)
Drink water 32 fl oz tumbler filled twice a day ? ?8 fl oz of room temp water first thing in am before coffee  ?__ ? ?KEGEL PROGRAM MODIFICATIONS: ? ?Progress long holds to 4 sec, 3 reps when lying on back before and after deep core level 1-2 in am and pm ? ?With seated pelvic floor quick contractions,  ?Make sure to sit with hands on chair by hips for upright position, 6 points of contact, more pressure into feet which helps with anterior tilt of pelvis ? ?Make sure to not breathe upward with chest because this moves pelvic floor in opposite direction. Makesure to expand ribs 360 deg, like tree rings, which helps for pelvic floor lower like a bowl ? ?__ ? ?URGE SUPPRESION TECHNIQUES ? ?These techniques are to be used to suppress those abnormally strong URGES to urinate, especially after you have already urinated < 1hr ago.  ?These steps do not have to be followed in order, and not all steps have to be used.   ?The purpose of these steps is to help you regain control of your bladder, to reduce the amount of urinary urgency, frequency, or leaking.  ?They take practice to master in controlling urgency.  Allow yourself to be okay with leaking when you first start practicing these steps. ?Practice these steps first at home, when you do not have to worry as much about leaking. ? ?SIT DOWN.  Pressure on the pelvic floor inhibits the bladder.  Further pressure may help, such as sitting on a small rolled up towel. ?Lengthening of pelvic floor like a bowl, expansion at ribcage like tree rings ? using elevator imagery.  Breathe in, feel pelvic floor lower to "basement" level by the end of the inhalation. Exhale, feel pelvic floor gradually move up to "1st floor" which is the neutral position of pelvic floor WITHOUT SQUEEZING  ?  5 x  ? ?BREATHE & STAY CALM.  Breathing slowly and remaining calm will inhibit your sympathetic nervous system, which will in turn calm the bladder.   ?DISTRACTION.  Sit with a project that will  engage your mind.  Anything that works for you - reading, word puzzles, crochet, knitting, checking email, balancing the checkbook, and so on. ?VISUALIZATION.  Imagine that you are in a place/situation in which either you cannot or do not want to leave.  Examples: in a car and cannot stop; lying on a beach with a far walk to a restroom; at dinner with someone special. ? ?If the urge persists after practicing these steps and feel you must go to the bathroom, then it is imperative that you . . . . ?Walk slowly and calmly to the bathroom ?Maintain calm breathing ?Refrain from undressing until you are standing over the toilet ?Rushing to the restroom will only encourage the strong bladder urges and leaking. ? ?Break habit of "JUST IN CASE"  ? ?Again, the more you practice, the easier these steps will become.  ?

## 2021-05-19 NOTE — Progress Notes (Signed)
Subjective:   Benard Minturn is a 71 y.o. male who presents for Medicare Annual/Subsequent preventive examination.  I connected with [name] today by telephone and verified that I am speaking with the correct person using two identifiers. Location patient: home Location provider: work Persons participating in the virtual visit: patient, Marine scientist.    I discussed the limitations, risks, security and privacy concerns of performing an evaluation and management service by telephone and the availability of in person appointments. I also discussed with the patient that there may be a patient responsible charge related to this service. The patient expressed understanding and verbally consented to this telephonic visit.    Interactive audio and video telecommunications were attempted between this provider and patient, however failed, due to patient having technical difficulties OR patient did not have access to video capability.  We continued and completed visit with audio only.  Some vital signs may be absent or patient reported.   Time Spent with patient on telephone encounter: 20 minutes  Review of Systems     Cardiac Risk Factors include: advanced age (>21men, >65 women);dyslipidemia     Objective:    Today's Vitals   05/22/21 1155  Weight: 163 lb (73.9 kg)  Height: 5\' 10"  (1.778 m)   Body mass index is 23.39 kg/m.  Advanced Directives 05/22/2021 01/18/2021 04/06/2019 03/31/2019 03/30/2019 10/20/2018 05/10/2016  Does Patient Have a Medical Advance Directive? Yes Yes Yes Yes Yes Yes Yes  Type of Paramedic of [place];Living will - Healthcare Power of [place];Living will  Does patient want to make changes to medical advance directive? Yes (MAU/Ambulatory/Procedural Areas - Information given) - - - - - No - Patient declined  Copy of [place] in Chart? - - No - copy requested No - copy  requested - - Yes    Current Medications (verified) Outpatient Encounter Medications as of 05/22/2021  Medication Sig   Calcium Polycarbophil (EQ FIBER LAXATIVE PO) Take 1 capsule by mouth daily.    fluticasone (FLONASE) 50 MCG/ACT nasal spray Place 1 spray into both nostrils daily as needed for allergies.    Multiple Vitamins-Minerals (SENIOR MULTIVITAMIN PLUS PO) Take 1 tablet by mouth daily.   olopatadine (PATANOL) 0.1 % ophthalmic solution Place 1 drop into both eyes 2 (two) times daily as needed for allergies.    Omega-3 Fatty Acids (FISH OIL PO) Take 1,300 mg by mouth daily.   rosuvastatin (CRESTOR) 5 MG tablet TAKE 1 TABLET EVERY EVENING FOR CHOLESTEROL   No facility-administered encounter medications on file as of 05/22/2021.    Allergies (verified) Patient has no known allergies.   History: Past Medical History:  Diagnosis Date   Allergy    Arthritis    Atrial fibrillation ([place])    Bilateral vitreous detachment    Chickenpox    Hx of basal cell carcinoma 2018   L malar cheek   Prostate cancer ([place]) 2020   Typical atrial flutter ([place])    Past Surgical History:  Procedure Laterality Date   ATRIAL FIBRILLATION ABLATION N/A 05/10/2016   Procedure: Atrial Fibrillation Ablation;  Surgeon: [name], MD;  Location: [place] [facility];  Service: Cardiovascular;  Laterality: N/A;   CATARACT EXTRACTION, BILATERAL  2016   COLONOSCOPY WITH PROPOFOL N/A 10/20/2018   Procedure: COLONOSCOPY WITH PROPOFOL;  Surgeon: Lin Landsman, MD;  Location: [facility];  Service: Gastroenterology;  Laterality: N/A;   EYE SURGERY  MOHS SURGERY Left 2018   malar cheek, BCC   ROBOT ASSISTED LAPAROSCOPIC RADICAL PROSTATECTOMY N/A 04/06/2019   Procedure: XI ROBOTIC ASSISTED LAPAROSCOPIC RADICAL PROSTATECTOMY;  Surgeon: Billey Co, MD;  Location: [facility];  Service: Urology;  Laterality: N/A;   TONSILLECTOMY  1955   Family History  Problem Relation Age of Onset   Hyperlipidemia  Mother    Heart disease Mother    Hypertension Mother    Hyperlipidemia Father    Heart disease Father    Stroke Father    Hypertension Father    Heart attack Father    Prostate cancer Brother    Atrial fibrillation Brother    Sleep apnea Brother    CAD Brother    Social History   Socioeconomic History   Marital status: Married    Spouse name: Dorian Pod   Number of children: Not on file   Years of education: Not on file   Highest education level: Not on file  Occupational History   Not on file  Tobacco Use   Smoking status: Never   Smokeless tobacco: Former    Types: Chew    Quit date: 1970  Vaping Use   Vaping Use: Never used  Substance and Sexual Activity   Alcohol use: Yes    Alcohol/week: 2.0 standard drinks    Types: 2 Cans of beer per week    Comment: social   Drug use: No   Sexual activity: Yes    Birth control/protection: None  Other Topics Concern   Not on file  Social History Narrative   Married.   3 children. 2 grandchildren.   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   Social Determinants of Health   Financial Resource Strain: Low Risk    Difficulty of Paying Living Expenses: Not hard at all  Food Insecurity: No Food Insecurity   Worried About Charity fundraiser in the Last Year: Never true   [place] in the Last Year: Never true  Transportation Needs: No Transportation Needs   Lack of Transportation (Medical): No   Lack of Transportation (Non-Medical): No  Physical Activity: Sufficiently Active   Days of Exercise per Week: 7 days   Minutes of Exercise per Session: 50 min  Stress: No Stress Concern Present   Feeling of Stress : Not at all  Social Connections: Moderately Integrated   Frequency of Communication with Friends and Family: More than three times a week   Frequency of Social Gatherings with Friends and Family: More than three times a week   Attends Religious Services: Never   Marine scientist or  Organizations: Yes   Attends Music therapist: More than 4 times per year   Marital Status: Married    Tobacco Counseling Counseling given: Not Answered   Clinical Intake:  Pre-visit preparation completed: Yes  Pain : No/denies pain     BMI - recorded: 23.39 Nutritional Status: BMI of 19-24  Normal Nutritional Risks: None Diabetes: No  How often do you need to have someone help you when you read instructions, pamphlets, or other written materials from your doctor or pharmacy?: 1 - Never  Diabetic? No  Interpreter Needed?: No  Information entered by :: Orrin Brigham LPN   Activities of Daily Living In your present state of health, do you have any difficulty performing the following activities: 05/22/2021 03/22/2021  Hearing? Y N  Comment wears hearing aids -  Vision? N N  Difficulty concentrating or making decisions? N N  Walking or climbing stairs? N N  Dressing or bathing? N N  Doing errands, shopping? N N  Preparing Food and eating ? N -  Using the Toilet? N -  In the past six months, have you accidently leaked urine? Y -  Comment does PT for prostatectomy -  Do you have problems with loss of bowel control? N -  Managing your Medications? N -  Managing your Finances? N -  Housekeeping or managing your Housekeeping? N -  Some recent data might be hidden    Patient Care Team: Pleas Koch, NP as PCP - General (Internal Medicine)  Indicate any recent Medical Services you may have received from other than [facility] providers in the past year (date may be approximate).     Assessment:   This is a routine wellness examination for Krystle.  Hearing/Vision screen Hearing Screening - Comments:: Wears hearing aids  Vision Screening - Comments:: Last exam 09/2020, wears reading glasses  Dietary issues and exercise activities discussed: Current Exercise Habits: Home exercise routine;Structured exercise class, Type of exercise: walking;Other - see  comments (spin class), Time (Minutes): 60, Frequency (Times/Week): 4, Weekly Exercise (Minutes/Week): 240, Intensity: Moderate   Goals Addressed             This Visit's Progress    Patient Stated       Would like to drink more water        Depression Screen PHQ 2/9 Scores 05/22/2021 03/22/2021 10/01/2018 07/11/2017 03/23/2016  PHQ - 2 Score 0 0 0 0 0  PHQ- 9 Score - 0 - - -    Fall Risk Fall Risk  05/22/2021 03/22/2021 11/10/2019 10/01/2018 07/11/2017  Falls in the past year? 0 0 1 1 Yes  Comment - - - accidentally tripped -  Number falls in past yr: 0 0 0 0 -  Injury with Fall? 0 0 0 0 -  Risk for fall due to : No Fall Risks - - - -  Follow up Falls prevention discussed - - - -    FALL RISK PREVENTION PERTAINING TO THE HOME:  Any stairs in or around the home? Yes  If so, are there any without handrails? No  Home free of loose throw rugs in walkways, pet beds, electrical cords, etc? Yes  Adequate lighting in your home to reduce risk of falls? Yes   ASSISTIVE DEVICES UTILIZED TO PREVENT FALLS:  Life alert? No  Use of a cane, walker or w/c? No  Grab bars in the bathroom? Yes  Shower chair or bench in shower? No  Elevated toilet seat or a handicapped toilet? Yes   TIMED UP AND GO:  Was the test performed? No .    Cognitive Function: Normal cognitive status assessed by this Nurse Health Advisor. No abnormalities found.          Immunizations Immunization History  Administered Date(s) Administered   Fluad Quad(high Dose 65+) 12/31/2020   H1N1 03/15/2008   Influenza Split 12/25/2006, 01/03/2010, 12/19/2010   Influenza, High Dose Seasonal PF 12/25/2018, 12/17/2019   Influenza,inj,Quad PF,6+ Mos 12/16/2015, 12/07/2016, 12/11/2017   Influenza-Unspecified 01/07/2015, 12/17/2019   PFIZER(Purple Top)SARS-COV-2 Vaccination 05/04/2019, 05/25/2019, 01/12/2020   Pneumococcal Conjugate-13 03/23/2016   Pneumococcal Polysaccharide-23 06/08/2011   Td 04/23/2008   Tdap 06/08/2011,  09/13/2012   Zoster Recombinat (Shingrix) 10/04/2020   Zoster, Live 12/23/2007, 06/08/2011, 09/13/2012    TDAP status: Up to date  Flu Vaccine status: Up to  date  Pneumococcal vaccine status: Up to date  Covid-19 vaccine status: Information provided on how to obtain vaccines.   Qualifies for Shingles Vaccine? Yes   Zostavax completed Yes   Shingrix Completed?: Yes  Screening Tests Health Maintenance  Topic Date Due   Pneumonia Vaccine 1+ Years old (3) 03/23/2017   COVID-19 Vaccine (4 - Booster for Pfizer series) 03/08/2020   Zoster Vaccines- Shingrix (2 of 2) 11/29/2020   TETANUS/TDAP  09/14/2022   COLONOSCOPY (Pts 45-33yrs Insurance coverage will need to be confirmed)  10/19/2025   INFLUENZA VACCINE  Completed   Hepatitis C Screening  Completed   HPV VACCINES  Aged Out    Health Maintenance  Health Maintenance Due  Topic Date Due   Pneumonia Vaccine 29+ Years old (83) 03/23/2017   COVID-19 Vaccine (4 - Booster for Pfizer series) 03/08/2020   Zoster Vaccines- Shingrix (2 of 2) 11/29/2020    Colorectal cancer screening: Type of screening: Colonoscopy. Completed 11/16/18. Repeat every 7 years  Lung Cancer Screening: (Low Dose CT Chest recommended if Age 49-80 years, 30 pack-year currently smoking OR have quit w/in 15years.) does not qualify.     Additional Screening:  Hepatitis C Screening: does qualify; Completed 03/20/16  Vision Screening: Recommended annual ophthalmology exams for early detection of glaucoma and other disorders of the eye. Is the patient up to date with their annual eye exam?  Yes  Who is the provider or what is the name of the office in which the patient attends annual eye exams? Provider information unavailable    Dental Screening: Recommended annual dental exams for proper oral hygiene  Community Resource Referral / Chronic Care Management: CRR required this visit?  No   CCM required this visit?  No      Plan:     I have personally  reviewed and noted the following in the patients chart:   Medical and social history Use of alcohol, tobacco or illicit drugs  Current medications and supplements including opioid prescriptions. Patient is not currently taking opioid prescriptions. Functional ability and status Nutritional status Physical activity Advanced directives List of other physicians Hospitalizations, surgeries, and ER visits in previous 12 months Vitals Screenings to include cognitive, depression, and falls Referrals and appointments  In addition, I have reviewed and discussed with patient certain preventive protocols, quality metrics, and best practice recommendations. A written personalized care plan for preventive services as well as general preventive health recommendations were provided to patient.   Due to this being a telephonic visit, the after visit summary with patients personalized plan was offered to patient via mail or my-chart.  Patient would like to access on my-chart.     Loma Messing, LPN   04/27/5186   Nurse Health Advisor  Nurse Notes:Patient plans on providing updated shingrix information

## 2021-05-22 ENCOUNTER — Ambulatory Visit (INDEPENDENT_AMBULATORY_CARE_PROVIDER_SITE_OTHER): Payer: Medicare Other

## 2021-05-22 VITALS — Ht 70.0 in | Wt 163.0 lb

## 2021-05-22 DIAGNOSIS — Z0000 Encounter for general adult medical examination without abnormal findings: Secondary | ICD-10-CM

## 2021-05-22 NOTE — Patient Instructions (Signed)
Brian Glass , Thank you for taking time to complete your Medicare Wellness Visit. I appreciate your ongoing commitment to your health goals. Please review the following plan we discussed and let me know if I can assist you in the future.   Screening recommendations/referrals: Colonoscopy: up to date, completed 10/20/18, due 10/19/25 Recommended yearly ophthalmology/optometry visit for glaucoma screening and checkup Recommended yearly dental visit for hygiene and checkup  Vaccinations: Influenza vaccine: up to date  Pneumococcal vaccine: up to date  Tdap vaccine: up to date, due 09/14/22 Shingles vaccine: 1st dose completed 10/04/20, please provide update 2nd dose information when available   Covid-19: newest booster available at your local pharmacy  Advanced directives: Please bring a copy of Living Will and/or  for your chart.   Conditions/risks identified: see problem list   Next appointment: Follow up in one year for your annual wellness visit. 05/24/22 @ 12:00pm, this will be a telephone visit  Preventive Care 8 Years and Older, Male Preventive care refers to lifestyle choices and visits with your health care provider that can promote health and wellness. What does preventive care include? A yearly physical exam. This is also called an annual well check. Dental exams once or twice a year. Routine eye exams. Ask your health care provider how often you should have your eyes checked. Personal lifestyle choices, including: Daily care of your teeth and gums. Regular physical activity. Eating a healthy diet. Avoiding tobacco and drug use. Limiting alcohol use. Practicing safe sex. Taking low doses of aspirin every day. Taking vitamin and mineral supplements as recommended by your health care provider. What happens during an annual well check? The services and screenings done by your health care provider during your annual well check will depend on your age,  overall health, lifestyle risk factors, and family history of disease. Counseling  Your health care provider may ask you questions about your: Alcohol use. Tobacco use. Drug use. Emotional well-being. Home and relationship well-being. Sexual activity. Eating habits. History of falls. Memory and ability to understand (cognition). Work and work Statistician. Screening  You may have the following tests or measurements: Height, weight, and BMI. Blood pressure. Lipid and cholesterol levels. These may be checked every 5 years, or more frequently if you are over 10 years old. Skin check. Lung cancer screening. You may have this screening every year starting at age 19 if you have a 30-pack-year history of smoking and currently smoke or have quit within the past 15 years. Fecal occult blood test (FOBT) of the stool. You may have this test every year starting at age 18. Flexible sigmoidoscopy or colonoscopy. You may have a sigmoidoscopy every 5 years or a colonoscopy every 10 years starting at age 80. Prostate cancer screening. Recommendations will vary depending on your family history and other risks. Hepatitis C blood test. Hepatitis B blood test. Sexually transmitted disease (STD) testing. Diabetes screening. This is done by checking your blood sugar (glucose) after you have not eaten for a while (fasting). You may have this done every 1-3 years. Abdominal aortic aneurysm (AAA) screening. You may need this if you are a current or former smoker. Osteoporosis. You may be screened starting at age 55 if you are at high risk. Talk with your health care provider about your test results, treatment options, and if necessary, the need for more tests. Vaccines  Your health care provider may recommend certain vaccines, such as: Influenza vaccine. This is recommended every year. Tetanus, diphtheria, and  acellular pertussis (Tdap, Td) vaccine. You may need a Td booster every 10 years. Zoster vaccine.  You may need this after age 78. Pneumococcal 13-valent conjugate (PCV13) vaccine. One dose is recommended after age 83. Pneumococcal polysaccharide (PPSV23) vaccine. One dose is recommended after age 7. Talk to your health care provider about which screenings and vaccines you need and how often you need them. This information is not intended to replace advice given to you by your health care provider. Make sure you discuss any questions you have with your health care provider. Document Released: 04/01/2015 Document Revised: 11/23/2015 Document Reviewed: 01/04/2015 Elsevier Interactive Patient Education  2017 [place] Prevention in the Home Falls can cause injuries. They can happen to people of all ages. There are many things you can do to make your home safe and to help prevent falls. What can I do on the outside of my home? Regularly fix the edges of walkways and driveways and fix any cracks. Remove anything that might make you trip as you walk through a door, such as a raised step or threshold. Trim any bushes or trees on the path to your home. Use bright outdoor lighting. Clear any walking paths of anything that might make someone trip, such as rocks or tools. Regularly check to see if handrails are loose or broken. Make sure that both sides of any steps have handrails. Any raised decks and porches should have guardrails on the edges. Have any leaves, snow, or ice cleared regularly. Use sand or salt on walking paths during winter. Clean up any spills in your garage right away. This includes oil or grease spills. What can I do in the bathroom? Use night lights. Install grab bars by the toilet and in the tub and shower. Do not use towel bars as grab bars. Use non-skid mats or decals in the tub or shower. If you need to sit down in the shower, use a plastic, non-slip stool. Keep the floor dry. Clean up any water that spills on the floor as soon as it happens. Remove soap  buildup in the tub or shower regularly. Attach bath mats securely with double-sided non-slip rug tape. Do not have throw rugs and other things on the floor that can make you trip. What can I do in the bedroom? Use night lights. Make sure that you have a light by your bed that is easy to reach. Do not use any sheets or blankets that are too big for your bed. They should not hang down onto the floor. Have a firm chair that has side arms. You can use this for support while you get dressed. Do not have throw rugs and other things on the floor that can make you trip. What can I do in the kitchen? Clean up any spills right away. Avoid walking on wet floors. Keep items that you use a lot in easy-to-reach places. If you need to reach something above you, use a strong step stool that has a grab bar. Keep electrical cords out of the way. Do not use floor polish or wax that makes floors slippery. If you must use wax, use non-skid floor wax. Do not have throw rugs and other things on the floor that can make you trip. What can I do with my stairs? Do not leave any items on the stairs. Make sure that there are handrails on both sides of the stairs and use them. Fix handrails that are broken or loose. Make sure that  handrails are as long as the stairways. Check any carpeting to make sure that it is firmly attached to the stairs. Fix any carpet that is loose or worn. Avoid having throw rugs at the top or bottom of the stairs. If you do have throw rugs, attach them to the floor with carpet tape. Make sure that you have a light switch at the top of the stairs and the bottom of the stairs. If you do not have them, ask someone to add them for you. What else can I do to help prevent falls? Wear shoes that: Do not have high heels. Have rubber bottoms. Are comfortable and fit you well. Are closed at the toe. Do not wear sandals. If you use a stepladder: Make sure that it is fully opened. Do not climb a closed  stepladder. Make sure that both sides of the stepladder are locked into place. Ask someone to hold it for you, if possible. Clearly mark and make sure that you can see: Any grab bars or handrails. First and last steps. Where the edge of each step is. Use tools that help you move around (mobility aids) if they are needed. These include: Canes. Walkers. Scooters. Crutches. Turn on the lights when you go into a dark area. Replace any light bulbs as soon as they burn out. Set up your furniture so you have a clear path. Avoid moving your furniture around. If any of your floors are uneven, fix them. If there are any pets around you, be aware of where they are. Review your medicines with your doctor. Some medicines can make you feel dizzy. This can increase your chance of falling. Ask your doctor what other things that you can do to help prevent falls. This information is not intended to replace advice given to you by your health care provider. Make sure you discuss any questions you have with your health care provider. Document Released: 12/30/2008 Document Revised: 08/11/2015 Document Reviewed: 04/09/2014 Elsevier Interactive Patient Education  2017 [employer].

## 2021-06-05 ENCOUNTER — Ambulatory Visit: Payer: Medicare Other | Admitting: Physical Therapy

## 2021-06-08 ENCOUNTER — Other Ambulatory Visit: Payer: Self-pay

## 2021-06-08 ENCOUNTER — Ambulatory Visit: Payer: Medicare Other | Admitting: Physical Therapy

## 2021-06-08 DIAGNOSIS — M6208 Separation of muscle (nontraumatic), other site: Secondary | ICD-10-CM

## 2021-06-08 DIAGNOSIS — M533 Sacrococcygeal disorders, not elsewhere classified: Secondary | ICD-10-CM | POA: Diagnosis not present

## 2021-06-08 DIAGNOSIS — R278 Other lack of coordination: Secondary | ICD-10-CM

## 2021-06-08 DIAGNOSIS — R2689 Other abnormalities of gait and mobility: Secondary | ICD-10-CM | POA: Diagnosis not present

## 2021-06-08 DIAGNOSIS — M2170 Unequal limb length (acquired), unspecified site: Secondary | ICD-10-CM | POA: Diagnosis not present

## 2021-06-08 NOTE — Therapy (Signed)
/ Discharge Summary Roland ?[facility] ?[place], [place], [address] ?Phone: [phone]   Fax:  [phone] ? ?Physical Therapy Treatment /Discharge Summary  ? ?Patient Details  ?Name: Brian Glass ?MRN: [id number] ?Date of Birth: 09/29/1950 ?Referring Provider (PT): [place]y  ? ? ?Encounter Date: 06/08/2021 ? ? PT End of Session - 06/08/21 1415   ? ? Visit Number 9   ? Number of Visits 10   ? Date for PT Re-Evaluation 06/22/21   eval 01/18/21  ? PT Start Time 1300   ? PT Stop Time 1324   ? PT Time Calculation (min) 24 min   ? Activity Tolerance Patient tolerated treatment well   ? Behavior During Therapy [facility] for tasks assessed/performed   ? ?  ?  ? ?  ? ? ?Past Medical History:  ?Diagnosis Date  ? Allergy   ? Arthritis   ? Atrial fibrillation ([place])   ? Bilateral vitreous detachment   ? Chickenpox   ? Hx of basal cell carcinoma 2018  ? L malar cheek  ? Prostate cancer ([place]) 2020  ? Typical atrial flutter ([place])   ? ? ?Past Surgical History:  ?Procedure Laterality Date  ? ATRIAL FIBRILLATION ABLATION N/A 05/10/2016  ? Procedure: Atrial Fibrillation Ablation;  Surgeon: [name], MD;  Location: [facility];  Service: Cardiovascular;  Laterality: N/A;  ? CATARACT EXTRACTION, BILATERAL  2016  ? COLONOSCOPY WITH PROPOFOL N/A 10/20/2018  ? Procedure: COLONOSCOPY WITH PROPOFOL;  Surgeon: Lin Landsman, MD;  Location: [facility];  Service: Gastroenterology;  Laterality: N/A;  ? EYE SURGERY    ? MOHS SURGERY Left 2018  ? malar cheek, BCC  ? ROBOT ASSISTED LAPAROSCOPIC RADICAL PROSTATECTOMY N/A 04/06/2019  ? Procedure: XI ROBOTIC ASSISTED LAPAROSCOPIC RADICAL PROSTATECTOMY;  Surgeon: Billey Co, MD;  Location: [facility];  Service: Urology;  Laterality: N/A;  ? TONSILLECTOMY  1955  ? ? ?There were no vitals filed for this visit. ? ? Subjective Assessment - 06/08/21 1403   ? ? Subjective Pt noticed he slept through the night for one night across  one week. Pt has had good and bad days with daytime leakage. Across 3 weeks, pt had one day where he leaked 8 x. The other days, pt had leaked 2-3 x day. But across 3-4 days a aweek, pt has no leakage.   ? Pertinent History Prostate Cancer with RALP surgery, MOHS surgery for skin cancer   ? Patient Stated Goals go without a pad   ? ?  ?  ? ?  ? ? ? ? ? [facility] PT Assessment - 06/08/21 1452   ? ?  ? Coordination  ? Coordination and Movement Description seated quick ontractions, rectal mm dominance with a few reps, proper lengthening   ? ?  ?  ? ?  ? ? ? ? ? ? ? ? ? ? ? ? ? ? ? ? [facility] Adult PT Treatment/Exercise - 06/08/21 1452   ? ?  ? Therapeutic Activites   ? Other Therapeutic Activities reassesed goals, discussed d/c and strategies to maintain routine for kegels  and HEP   ?  ? Neuro Re-ed   ? Neuro Re-ed Details  cued for less rectal mm overuse   ? ?  ?  ? ?  ? ? ? ? ? ? ? ? ? ? ? ? PT Short Term Goals - 04/14/21 0942   ? ?  ? PT SHORT TERM  GOAL #1  ? Title pt will demo correct technique with deep core levle 1 and 2 in order to increase intraabdominal pressure for optimal lengtehning of pelvic floor to minimize delayed urination and minimizing incontinence   ? Time 1   ? Period Weeks   ? Status Achieved  ?  ? PT SHORT TERM GOAL #2  ? Title Pt will demo decreased 3 to < 1  fingers width along linea alba in order to strengtehn abdominal pelvic tensigrity for better outcomes with kegel strengthening and increase postural stability.   ? Time 1   ? Period Days   ? Status Achieved   ?  ? PT SHORT TERM GOAL #3  ? Title Pt will demo proper body mechanics to minimize straining abdominopelvic area and optimize pelvic floor   ? Time 1   ? Period Weeks   ? Status Achieved   ? ?  ?  ? ?  ? ? ? ? PT Long Term Goals - 06/08/21 1424   ? ?  ? PT LONG TERM GOAL #1  ? Title Pt's PCP will get contacted about sleep study test given pt's risk factors of OSA ( nocturia, Afill)   ? Time 5   ? Period Weeks   ? Status Achieved   ? Target  Date 02/22/21   ?  ? PT LONG TERM GOAL #2  ? Title Pt will decrease pad from 1 to zero at home across 1 week in order to improve QOL   ? Time 10   ? Period Weeks   ? Status Achieved   ? Target Date 03/29/21   ?  ? PT LONG TERM GOAL #3  ? Title Pt will demo proper pelvic coordination/ contraction 3 sec , 3 reps in seated position to improve continence   ? Time 8   ? Period Weeks   ? Status Achieved   ? Target Date 03/15/21   ?  ? PT LONG TERM GOAL #4  ? Title Pt will be ablet o get up from sit to stand without L glut pain after watching a movie and getting up at night to pee   ? Time 10   ? Period Weeks   ? Status Achieved   ? Target Date 03/29/21   ?  ? PT LONG TERM GOAL #5  ? Title Pt will improve bladder irritant to water ratio from 15 fl oz of coffee to 16-20 fl oz of water to 15 fl oz of coffee to 30 fl oz of water in order to improve bladder health and urinary function   ? Time 2   ? Period Weeks   ? Status Achieved   ? Target Date 02/01/21   ?  ? PT LONG TERM GOAL #6  ? Title Pt will increase hip abduction on R from 3/5 to > 4/5 in  order to adjust for shorter R leg and to decrease L glut pain and to continue riding bike in spin class with less risk for injuries  (03/15/21: 5/5)   ? Time 8   ? Period Weeks   ? Status Achieved   ?  ? PT LONG TERM GOAL #7  ? Title Pt will report no longer having urgency to go 30 min after urination with use of urgency suppression technique   ? Time 4   ? Period Weeks   ? Status Achieved   ? Target Date 06/14/21   ? ?  ?  ? ?  ? ? ? ? ? ? ? ?  Plan - 06/08/21 1451   ? ? Clinical Impression Statement Pt has achieved 100% of STG and LTG across 9 visits. Pt has reported for the past 3 months, pt has had no leakage for 3-4 days each week.  Across 3 weeks, pt had one day where he leaked 8 x while other days, pt leaked 2-3 x a day. Pt has weaned off his pads. Pt has been able to sleep through the night one time without nocturia. Nocturia is decreasing more and more.  ? ?Pt also has  returned to biking and hiking without issues. ? ?L glut pain resolved. Pt is able to sit and watch movies without this pain.  ? ?Pt's shorter leg on RLE was corrected which helped to level his pelvic girdle and spine which in turn, improved his IAP system. Pt's hip strength  improved. Pt also has improved his coordination with deep core mm  which improved his IAP system for continence. Pt' s posture is more upright which improves his IAP system. Pt was educated to maintain HEP and kegel program to yield greater outcome.  ? ?Pt is ready for d/c.    ? Examination-Activity Limitations Continence;Toileting;Transfers   ? Stability/Clinical Decision Making Evolving/Moderate complexity   ? Rehab Potential Good   ? PT Frequency 1x / week   10  ? PT Duration Other (comment)   10  ? PT Treatment/Interventions Neuromuscular re-education;Cognitive remediation;Therapeutic exercise;Patient/family education;Manual techniques;Moist Heat;Therapeutic activities;Functional mobility training;Stair training;ADLs/Self Care Home Management;Taping;Splinting;Energy conservation;Balance training;Scar mobilization;Spinal Manipulations   ? Consulted and Agree with Plan of Care Patient   ? ?  ?  ? ?  ? ? ?Patient will benefit from skilled therapeutic intervention in order to improve the following deficits and impairments:  Increased muscle spasms, Postural dysfunction, Improper body mechanics, Decreased strength, Decreased safety awareness, Decreased endurance, Hypomobility ? ?Visit Diagnosis: ?Sacrococcygeal disorders, not elsewhere classified ? ?Other abnormalities of gait and mobility ? ?Other lack of coordination ? ?Diastasis recti ? ?Unequal leg length ? ? ? ? ?Problem List ?Patient Active Problem List  ? Diagnosis Date Noted  ? Thrombocytopenia ([place]) 11/10/2019  ? History of prostate cancer 04/06/2019  ? Encounter for screening colonoscopy 10/01/2018  ? Wart 11/28/2017  ? Medicare annual wellness visit, subsequent 03/23/2016  ? Paroxysmal  atrial fibrillation ([place]) 11/29/2015  ? History of prostatectomy 11/29/2015  ? Hyperlipidemia 03/24/2015  ? ? ?Jerl Mina, PT ?06/08/2021, 2:55 PM ? ?[place] [facility]

## 2021-06-13 ENCOUNTER — Other Ambulatory Visit: Payer: Medicare Other

## 2021-06-14 ENCOUNTER — Other Ambulatory Visit: Payer: Self-pay

## 2021-06-14 DIAGNOSIS — C61 Malignant neoplasm of prostate: Secondary | ICD-10-CM

## 2021-06-15 ENCOUNTER — Other Ambulatory Visit: Payer: Medicare Other

## 2021-06-15 DIAGNOSIS — C61 Malignant neoplasm of prostate: Secondary | ICD-10-CM | POA: Diagnosis not present

## 2021-06-16 LAB — PSA: Prostate Specific Ag, Serum: <0.1 ng/mL (ref 0.0–4.0)

## 2021-06-21 ENCOUNTER — Ambulatory Visit: Payer: Medicare Other | Admitting: Urology

## 2021-07-06 ENCOUNTER — Ambulatory Visit (INDEPENDENT_AMBULATORY_CARE_PROVIDER_SITE_OTHER): Payer: Medicare Other | Admitting: Urology

## 2021-07-06 ENCOUNTER — Encounter: Payer: Self-pay | Admitting: Urology

## 2021-07-06 VITALS — BP 107/64 | HR 70 | Ht 70.0 in | Wt 171.0 lb

## 2021-07-06 DIAGNOSIS — N529 Male erectile dysfunction, unspecified: Secondary | ICD-10-CM

## 2021-07-06 DIAGNOSIS — C61 Malignant neoplasm of prostate: Secondary | ICD-10-CM

## 2021-07-06 DIAGNOSIS — Z8546 Personal history of malignant neoplasm of prostate: Secondary | ICD-10-CM | POA: Diagnosis not present

## 2021-07-06 MED ORDER — TADALAFIL 20 MG PO TABS: 20.0000 mg | ORAL_TABLET | Freq: Every day | ORAL | 11 refills | Status: DC | PRN

## 2021-07-06 NOTE — Progress Notes (Signed)
? ?  07/06/2021 ?1:14 PM  ? ?Brian Glass ?08-02-1950 ?[id number] ? ?Reason for visit: Follow up prostate cancer, erectile dysfunction ? ?HPI: ?I saw Brian Glass back in urology clinic for prostate cancer follow-up after undergoing robotic prostatectomy for favorable intermediate risk prostate cancer on 04/06/2019.  Final pathology showed Gleason score 3+4 =7 disease in 25% of the prostate with negative margins, stage pT2Nx.  A bilateral nerve sparing procedure was performed ?  ?His first post-op PSA on 06/15/2019 was <0.1.  PSA remains <0.1 on 06/15/21.  We discussed the need for ongoing PSA monitoring on a yearly basis. ? ?At our last visit he was having some incontinence with leakage of a small amount of urine without realization.  This was not stress or urge incontinence, and did not always happen when he had a full bladder.  He urinates with a good stream.  At that point I recommended referral to pelvic floor physical therapy, and he has been working with them over the last few months.  He reports significant improvement after working with PT and the leakage.  He is wearing a pad really just for safety when he goes out. ? ?In terms of erections, he reports no significant improvement with either the Cialis 20 mg daily or Viagra 100 mg on demand.  He gets a partial semierection with sexual activity, but not sufficient for intercourse.  I encouraged him to consider trying the Cialis 20 mg daily again now that he is over 2 years out from surgery.  We discussed other options including vacuum erection device, penile injections, or penile prosthesis, but he is not interested in these more invasive treatments. ? ?RTC 1 year PSA prior ? ? ?Billey Co, MD ? ?[place] ?[address] ?[place], [place] [address] ?[phone] ? ?

## 2021-07-06 NOTE — Addendum Note (Signed)
Addended by: Despina Hidden on: 07/06/2021 01:35 PM ? ? Modules accepted: Orders ? ?

## 2021-10-25 ENCOUNTER — Encounter: Payer: Self-pay | Admitting: Family Medicine

## 2021-10-25 ENCOUNTER — Ambulatory Visit (INDEPENDENT_AMBULATORY_CARE_PROVIDER_SITE_OTHER): Payer: Medicare Other | Admitting: Family Medicine

## 2021-10-25 VITALS — BP 100/70 | HR 59 | Temp 97.1°F | Wt 169.2 lb

## 2021-10-25 DIAGNOSIS — H1013 Acute atopic conjunctivitis, bilateral: Secondary | ICD-10-CM | POA: Diagnosis not present

## 2021-10-25 DIAGNOSIS — H1033 Unspecified acute conjunctivitis, bilateral: Secondary | ICD-10-CM

## 2021-10-25 MED ORDER — POLYMYXIN B-TRIMETHOPRIM 10000-0.1 UNIT/ML-% OP SOLN: 1.0000 [drp] | OPHTHALMIC | 0 refills | Status: DC

## 2021-10-25 MED ORDER — CROMOLYN SODIUM 4 % OP SOLN: 1.0000 [drp] | Freq: Four times a day (QID) | OPHTHALMIC | 12 refills | Status: DC

## 2021-10-25 NOTE — Progress Notes (Signed)
Subjective:     Brian Glass is a 71 y.o. male presenting for Eye Burn (Started in R eye on Monday and now has it in L as well. "Happens in waves.")     HPI  #eye itchy - bilateral - started in the right and yesterday started in the left - was exercising on Monday indoors and not sure if got sweat  - did not get anything in the eye - initially felt like it had something in the eye - tried rinsing the eye - with cup of water w/o improvement - mild redness - yesterday - right eye was crusted over and had to wash it to open the eye  Using olopatadine and flonase  Review of Systems  Constitutional:  Negative for chills and fever.  HENT:  Negative for congestion, rhinorrhea and sinus pressure.   Eyes:  Positive for discharge, redness and itching. Negative for visual disturbance.  Respiratory:  Negative for cough and shortness of breath.      Social History   Tobacco Use  Smoking Status Never  Smokeless Tobacco Former   Types: Chew   Quit date: 1970        Objective:    BP Readings from Last 3 Encounters:  10/25/21 100/70  07/06/21 107/64  03/22/21 104/60   Wt Readings from Last 3 Encounters:  10/25/21 169 lb 4 oz (76.8 kg)  07/06/21 171 lb (77.6 kg)  05/22/21 163 lb (73.9 kg)    BP 100/70   Pulse (!) 59   Temp (!) 97.1 F (36.2 C) (Temporal)   Wt 169 lb 4 oz (76.8 kg)   SpO2 99%   BMI 24.28 kg/m    Physical Exam Constitutional:      Appearance: Normal appearance. He is not ill-appearing or diaphoretic.  HENT:     Right Ear: External ear normal.     Left Ear: External ear normal.     Nose: Nose normal.     Mouth/Throat:     Mouth: Mucous membranes are moist.  Eyes:     General: Lids are normal. No scleral icterus.    Extraocular Movements: Extraocular movements intact.     Conjunctiva/sclera:     Right eye: Right conjunctiva is injected.     Left eye: Left conjunctiva is injected.  Cardiovascular:     Rate and Rhythm: Normal rate and  regular rhythm.  Pulmonary:     Effort: Pulmonary effort is normal. No respiratory distress.     Breath sounds: Normal breath sounds. No wheezing.  Musculoskeletal:     Cervical back: Neck supple.  Skin:    General: Skin is warm and dry.  Neurological:     Mental Status: He is alert. Mental status is at baseline.  Psychiatric:        Mood and Affect: Mood normal.           Assessment & Plan:   Problem List Items Addressed This Visit   None Visit Diagnoses     Allergic conjunctivitis of both eyes    -  Primary   Relevant Medications   cromolyn (OPTICROM) 4 % ophthalmic solution   Acute conjunctivitis of both eyes, unspecified acute conjunctivitis type       Relevant Medications   trimethoprim-polymyxin b (POLYTRIM) ophthalmic solution      No known injury to the eye, discussed given itchy sensation in both eyes unlikely to be a corneal injury.  Symptoms are also intermittent per patient.  He does have  allergies, suggested that he start a daily allergy pill.  Does seem most consistent with allergic dry eyes, however, he did notes severe drainage this morning in the right eye.  Discussed doing treatment for dry eyes will switch to cromolyn as patient has had limited response to antihistamine eyedrop.  Antibiotics sent in if symptoms worsen or do not improve.  Discussed dry eyes can be difficult to treat and it may take up to 1 to 2 weeks to notice an improvement with eyedrops.  Update if worsening vision or any other changes  Return if symptoms worsen or fail to improve.  Lesleigh Noe, MD

## 2021-10-25 NOTE — Patient Instructions (Addendum)
Itchy eyes - likely allergies - can continue the olopatadine or switch to the prescription drop -- both can take 5-10 days to show improvement - Try daily allergy pill - Claritin, zyrtec, allegra - store brand ok  Start antibiotics if no improvement or worsening  Update if any blurry vision or worsening eye pain

## 2021-11-08 ENCOUNTER — Encounter: Payer: Self-pay | Admitting: Dermatology

## 2021-11-08 ENCOUNTER — Ambulatory Visit (INDEPENDENT_AMBULATORY_CARE_PROVIDER_SITE_OTHER): Payer: Medicare Other | Admitting: Dermatology

## 2021-11-08 DIAGNOSIS — Z85828 Personal history of other malignant neoplasm of skin: Secondary | ICD-10-CM

## 2021-11-08 DIAGNOSIS — D229 Melanocytic nevi, unspecified: Secondary | ICD-10-CM

## 2021-11-08 DIAGNOSIS — C4491 Basal cell carcinoma of skin, unspecified: Secondary | ICD-10-CM

## 2021-11-08 DIAGNOSIS — D23122 Other benign neoplasm of skin of left lower eyelid, including canthus: Secondary | ICD-10-CM | POA: Diagnosis not present

## 2021-11-08 DIAGNOSIS — L814 Other melanin hyperpigmentation: Secondary | ICD-10-CM

## 2021-11-08 DIAGNOSIS — L578 Other skin changes due to chronic exposure to nonionizing radiation: Secondary | ICD-10-CM

## 2021-11-08 DIAGNOSIS — L720 Epidermal cyst: Secondary | ICD-10-CM | POA: Diagnosis not present

## 2021-11-08 DIAGNOSIS — C44319 Basal cell carcinoma of skin of other parts of face: Secondary | ICD-10-CM | POA: Diagnosis not present

## 2021-11-08 DIAGNOSIS — L821 Other seborrheic keratosis: Secondary | ICD-10-CM

## 2021-11-08 DIAGNOSIS — D1800 Hemangioma unspecified site: Secondary | ICD-10-CM

## 2021-11-08 DIAGNOSIS — D492 Neoplasm of unspecified behavior of bone, soft tissue, and skin: Secondary | ICD-10-CM

## 2021-11-08 DIAGNOSIS — Z1283 Encounter for screening for malignant neoplasm of skin: Secondary | ICD-10-CM

## 2021-11-08 HISTORY — DX: Basal cell carcinoma of skin, unspecified: C44.91

## 2021-11-08 NOTE — Patient Instructions (Addendum)
Wound Care Instructions  Cleanse wound gently with soap and water once a day then pat dry with clean gauze. Apply a thin coat of Petrolatum (petroleum jelly, "Vaseline") over the wound (unless you have an allergy to this). We recommend that you use a new, sterile tube of Vaseline. Do not pick or remove scabs. Do not remove the yellow or white "healing tissue" from the base of the wound.  Cover the wound with fresh, clean, nonstick gauze and secure with paper tape. You may use Band-Aids in place of gauze and tape if the wound is small enough, but would recommend trimming much of the tape off as there is often too much. Sometimes Band-Aids can irritate the skin.  You should call the office for your biopsy report after 1 week if you have not already been contacted.  If you experience any problems, such as abnormal amounts of bleeding, swelling, significant bruising, significant pain, or evidence of infection, please call the office immediately.  FOR ADULT SURGERY PATIENTS: If you need something for pain relief you may take 1 extra strength Tylenol (acetaminophen) AND 2 Ibuprofen ('200mg'$  each) together every 4 hours as needed for pain. (do not take these if you are allergic to them or if you have a reason you should not take them.) Typically, you may only need pain medication for 1 to 3 days.   Recommend Niacinamide or Nicotinamide '500mg'$  twice per day to lower risk of non-melanoma skin cancer by approximately 25%. This is usually available at [employer].    Recommend taking Heliocare sun protection supplement daily in sunny weather for additional sun protection. For maximum protection on the sunniest days, you can take up to 2 capsules of regular Heliocare OR take 1 capsule of Heliocare Ultra. For prolonged exposure (such as a full day in the sun), you can repeat your dose of the supplement 4 hours after your first dose. Heliocare can be purchased at [employer], at some [facility] or at  [email or web address].     Recommend daily broad spectrum sunscreen SPF 30+ to sun-exposed areas, reapply every 2 hours as needed. Call for new or changing lesions.  Staying in the shade or wearing long sleeves, sun glasses (UVA+UVB protection) and wide brim hats (4-inch brim around the entire circumference of the hat) are also recommended for sun protection.    Melanoma ABCDEs  Melanoma is the most dangerous type of skin cancer, and is the leading cause of death from skin disease.  You are more likely to develop melanoma if you: Have light-colored skin, light-colored eyes, or red or blond hair Spend a lot of time in the sun Tan regularly, either outdoors or in a tanning bed Have had blistering sunburns, especially during childhood Have a close family member who has had a melanoma Have atypical moles or large birthmarks  Early detection of melanoma is key since treatment is typically straightforward and cure rates are extremely high if we catch it early.   The first sign of melanoma is often a change in a mole or a new dark spot.  The ABCDE system is a way of remembering the signs of melanoma.  A for asymmetry:  The two halves do not match. B for border:  The edges of the growth are irregular. C for color:  A mixture of colors are present instead of an even brown color. D for diameter:  Melanomas are usually (but not always) greater than 95m - the size of a pencil eraser. E for  evolution:  The spot keeps changing in size, shape, and color.  Please check your skin once per month between visits. You can use a small mirror in front and a large mirror behind you to keep an eye on the back side or your body.   If you see any new or changing lesions before your next follow-up, please call to schedule a visit.  Please continue daily skin protection including broad spectrum sunscreen SPF 30+ to sun-exposed areas, reapplying every 2 hours as needed when you're outdoors.   Staying in the shade or  wearing long sleeves, sun glasses (UVA+UVB protection) and wide brim hats (4-inch brim around the entire circumference of the hat) are also recommended for sun protection.     Due to recent changes in healthcare laws, you may see results of your pathology and/or laboratory studies on MyChart before the doctors have had a chance to review them. We understand that in some cases there may be results that are confusing or concerning to you. Please understand that not all results are received at the same time and often the doctors may need to interpret multiple results in order to provide you with the best plan of care or course of treatment. Therefore, we ask that you please give [place] 2 business days to thoroughly review all your results before contacting the office for clarification. Should we see a critical lab result, you will be contacted sooner.   If You Need Anything After Your Visit  If you have any questions or concerns for your doctor, please call our main line at [phone] and press option 4 to reach your doctor's medical assistant. If no one answers, please leave a voicemail as directed and we will return your call as soon as possible. Messages left after 4 pm will be answered the following business day.   You may also send [place] a message via [place]. We typically respond to MyChart messages within 1-2 business days.  For prescription refills, please ask your pharmacy to contact our office. Our fax number is [phone].  If you have an urgent issue when the clinic is closed that cannot wait until the next business day, you can page your doctor at the number below.    Please note that while we do our best to be available for urgent issues outside of office hours, we are not available 24/7.   If you have an urgent issue and are unable to reach [place], you may choose to seek medical care at your doctor's office, retail clinic, urgent care center, or emergency room.  If you have a medical  emergency, please immediately call 911 or go to the emergency department.  Pager Numbers  - Dr. Nehemiah Massed: [phone]  - Dr. Laurence Ferrari: [phone]  - Dr. Nicole Kindred: [phone]  In the event of inclement weather, please call our main line at [phone] for an update on the status of any delays or closures.  Dermatology Medication Tips: Please keep the boxes that topical medications come in in order to help keep track of the instructions about where and how to use these. Pharmacies typically print the medication instructions only on the boxes and not directly on the medication tubes.   If your medication is too expensive, please contact our office at [phone] option 4 or send [place] a message through [place].   We are unable to tell what your co-pay for medications will be in advance as this is different depending on your insurance coverage. However, we may be  able to find a substitute medication at lower cost or fill out paperwork to get insurance to cover a needed medication.   If a prior authorization is required to get your medication covered by your insurance company, please allow [place] 1-2 business days to complete this process.  Drug prices often vary depending on where the prescription is filled and some pharmacies may offer cheaper prices.  The website www.goodrx.com contains coupons for medications through different pharmacies. The prices here do not account for what the cost may be with help from insurance (it may be cheaper with your insurance), but the website can give you the price if you did not use any insurance.  - You can print the associated coupon and take it with your prescription to the pharmacy.  - You may also stop by our office during regular business hours and pick up a GoodRx coupon card.  - If you need your prescription sent electronically to a different pharmacy, notify our office through [facility] or by phone at [phone] option 4.     Si Usted  Necesita Algo Despus de Su Visita  Tambin puede enviarnos un mensaje a travs de Pharmacist, community. Por lo general respondemos a los mensajes de MyChart en el transcurso de 1 a 2 das hbiles.  Para renovar recetas, por favor pida a su farmacia que se ponga en contacto con nuestra oficina. Harland Dingwall de fax es [place] [phone].  Si tiene un asunto urgente cuando la clnica est cerrada y que no puede esperar hasta el siguiente da hbil, puede llamar/localizar a su doctor(a) al nmero que aparece a continuacin.   Por favor, tenga en cuenta que aunque hacemos todo lo posible para estar disponibles para asuntos urgentes fuera del horario de [place], no estamos disponibles las 24 horas del da, los 7 das de la [place].   Si tiene un problema urgente y no puede comunicarse con nosotros, puede optar por buscar atencin mdica  en el consultorio de su doctor(a), en una clnica privada, en un centro de atencin urgente o en una sala de emergencias.  Si tiene Engineering geologist, por favor llame inmediatamente al 911 o vaya a la sala de emergencias.  Nmeros de bper  - Dr. Nehemiah Massed: [phone]  - Dra. Moye: [phone]  - Dra. Nicole Kindred: [phone]  En caso de inclemencias del [place], por favor llame a Johnsie Kindred principal al [phone] para una actualizacin sobre el [place] de cualquier retraso o cierre.  Consejos para la medicacin en dermatologa: Por favor, guarde las cajas en las que vienen los medicamentos de uso tpico para ayudarle a seguir las instrucciones sobre dnde y cmo usarlos. Las farmacias generalmente imprimen las instrucciones del medicamento slo en las cajas y no directamente en los tubos del [place].   Si su medicamento es muy caro, por favor, pngase en contacto con Zigmund Daniel llamando al [phone] y presione la opcin 4 o envenos un mensaje a travs de Pharmacist, community.   No podemos decirle cul ser su copago por los medicamentos por adelantado ya que esto es  diferente dependiendo de la cobertura de su seguro. Sin embargo, es posible que podamos encontrar un medicamento sustituto a Electrical engineer un formulario para que el seguro cubra el medicamento que se considera necesario.   Si se requiere una autorizacin previa para que su compaa de seguros [place] su medicamento, por favor permtanos de 1 a 2 das hbiles para completar este proceso.  Los precios de los medicamentos varan con  frecuencia dependiendo del lugar de dnde se surte la receta y alguna farmacias pueden ofrecer precios ms baratos.  El sitio web www.goodrx.com tiene cupones para medicamentos de Airline pilot. Los precios aqu no tienen en cuenta lo que podra costar con la ayuda del seguro (puede ser ms barato con su seguro), pero el sitio web puede darle el precio si no utiliz Research scientist (physical sciences).  - Puede imprimir el cupn correspondiente y llevarlo con su receta a la farmacia.  - Tambin puede pasar por nuestra oficina durante el horario de atencin regular y Charity fundraiser una tarjeta de cupones de GoodRx.  - Si necesita que su receta se enve electrnicamente a una farmacia diferente, informe a nuestra oficina a travs de MyChart de [place] o por telfono llamando al [phone] y presione la opcin 4.

## 2021-11-08 NOTE — Progress Notes (Signed)
Follow-Up Visit   Subjective  Brian Glass is a 71 y.o. male who presents for the following: Annual Exam (HxBCC. HxAK's. Areas of concern on face, scalp, right axilla).  The patient presents for Total-Body Skin Exam (TBSE) for skin cancer screening and mole check.  The patient has spots, moles and lesions to be evaluated, some may be new or changing and the patient has concerns that these could be cancer.   The following portions of the chart were reviewed this encounter and updated as appropriate:  Tobacco  Allergies  Meds  Problems  Med Hx  Surg Hx  Fam Hx      Review of Systems: No other skin or systemic complaints except as noted in HPI or Assessment and Plan.   Objective  Well appearing patient in no apparent distress; mood and affect are within normal limits.  A full examination was performed including scalp, head, eyes, ears, nose, lips, neck, chest, axillae, abdomen, back, buttocks, bilateral upper extremities, bilateral lower extremities, hands, feet, fingers, toes, fingernails, and toenails. All findings within normal limits unless otherwise noted below.  Left Lower Eyelid 0.4 cm translucent papule     Right Medial Cheek 0.5 cm pink papule      Assessment & Plan   History of Basal Cell Carcinoma of the Skin. Left malar cheek, 2018. - No evidence of recurrence today - Recommend regular full body skin exams - Recommend daily broad spectrum sunscreen SPF 30+ to sun-exposed areas, reapply every 2 hours as needed.  - Call if any new or changing lesions are noted between office visits  Lentigines - Scattered tan macules - Due to sun exposure - Benign-appearing, observe - Recommend daily broad spectrum sunscreen SPF 30+ to sun-exposed areas, reapply every 2 hours as needed. - Call for any changes  Seborrheic Keratoses - Stuck-on, waxy, tan-brown papules and/or plaques  - Benign-appearing - Discussed benign etiology and prognosis. - Observe - Call for  any changes  Melanocytic Nevi - Tan-brown and/or pink-flesh-colored symmetric macules and papules - Benign appearing on exam today - Observation - Call clinic for new or changing moles - Recommend daily use of broad spectrum spf 30+ sunscreen to sun-exposed areas.   Hemangiomas - Red papules - Discussed benign nature - Observe - Call for any changes  Actinic Damage - Chronic condition, secondary to cumulative UV/sun exposure - diffuse scaly erythematous macules with underlying dyspigmentation - Recommend daily broad spectrum sunscreen SPF 30+ to sun-exposed areas, reapply every 2 hours as needed.  - Staying in the shade or wearing long sleeves, sun glasses (UVA+UVB protection) and wide brim hats (4-inch brim around the entire circumference of the hat) are also recommended for sun protection.  - Call for new or changing lesions.  Skin cancer screening performed today.  Milia - tiny firm white papules - type of cyst - benign - may be extracted if symptomatic - observe   Neoplasm of skin (2) Left Lower Eyelid  Skin / nail biopsy Type of biopsy: incisional   Informed consent: discussed and consent obtained   Timeout: patient name, date of birth, surgical site, and procedure verified   Procedure prep:  Patient was prepped and draped in usual sterile fashion Prep type:  Isopropyl alcohol Anesthesia: the lesion was anesthetized in a standard fashion   Anesthesia comment:  Area prepped with alcohol Anesthetic:  1% lidocaine w/ epinephrine 1-100,000 buffered w/ 8.4% NaHCO3 Instrument used: scissors   Suture size:  6-0 Suture type: nylon   Hemostasis  achieved with: suture   Outcome: patient tolerated procedure well   Post-procedure details: wound care instructions given   Post-procedure details comment:  Ointment and small bandage applied Additional details:  Wound approximated with #2  6-0 nylon sutures. RTC 1 week for suture removal.  Specimen 1 - Surgical  pathology Differential Diagnosis: R/O hidrocystoma > BCC  Check Margins: No  Right Medial Cheek  Skin / nail biopsy Type of biopsy: tangential   Informed consent: discussed and consent obtained   Anesthesia: the lesion was anesthetized in a standard fashion   Anesthesia comment:  Area prepped with alcohol Anesthetic:  1% lidocaine w/ epinephrine 1-100,000 buffered w/ 8.4% NaHCO3 Instrument used: flexible razor blade   Hemostasis achieved with: pressure, aluminum chloride and electrodesiccation   Outcome: patient tolerated procedure well   Post-procedure details: wound care instructions given   Post-procedure details comment:  Ointment and small bandage applied  Specimen 2 - Surgical pathology Differential Diagnosis: R/O BCC  Check Margins: No  Plan Mohs at [facility] with Dr. Winifred Olive pending pathology results.    Return in about 1 year (around 11/09/2022) for TBSE, HxBCC; Suture Removal in 1 week.  I, Emelia Salisbury, CMA, am acting as scribe for Forest Gleason, MD.  Documentation: I have reviewed the above documentation for accuracy and completeness, and I agree with the above.  Forest Gleason, MD

## 2021-11-13 ENCOUNTER — Encounter: Payer: Self-pay | Admitting: Dermatology

## 2021-11-13 ENCOUNTER — Telehealth: Payer: Self-pay

## 2021-11-13 DIAGNOSIS — C44319 Basal cell carcinoma of skin of other parts of face: Secondary | ICD-10-CM

## 2021-11-13 NOTE — Telephone Encounter (Signed)
-----   Message from Alfonso Patten, MD sent at 11/13/2021 10:05 AM EDT ----- 1. 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 Skin , right medial cheek BASAL CELL CARCINOMA, NODULAR PATTERN --> Mohs surgery at [facility]  MAs please call and refer. Please also switch FBSE to 6 months instead of 1 year. Let me know if he has questions. Thank you!

## 2021-11-15 ENCOUNTER — Ambulatory Visit (INDEPENDENT_AMBULATORY_CARE_PROVIDER_SITE_OTHER): Payer: Medicare Other | Admitting: Dermatology

## 2021-11-15 DIAGNOSIS — L570 Actinic keratosis: Secondary | ICD-10-CM | POA: Diagnosis not present

## 2021-11-15 DIAGNOSIS — C44319 Basal cell carcinoma of skin of other parts of face: Secondary | ICD-10-CM | POA: Diagnosis not present

## 2021-11-15 NOTE — Progress Notes (Signed)
   Follow-Up Visit   Subjective  Brian Glass is a 71 y.o. male who presents for the following: Suture / Staple Removal (Patient here today for suture removal at left lower eyelid).  Patient scheduled 12/06/21 for Mohs.  The following portions of the chart were reviewed this encounter and updated as appropriate:   Tobacco  Allergies  Meds  Problems  Med Hx  Surg Hx  Fam Hx      Review of Systems:  No other skin or systemic complaints except as noted in HPI or Assessment and Plan.  Objective  Well appearing patient in no apparent distress; mood and affect are within normal limits.  A focused examination was performed including face. Relevant physical exam findings are noted in the Assessment and Plan.  left medial brow x 1 Erythematous thin papules/macules with gritty scale. Erythematous thin papules/macules with gritty scale.   right medial cheek Healing bx site    Assessment & Plan  AK (actinic keratosis) left medial brow x 1  Actinic keratoses are precancerous spots that appear secondary to cumulative UV radiation exposure/sun exposure over time. They are chronic with expected duration over 1 year. A portion of actinic keratoses will progress to squamous cell carcinoma of the skin. It is not possible to reliably predict which spots will progress to skin cancer and so treatment is recommended to prevent development of skin cancer.  Recommend daily broad spectrum sunscreen SPF 30+ to sun-exposed areas, reapply every 2 hours as needed.  Recommend staying in the shade or wearing long sleeves, sun glasses (UVA+UVB protection) and wide brim hats (4-inch brim around the entire circumference of the hat). Call for new or changing lesions.  Prior to procedure, discussed risks of blister formation, small wound, skin dyspigmentation, or rare scar following cryotherapy. Recommend Vaseline ointment to treated areas while healing.   Destruction of lesion - left medial brow x  1  Destruction method: cryotherapy   Informed consent: discussed and consent obtained   Lesion destroyed using liquid nitrogen: Yes   Cryotherapy cycles:  2 Outcome: patient tolerated procedure well with no complications   Post-procedure details: wound care instructions given    Basal cell carcinoma (BCC) of skin of other part of face right medial cheek  Patient scheduled for Mohs 12/06/21 at [facility]   Encounter for Removal of Sutures - Incision site at the left lower eyelid is clean, dry and intact - Wound cleansed, sutures removed, wound cleansed and steri strips applied.  - Discussed pathology results showing hidrocystoma - Patient advised to keep steri-strips dry until they fall off. - Scars remodel for a full year. - Once steri-strips fall off, patient can apply over-the-counter silicone scar cream each night to help with scar remodeling if desired. - Patient advised to call with any concerns or if they notice any new or changing lesions.  Return for TBSE, as scheduled.  Graciella Belton, RMA, am acting as scribe for Forest Gleason, MD .  Documentation: I have reviewed the above documentation for accuracy and completeness, and I agree with the above.  Forest Gleason, MD

## 2021-11-15 NOTE — Patient Instructions (Signed)
Cryotherapy Aftercare  . Wash gently with soap and water everyday.   . Apply Vaseline and Band-Aid daily until healed.  

## 2021-11-28 ENCOUNTER — Encounter: Payer: Self-pay | Admitting: Dermatology

## 2021-12-06 ENCOUNTER — Telehealth: Payer: Self-pay

## 2021-12-06 DIAGNOSIS — C44319 Basal cell carcinoma of skin of other parts of face: Secondary | ICD-10-CM | POA: Diagnosis not present

## 2021-12-06 NOTE — Telephone Encounter (Signed)
Progress notes from [facility] scanned in under the media tab for your review.

## 2021-12-07 NOTE — Telephone Encounter (Signed)
Reviewed.   CC MAs to mark complete in biopsy book. Thank you!

## 2021-12-19 ENCOUNTER — Telehealth: Payer: Self-pay

## 2021-12-19 NOTE — Telephone Encounter (Signed)
History and specimen tracking updated per MOHs progress note. 

## 2021-12-20 DIAGNOSIS — Z23 Encounter for immunization: Secondary | ICD-10-CM | POA: Diagnosis not present

## 2022-02-15 ENCOUNTER — Encounter: Payer: Self-pay | Admitting: Primary Care

## 2022-02-15 ENCOUNTER — Ambulatory Visit (INDEPENDENT_AMBULATORY_CARE_PROVIDER_SITE_OTHER): Payer: Medicare Other | Admitting: Primary Care

## 2022-02-15 ENCOUNTER — Ambulatory Visit (INDEPENDENT_AMBULATORY_CARE_PROVIDER_SITE_OTHER)
Admission: RE | Admit: 2022-02-15 | Discharge: 2022-02-15 | Disposition: A | Discharge status: Home / Self Care | Payer: Medicare Other | Source: Ambulatory Visit | Attending: Primary Care | Admitting: Primary Care

## 2022-02-15 VITALS — BP 116/72 | HR 68 | Temp 97.2°F | Ht 70.0 in | Wt 172.2 lb

## 2022-02-15 DIAGNOSIS — G8929 Other chronic pain: Secondary | ICD-10-CM | POA: Diagnosis not present

## 2022-02-15 DIAGNOSIS — M25552 Pain in left hip: Secondary | ICD-10-CM

## 2022-02-15 DIAGNOSIS — M25511 Pain in right shoulder: Secondary | ICD-10-CM

## 2022-02-15 DIAGNOSIS — M25551 Pain in right hip: Secondary | ICD-10-CM

## 2022-02-15 NOTE — Progress Notes (Addendum)
Subjective:    Patient ID: Brian Glass, male    DOB: May 04, 1950, 71 y.o.   MRN: [id number]  HPI  Brian Glass is a very pleasant 71 y.o. male with a history of prostate cancer, thrombocytopenia, paroxysmal atrial fibrillation, who presents today to discuss hip and back pain.   1) Hip Pain: Chronic for the last three months, mostly located to the left hip but over the last 1 month he's noticed pain to his right hip. He will occasionally notice radiation of pain down the lateral left thigh. He describes his pain as muscular, tightness, and cramping.   He denies loss of ROM to either hip, but he notices stiffness and muscle tightness when rising after sitting for more than 10 min at a times. He also notices symptoms with walking for longer than 1 mile. He leads an active lifestyle,  participates in spin class for one hour three days weekly, also walks 1-2 miles daily and has no pain or symptoms.   He denies injury/trauma, numbness. He's tried taking Naproxen once, he didn't notice improvement.   2) Shoulder Pain: Acute for the last 1-2 months, located to the right posterior shoulder. His pain will radiate down his right upper extremity to the right elbow and sometimes to the 5th digit. His pain is constant, worse pain with ROM such as reaching across his body to the left side, sleeping on the right shoulder.   He denies injury, weakness, numbness.   He does have chronic neck pain with certain movements. He will experience shocklike sensations in his neck at times.   Review of Systems  Musculoskeletal:  Positive for arthralgias and neck pain.  Skin:  Negative for color change.  Neurological:  Negative for weakness and numbness.         Past Medical History:  Diagnosis Date   Allergy    Arthritis    Atrial fibrillation ([place])    BCC (basal cell carcinoma) 11/08/2021   right medial cheek, Mohs 12/06/21   Bilateral vitreous detachment    Chickenpox    Hx of basal cell carcinoma  2018   L malar cheek   Prostate cancer ([place]) 2020   Typical atrial flutter ([facility])     Social History   Socioeconomic History   Marital status: Married    Spouse name: Dorian Pod   Number of children: Not on file   Years of education: Not on file   Highest education level: Not on file  Occupational History   Not on file  Tobacco Use   Smoking status: Never   Smokeless tobacco: Former    Types: Chew    Quit date: 1970  Vaping Use   Vaping Use: Never used  Substance and Sexual Activity   Alcohol use: Yes    Alcohol/week: 2.0 standard drinks of alcohol    Types: 2 Cans of beer per week    Comment: social   Drug use: No   Sexual activity: Yes    Birth control/protection: None  Other Topics Concern   Not on file  Social History Narrative   Married.   3 children. 2 grandchildren.   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   Social Determinants of Health   Financial Resource Strain: Low Risk  (05/22/2021)   Overall Financial Resource Strain (CARDIA)    Difficulty of Paying Living Expenses: Not hard at all  Food Insecurity: No Food Insecurity (05/22/2021)   Hunger Vital Sign    Worried  About Running Out of Food in the Last Year: Never true    Ran Out of Food in the Last Year: Never true  Transportation Needs: No Transportation Needs (05/22/2021)   PRAPARE - Hydrologist (Medical): No    Lack of Transportation (Non-Medical): No  Physical Activity: Sufficiently Active (05/22/2021)   Exercise Vital Sign    Days of Exercise per Week: 7 days    Minutes of Exercise per Session: 50 min  Stress: No Stress Concern Present (05/22/2021)   [place]    Feeling of Stress : Not at all  Social Connections: Moderately Integrated (05/22/2021)   Social Connection and Isolation Panel [NHANES]    Frequency of Communication with Friends and Family: More than three times a week    Frequency  of Social Gatherings with Friends and Family: More than three times a week    Attends Religious Services: Never    Marine scientist or Organizations: Yes    Attends Music therapist: More than 4 times per year    Marital Status: Married  Human resources officer Violence: Not At Risk (05/22/2021)   Humiliation, Afraid, Rape, and Kick questionnaire    Fear of Current or Ex-Partner: No    Emotionally Abused: No    Physically Abused: No    Sexually Abused: No    Past Surgical History:  Procedure Laterality Date   ATRIAL FIBRILLATION ABLATION N/A 05/10/2016   Procedure: Atrial Fibrillation Ablation;  Surgeon: [name], MD;  Location: [place] [facility];  Service: Cardiovascular;  Laterality: N/A;   CATARACT EXTRACTION, BILATERAL  2016   COLONOSCOPY WITH PROPOFOL N/A 10/20/2018   Procedure: COLONOSCOPY WITH PROPOFOL;  Surgeon: Lin Landsman, MD;  Location: [facility];  Service: Gastroenterology;  Laterality: N/A;   EYE SURGERY     MOHS SURGERY Left 2018   malar cheek, BCC   ROBOT ASSISTED LAPAROSCOPIC RADICAL PROSTATECTOMY N/A 04/06/2019   Procedure: XI ROBOTIC ASSISTED LAPAROSCOPIC RADICAL PROSTATECTOMY;  Surgeon: Billey Co, MD;  Location: [facility];  Service: Urology;  Laterality: N/A;   TONSILLECTOMY  1955    Family History  Problem Relation Age of Onset   Hyperlipidemia Mother    Heart disease Mother    Hypertension Mother    Hyperlipidemia Father    Heart disease Father    Stroke Father    Hypertension Father    Heart attack Father    Prostate cancer Brother    Atrial fibrillation Brother    Sleep apnea Brother    CAD Brother     No Known Allergies  Current Outpatient Medications on File Prior to Visit  Medication Sig Dispense Refill   Calcium Polycarbophil (EQ FIBER LAXATIVE PO) Take 1 capsule by mouth daily.      fluticasone (FLONASE) 50 MCG/ACT nasal spray Place 1 spray into both nostrils daily as needed for allergies.      Multiple  Vitamins-Minerals (SENIOR MULTIVITAMIN PLUS PO) Take 1 tablet by mouth daily.     olopatadine (PATANOL) 0.1 % ophthalmic solution Place 1 drop into both eyes 2 (two) times daily as needed for allergies.      Omega-3 Fatty Acids (FISH OIL PO) Take 1,300 mg by mouth daily.     rosuvastatin (CRESTOR) 5 MG tablet TAKE 1 TABLET EVERY EVENING FOR CHOLESTEROL 90 tablet 3   No current facility-administered medications on file prior to visit.    BP 116/72 (BP Location:  Left Arm, Patient Position: Sitting, Cuff Size: Normal)   Pulse 68   Temp (!) 97.2 F (36.2 C) (Temporal)   Ht '5\' 10"'$  (1.778 m)   Wt 172 lb 4 oz (78.1 kg)   SpO2 96%   BMI 24.72 kg/m  Objective:   Physical Exam Constitutional:      General: He is not in acute distress. Cardiovascular:     Rate and Rhythm: Normal rate.  Pulmonary:     Effort: Pulmonary effort is normal.  Musculoskeletal:     Right shoulder: Decreased range of motion.     Left shoulder: Normal range of motion.     Right hip: Normal range of motion. Normal strength.     Left hip: Normal range of motion. Normal strength.     Comments: Overall normal ROM, but he does have pain with lateral abduction of right shoulder.   Skin:    General: Skin is warm and dry.           Assessment & Plan:   Problem List Items Addressed This Visit       Other   Chronic hip pain - Primary    Differentials include arthritis vs statin side effects.  Checking plain films of the hips today.  Hold rosuvastatin x 2 weeks, he will update. Consider PT if no improvement.      Relevant Orders   DG HIPS BILAT WITH PELVIS 2V   Acute pain of right shoulder    Question osteoarthritis. No obvious rotator cuff injury.  There does appear to be some nerve involvement.  Checking plain films of the shoulder today. Holding rosuvastatin for 2 weeks.  Consider PT if no improvement.       Relevant Orders   DG Shoulder Right       Pleas Koch, NP

## 2022-02-15 NOTE — Assessment & Plan Note (Signed)
Differentials include arthritis vs statin side effects.  Checking plain films of the hips today.  Hold rosuvastatin x 2 weeks, he will update. Consider PT if no improvement.

## 2022-02-15 NOTE — Patient Instructions (Signed)
Hold your rosuvastatin cholesterol pill for 2 weeks.  Complete xray(s) prior to leaving today. I will notify you of your results once received.  It was a pleasure to see you today!

## 2022-02-15 NOTE — Assessment & Plan Note (Signed)
Question osteoarthritis. No obvious rotator cuff injury.  There does appear to be some nerve involvement.  Checking plain films of the shoulder today. Holding rosuvastatin for 2 weeks.  Consider PT if no improvement.

## 2022-02-20 DIAGNOSIS — M25511 Pain in right shoulder: Secondary | ICD-10-CM

## 2022-02-20 DIAGNOSIS — G8929 Other chronic pain: Secondary | ICD-10-CM

## 2022-02-23 ENCOUNTER — Other Ambulatory Visit: Payer: Self-pay | Admitting: Primary Care

## 2022-02-23 DIAGNOSIS — E785 Hyperlipidemia, unspecified: Secondary | ICD-10-CM

## 2022-02-23 NOTE — Telephone Encounter (Signed)
Spoke to pt, scheduled f/u for 03/20/22

## 2022-02-23 NOTE — Telephone Encounter (Signed)
Patient is due for follow up with labs in January 2024. Please schedule.

## 2022-03-20 ENCOUNTER — Ambulatory Visit (INDEPENDENT_AMBULATORY_CARE_PROVIDER_SITE_OTHER): Payer: Medicare Other | Admitting: Primary Care

## 2022-03-20 ENCOUNTER — Encounter: Payer: Self-pay | Admitting: Primary Care

## 2022-03-20 VITALS — BP 118/60 | HR 65 | Temp 97.3°F | Ht 70.0 in | Wt 171.0 lb

## 2022-03-20 DIAGNOSIS — M25552 Pain in left hip: Secondary | ICD-10-CM

## 2022-03-20 DIAGNOSIS — M25551 Pain in right hip: Secondary | ICD-10-CM

## 2022-03-20 DIAGNOSIS — G8929 Other chronic pain: Secondary | ICD-10-CM | POA: Diagnosis not present

## 2022-03-20 NOTE — Assessment & Plan Note (Signed)
No change in exam today. Not statin induced.   Proceed with physical therapy as scheduled.  Continue Aleve PRN.   Offered better treatment such as Meloxicam 15 mg daily or gabapentin 100-300 mg HS, he kindly declines but will update if he changes his mind.

## 2022-03-20 NOTE — Patient Instructions (Signed)
Proceed with physical therapy as scheduled.  It was a pleasure to see you today!

## 2022-03-20 NOTE — Progress Notes (Signed)
Subjective:    Patient ID: Brian Glass, male    DOB: 1950/08/21, 72 y.o.   MRN: [id number]  HPI  Brian Glass is a very pleasant 71 y.o. male with a history of paroxysmal atrial fibrillation, hyperlipidemia, prostate cancer, chronic hip pain who presents today to discuss chronic hip pain.  He was last evaluated on 02/15/22 for chronic bilateral hip pain, left > right. He underwent x-rays of the bilateral hips which was negative for significant arthritis and acute fracture.  We decided to hold his rosuvastatin x 2 weeks to see if this was contributing.  Later, he reported back that his pain persisted despite holding rosuvastatin so his rosuvastatin was resumed.  He was referred to physical therapy on 02/21/2022 for ongoing symptom, his first appointment is scheduled for later this week.   Two evenings ago he began to notice his hip pain at night. He's also noticed an altered gait in an attempt to reduce his pain when walking. He continues to participate in spinning classes and walking on uneven surfaces per usual without difficulty.   He's taken Naproxen a few times which may help some. He does notice radiation of pain down the left lateral side of his left lower extremity proximal to knee. He denies numbness or weakness.    Review of Systems  Musculoskeletal:  Positive for arthralgias.  Neurological:  Negative for weakness and numbness.         Past Medical History:  Diagnosis Date   Allergy    Arthritis    Atrial fibrillation ([place])    BCC (basal cell carcinoma) 11/08/2021   right medial cheek, Mohs 12/06/21   Bilateral vitreous detachment    Chickenpox    Hx of basal cell carcinoma 2018   L malar cheek   Prostate cancer ([place]) 2020   Typical atrial flutter ([facility])     Social History   Socioeconomic History   Marital status: Married    Spouse name: Dorian Pod   Number of children: Not on file   Years of education: Not on file   Highest education level: Not on file   Occupational History   Not on file  Tobacco Use   Smoking status: Never   Smokeless tobacco: Former    Types: Chew    Quit date: 1970  Vaping Use   Vaping Use: Never used  Substance and Sexual Activity   Alcohol use: Yes    Alcohol/week: 2.0 standard drinks of alcohol    Types: 2 Cans of beer per week    Comment: social   Drug use: No   Sexual activity: Yes    Birth control/protection: None  Other Topics Concern   Not on file  Social History Narrative   Married.   3 children. 2 grandchildren.   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   Social Determinants of Health   Financial Resource Strain: Low Risk  (05/22/2021)   Overall Financial Resource Strain (CARDIA)    Difficulty of Paying Living Expenses: Not hard at all  Food Insecurity: No Food Insecurity (05/22/2021)   Hunger Vital Sign    Worried About Running Out of Food in the Last Year: Never true    Ran Out of Food in the Last Year: Never true  Transportation Needs: No Transportation Needs (05/22/2021)   PRAPARE - Hydrologist (Medical): No    Lack of Transportation (Non-Medical): No  Physical Activity: Sufficiently Active (05/22/2021)   Exercise  Vital Sign    Days of Exercise per Week: 7 days    Minutes of Exercise per Session: 50 min  Stress: No Stress Concern Present (05/22/2021)   [place]    Feeling of Stress : Not at all  Social Connections: Moderately Integrated (05/22/2021)   Social Connection and Isolation Panel [NHANES]    Frequency of Communication with Friends and Family: More than three times a week    Frequency of Social Gatherings with Friends and Family: More than three times a week    Attends Religious Services: Never    Marine scientist or Organizations: Yes    Attends Music therapist: More than 4 times per year    Marital Status: Married  Human resources officer Violence:  Not At Risk (05/22/2021)   Humiliation, Afraid, Rape, and Kick questionnaire    Fear of Current or Ex-Partner: No    Emotionally Abused: No    Physically Abused: No    Sexually Abused: No    Past Surgical History:  Procedure Laterality Date   ATRIAL FIBRILLATION ABLATION N/A 05/10/2016   Procedure: Atrial Fibrillation Ablation;  Surgeon: [name], MD;  Location: [place] [facility];  Service: Cardiovascular;  Laterality: N/A;   CATARACT EXTRACTION, BILATERAL  2016   COLONOSCOPY WITH PROPOFOL N/A 10/20/2018   Procedure: COLONOSCOPY WITH PROPOFOL;  Surgeon: Lin Landsman, MD;  Location: [facility] ENDOSCOPY;  Service: Gastroenterology;  Laterality: N/A;   EYE SURGERY     MOHS SURGERY Left 2018   malar cheek, BCC   ROBOT ASSISTED LAPAROSCOPIC RADICAL PROSTATECTOMY N/A 04/06/2019   Procedure: XI ROBOTIC ASSISTED LAPAROSCOPIC RADICAL PROSTATECTOMY;  Surgeon: Billey Co, MD;  Location: [facility];  Service: Urology;  Laterality: N/A;   TONSILLECTOMY  1955    Family History  Problem Relation Age of Onset   Hyperlipidemia Mother    Heart disease Mother    Hypertension Mother    Hyperlipidemia Father    Heart disease Father    Stroke Father    Hypertension Father    Heart attack Father    Prostate cancer Brother    Atrial fibrillation Brother    Sleep apnea Brother    CAD Brother     No Known Allergies  Current Outpatient Medications on File Prior to Visit  Medication Sig Dispense Refill   Calcium Polycarbophil (EQ FIBER LAXATIVE PO) Take 1 capsule by mouth daily.      fluticasone (FLONASE) 50 MCG/ACT nasal spray Place 1 spray into both nostrils daily as needed for allergies.      Multiple Vitamins-Minerals (SENIOR MULTIVITAMIN PLUS PO) Take 1 tablet by mouth daily.     Omega-3 Fatty Acids (FISH OIL PO) Take 1,300 mg by mouth daily.     rosuvastatin (CRESTOR) 5 MG tablet TAKE 1 TABLET EVERY EVENING FOR CHOLESTEROL 90 tablet 0   olopatadine (PATANOL) 0.1 % ophthalmic solution  Place 1 drop into both eyes 2 (two) times daily as needed for allergies.  (Patient not taking: Reported on 03/20/2022)     No current facility-administered medications on file prior to visit.    BP 118/60   Pulse 65   Temp (!) 97.3 F (36.3 C) (Temporal)   Ht '5\' 10"'$  (1.778 m)   Wt 171 lb (77.6 kg)   SpO2 99%   BMI 24.54 kg/m  Objective:   Physical Exam Constitutional:      General: He is not in acute distress. Pulmonary:  Effort: Pulmonary effort is normal.  Musculoskeletal:     Right hip: No deformity. Normal range of motion. Normal strength.     Left hip: No deformity. Normal range of motion. Normal strength.     Comments: Ambulates well in clinic without assistance .           Assessment & Plan:   Problem List Items Addressed This Visit       Other   Chronic hip pain - Primary    No change in exam today. Not statin induced.   Proceed with physical therapy as scheduled.  Continue Aleve PRN.   Offered better treatment such as Meloxicam 15 mg daily or gabapentin 100-300 mg HS, he kindly declines but will update if he changes his mind.          Pleas Koch, NP

## 2022-03-22 ENCOUNTER — Ambulatory Visit: Payer: Medicare Other | Attending: Primary Care | Admitting: Physical Therapy

## 2022-03-22 DIAGNOSIS — M25551 Pain in right hip: Secondary | ICD-10-CM

## 2022-03-22 DIAGNOSIS — G8929 Other chronic pain: Secondary | ICD-10-CM | POA: Diagnosis not present

## 2022-03-22 DIAGNOSIS — M25552 Pain in left hip: Secondary | ICD-10-CM | POA: Diagnosis not present

## 2022-03-22 DIAGNOSIS — M25511 Pain in right shoulder: Secondary | ICD-10-CM | POA: Insufficient documentation

## 2022-03-22 NOTE — Therapy (Signed)
OUTPATIENT PHYSICAL THERAPY LOWER EXTREMITY EVALUATION   Patient Name: Brian Glass MRN: [id number] DOB:1950-04-19, 72 y.o., male Today's Date: 03/26/2022  END OF SESSION:    Past Medical History:  Diagnosis Date   Allergy    Arthritis    Atrial fibrillation ([place])    BCC (basal cell carcinoma) 11/08/2021   right medial cheek, Mohs 12/06/21   Bilateral vitreous detachment    Chickenpox    Hx of basal cell carcinoma 2018   L malar cheek   Prostate cancer ([place]) 2020   Typical atrial flutter ([place])    Past Surgical History:  Procedure Laterality Date   ATRIAL FIBRILLATION ABLATION N/A 05/10/2016   Procedure: Atrial Fibrillation Ablation;  Surgeon: [name], MD;  Location: [facility];  Service: Cardiovascular;  Laterality: N/A;   CATARACT EXTRACTION, BILATERAL  2016   COLONOSCOPY WITH PROPOFOL N/A 10/20/2018   Procedure: COLONOSCOPY WITH PROPOFOL;  Surgeon: Lin Landsman, MD;  Location: [facility] ENDOSCOPY;  Service: Gastroenterology;  Laterality: N/A;   EYE SURGERY     MOHS SURGERY Left 2018   malar cheek, BCC   ROBOT ASSISTED LAPAROSCOPIC RADICAL PROSTATECTOMY N/A 04/06/2019   Procedure: XI ROBOTIC ASSISTED LAPAROSCOPIC RADICAL PROSTATECTOMY;  Surgeon: Billey Co, MD;  Location: [facility]S;  Service: Urology;  Laterality: N/A;   TONSILLECTOMY  1955   Patient Active Problem List   Diagnosis Date Noted   Chronic hip pain 02/15/2022   Acute pain of right shoulder 02/15/2022   Thrombocytopenia ([place]) 11/10/2019   History of prostate cancer 04/06/2019   Encounter for screening colonoscopy 10/01/2018   Wart 11/28/2017   Medicare annual wellness visit, subsequent 03/23/2016   Paroxysmal atrial fibrillation ([place]) 11/29/2015   History of prostatectomy 11/29/2015   Hyperlipidemia 03/24/2015    PCP: Alma Friendly NP  REFERRING PROVIDER: Alma Friendly NP  REFERRING DIAG: bilat hip pain  THERAPY DIAG:  Pain in left hip - Plan: PT plan of care  cert/re-cert  Pain in right hip - Plan: PT plan of care cert/re-cert  Rationale for Evaluation and Treatment: Rehabilitation  ONSET DATE: Oct 2023  SUBJECTIVE:   SUBJECTIVE STATEMENT: Bilat hip pain   PERTINENT HISTORY: Pt is a 72 year old male presenting with bilat hip pain beginning with insidious onset. Pt reports anteriolateral pain bilaterally. Feels achy in nature, "more of a muscle than joint or nerve pain". Denies n/t or radicular symptoms. Noticed recently that he is tilting forward when he walks because "I think this makes my pain a little less". Pain is aggravated by walking >1hour, sitting more than 41mns and laying on sides. Current pain 0/10; worst 6/10. Pt is retired, enjoys being active, bird watching and photography. Pt reports pain is elicited by walking >1 hour, laying on his hip. Pt is [facility] [employer] and this does not elicit pain. Walks daily with his dog 1 mile. NO falls in past 6 months. Pt denies N/V, B&B changes, unexplained weight fluctuation, saddle paresthesia, fever, night sweats, or unrelenting night pain at this time.   PAIN:  Are you having pain? Yes: NPRS scale: 6/10 Pain location: bilat anteriolateral hips Pain description: achy, nagging Aggravating factors: walking >1hour, sitting more than 382ms and laying on sides Relieving factors: leaning forward, rest  PRECAUTIONS: None  WEIGHT BEARING RESTRICTIONS: No  FALLS:  Has patient fallen in last 6 months? No  LIVING ENVIRONMENT: Lives with: lives with their spouse Lives in: House/apartment Stairs: Yes: External: 1 steps; none Has  following equipment at home: None  OCCUPATION: retired  PLOF: Independent  PATIENT GOALS: decrease pain  NEXT MD VISIT:   OBJECTIVE:   DIAGNOSTIC FINDINGS: Xray bilat hips negative  PATIENT SURVEYS:  FOTO 60 goal of 55  COGNITION: Overall cognitive status: Within functional limits for tasks  assessed     SENSATION: WFL   MUSCLE LENGTH: Hamstrings: 25% limited bilat  POSTURE: NEXT VISIT DOCUMENT  PALPATION: TTP with concordant pain to deep palpation over piriformis  LOWER EXTREMITY ROM:  Active ROM Right eval Left eval  Hip flexion WNL WNL  Hip extension WNL WNL  Hip abduction WNL WNL  Hip adduction    Hip internal rotation 25% limited 25% limited  Hip external rotation WNL WNL  Knee flexion WNL WNL  Knee extension WNL WNL  Ankle dorsiflexion    Ankle plantarflexion    Ankle inversion    Ankle eversion     (Blank rows = not tested)  LOWER EXTREMITY MMT:  MMT Right eval Left eval  Hip flexion 4- 4-  Hip extension 4- 4-  Hip abduction 4 4  Hip adduction    Hip internal rotation 5 5  Hip external rotation 5 5  Knee flexion 5 5  Knee extension 5 5  Ankle dorsiflexion    Ankle plantarflexion    Ankle inversion    Ankle eversion     (Blank rows = not tested)  Plank test 37sec 1RM leg press 65# bilat  LOWER EXTREMITY SPECIAL TESTS:  Hip special tests: Saralyn Pilar (FABER) test: negative, Thomas test: negative, Ober's test: negative, Ely's test: negative, and Hip scouring test: negative  FUNCTIONAL TESTS:  5x STS 11sec 30sec STS test: 14 stands 10MWT self selected: 1.34ms fastest: 1.338m 6MWT 139022fith 6/10 NPRS  GAIT: Distance walked: [id number] device utilized: None Level of assistance: Complete Independence Comments: antalgic compensated trendelenberg   TODAY'S TREATMENT:                                                                                                                              DATE: 03/22/22  PT reviewed the following HEP with patient with patient able to demonstrate a set of the following with min cuing for correction needed. PT educated patient on parameters of therex (how/when to inc/decrease intensity, frequency, rep/set range, stretch hold time, and purpose of therex) with verbalized understanding.   - Supine  Figure 4 Piriformis Stretch  - 2-3 x daily - 7 x weekly - 30-60sec hold - Standing Hip Hiking  - 1 x daily - 7 x weekly - 12-20 reps  PATIENT EDUCATION:  Education details: Patient was educated on diagnosis, anatomy and pathology involved, prognosis, role of PT, and was given an HEP, demonstrating exercise with proper form following verbal and tactile cues, and was given a paper hand out to continue exercise at home. Pt was educated on and agreed to plan of care. Person educated: Patient Education method: ExplEducation officer, environmentalrbal  cues, and Handouts Education comprehension: verbalized understanding, returned demonstration, and verbal cues required  HOME EXERCISE PROGRAM:  [id number]  ASSESSMENT:  CLINICAL IMPRESSION: Patient is a 72 y.o. male who was seen today for physical therapy evaluation and treatment for bilat hip pain.Impairments in hip and core strength , gait abnormalities, and pain. Activity limitations in prolonged standing, walking, laying on affected hip, and pain; inhibiting sleep hygiene, and household and community ADLs. Would benefit from skilled PT to address above deficits and promote optimal return to PLOF.   OBJECTIVE IMPAIRMENTS: Abnormal gait, decreased activity tolerance, decreased endurance, decreased mobility, difficulty walking, decreased ROM, decreased strength, increased fascial restrictions, impaired flexibility, improper body mechanics, and postural dysfunction.   ACTIVITY LIMITATIONS: carrying, lifting, bending, sitting, standing, squatting, and transfers  PARTICIPATION LIMITATIONS: meal prep, driving, community activity, occupation, and church  PERSONAL FACTORS: Age, Past/current experiences, Time since onset of injury/illness/exacerbation, and 1 comorbidity: Atrial fib  are also affecting patient's functional outcome.   REHAB POTENTIAL: Good  CLINICAL DECISION MAKING: Evolving/moderate complexity  EVALUATION COMPLEXITY:  Moderate   GOALS: Goals reviewed with patient? Yes  SHORT TERM GOALS: Target date: 04/18/22 Pt will be independent with HEP in order to improve strength and balance in order to decrease fall risk and improve function at home and work.  Baseline: HEP given  Goal status: INITIAL   LONG TERM GOALS: Target date: 05/25/22  Patient will increase FOTO score to 79 to demonstrate predicted increase in functional mobility to complete ADLs Baseline:  Goal status: INITIAL  2.  Pt will increase 6MWT by at least 21m(1633f in order to demonstrate clinically significant improvement in muscular endurance for  community ambulation  Baseline: [id number] status: INITIAL  3.  Pt will decrease worst pain as reported on NPRS by at least 3 points in order to demonstrate clinically significant reduction in pain. Baseline: 6/10 Goal status: INITIAL  4.  Pt will demonstrate gross hip MMT of 4+ in order to be able to complete all household ADLs Baseline: see eval Goal status: INITIAL     PLAN:  PT FREQUENCY: 1-2x/week  PT DURATION: 8 weeks  PLANNED INTERVENTIONS: Therapeutic exercises, Therapeutic activity, Neuromuscular re-education, Balance training, Gait training, Patient/Family education, Self Care, Joint mobilization, Joint manipulation, Stair training, Aquatic Therapy, Dry Needling, Electrical stimulation, Spinal manipulation, Spinal mobilization, Cryotherapy, Moist heat, Traction, Ultrasound, Manual therapy, and Re-evaluation  PLAN FOR NEXT SESSION:   CheDurwin RegesT CheDurwin RegesT 03/26/2022, 3:13 PM

## 2022-03-27 ENCOUNTER — Ambulatory Visit: Payer: Medicare Other | Admitting: Physical Therapy

## 2022-03-27 DIAGNOSIS — Z0184 Encounter for antibody response examination: Secondary | ICD-10-CM

## 2022-03-27 DIAGNOSIS — Z789 Other specified health status: Secondary | ICD-10-CM

## 2022-03-29 ENCOUNTER — Encounter: Payer: Self-pay | Admitting: Physical Therapy

## 2022-03-29 ENCOUNTER — Ambulatory Visit: Payer: Medicare Other | Admitting: Physical Therapy

## 2022-03-29 DIAGNOSIS — M25551 Pain in right hip: Secondary | ICD-10-CM

## 2022-03-29 DIAGNOSIS — M25552 Pain in left hip: Secondary | ICD-10-CM | POA: Diagnosis not present

## 2022-03-29 DIAGNOSIS — G8929 Other chronic pain: Secondary | ICD-10-CM | POA: Diagnosis not present

## 2022-03-29 DIAGNOSIS — M25511 Pain in right shoulder: Secondary | ICD-10-CM | POA: Diagnosis not present

## 2022-03-29 NOTE — Therapy (Signed)
OUTPATIENT PHYSICAL THERAPY LOWER EXTREMITY TREATMENT   Patient Name: Brian Glass MRN: [id number] DOB:1950-06-13, 72 y.o., male Today's Date: 03/29/2022  END OF SESSION:  PT End of Session - 03/29/22 1414     Visit Number 2    Number of Visits 17    Date for PT Re-Evaluation 05/25/22    Authorization - Visit Number 2    Authorization - Number of Visits 17    Progress Note Due on Visit 10    PT Start Time 7062    PT Stop Time 1427    PT Time Calculation (min) 38 min    Activity Tolerance Patient tolerated treatment well    Behavior During Therapy WFL for tasks assessed/performed              Past Medical History:  Diagnosis Date   Allergy    Arthritis    Atrial fibrillation ([place])    BCC (basal cell carcinoma) 11/08/2021   right medial cheek, Mohs 12/06/21   Bilateral vitreous detachment    Chickenpox    Hx of basal cell carcinoma 2018   L malar cheek   Prostate cancer ([place]) 2020   Typical atrial flutter ([place])    Past Surgical History:  Procedure Laterality Date   ATRIAL FIBRILLATION ABLATION N/A 05/10/2016   Procedure: Atrial Fibrillation Ablation;  Surgeon: [name], MD;  Location: [facility];  Service: Cardiovascular;  Laterality: N/A;   CATARACT EXTRACTION, BILATERAL  2016   COLONOSCOPY WITH PROPOFOL N/A 10/20/2018   Procedure: COLONOSCOPY WITH PROPOFOL;  Surgeon: Lin Landsman, MD;  Location: [facility] ENDOSCOPY;  Service: Gastroenterology;  Laterality: N/A;   EYE SURGERY     MOHS SURGERY Left 2018   malar cheek, BCC   ROBOT ASSISTED LAPAROSCOPIC RADICAL PROSTATECTOMY N/A 04/06/2019   Procedure: XI ROBOTIC ASSISTED LAPAROSCOPIC RADICAL PROSTATECTOMY;  Surgeon: Billey Co, MD;  Location: [facility]S;  Service: Urology;  Laterality: N/A;   TONSILLECTOMY  1955   Patient Active Problem List   Diagnosis Date Noted   Chronic hip pain 02/15/2022   Acute pain of right shoulder 02/15/2022   Thrombocytopenia ([place]) 11/10/2019   History of prostate  cancer 04/06/2019   Encounter for screening colonoscopy 10/01/2018   Wart 11/28/2017   Medicare annual wellness visit, subsequent 03/23/2016   Paroxysmal atrial fibrillation ([place]) 11/29/2015   History of prostatectomy 11/29/2015   Hyperlipidemia 03/24/2015    PCP: Alma Friendly NP  REFERRING PROVIDER: Alma Friendly NP  REFERRING DIAG: bilat hip pain  THERAPY DIAG:  Pain in left hip  Pain in right hip  Rationale for Evaluation and Treatment: Rehabilitation  ONSET DATE: Oct 2023  SUBJECTIVE:   SUBJECTIVE STATEMENT: Pt reports bird watching this am and after walking 2 miles the rear side of his L hip and the front of his R hip were "really hurting". Lasted about an hour.   PERTINENT HISTORY: Pt is a 72 year old male presenting with bilat hip pain beginning with insidious onset. Pt reports anteriolateral pain bilaterally. Feels achy in nature, "more of a muscle than joint or nerve pain". Denies n/t or radicular symptoms. Noticed recently that he is tilting forward when he walks because "I think this makes my pain a little less". Pain is aggravated by walking >1hour, sitting more than 20mns and laying on sides. Current pain 0/10; worst 6/10. Pt is retired, enjoys being active, bird watching and photography. Pt reports pain is elicited by walking >1 hour, laying on his hip. Pt is  active [facility] [employer], and this does not elicit pain. Walks daily with his dog 1 mile. NO falls in past 6 months. Pt denies N/V, B&B changes, unexplained weight fluctuation, saddle paresthesia, fever, night sweats, or unrelenting night pain at this time.   PAIN:  Are you having pain? Yes: NPRS scale: 6/10 Pain location: bilat anteriolateral hips Pain description: achy, nagging Aggravating factors: walking >1hour, sitting more than 11mns and laying on sides Relieving factors: leaning forward, rest  PRECAUTIONS: None  WEIGHT BEARING RESTRICTIONS: No  FALLS:  Has patient fallen  in last 6 months? No  LIVING ENVIRONMENT: Lives with: lives with their spouse Lives in: House/apartment Stairs: Yes: External: 1 steps; none Has following equipment at home: None  OCCUPATION: retired  PLOF: Independent  PATIENT GOALS: decrease pain  NEXT MD VISIT:   OBJECTIVE:   DIAGNOSTIC FINDINGS: Xray bilat hips negative  PATIENT SURVEYS:  FOTO 60 goal of 738 COGNITION: Overall cognitive status: Within functional limits for tasks assessed     SENSATION: WFL   MUSCLE LENGTH: Hamstrings: 25% limited bilat  POSTURE: thoracic kyphosis, FHRS, decreased lumbar lordosis  PALPATION: TTP with concordant pain to deep palpation over piriformis  LOWER EXTREMITY ROM:  Active ROM Right eval Left eval  Hip flexion WNL WNL  Hip extension WNL WNL  Hip abduction WNL WNL  Hip adduction    Hip internal rotation 25% limited 25% limited  Hip external rotation WNL WNL  Knee flexion WNL WNL  Knee extension WNL WNL  Ankle dorsiflexion    Ankle plantarflexion    Ankle inversion    Ankle eversion     (Blank rows = not tested)  LOWER EXTREMITY MMT:  MMT Right eval Left eval  Hip flexion 4- 4-  Hip extension 4- 4-  Hip abduction 4 4  Hip adduction    Hip internal rotation 5 5  Hip external rotation 5 5  Knee flexion 5 5  Knee extension 5 5  Ankle dorsiflexion    Ankle plantarflexion    Ankle inversion    Ankle eversion     (Blank rows = not tested)  Plank test 37sec 1RM leg press 65# bilat  LOWER EXTREMITY SPECIAL TESTS:  Hip special tests: PSaralyn Pilar(FABER) test: negative, Thomas test: negative, Ober's test: negative, Ely's test: negative, and Hip scouring test: negative  FUNCTIONAL TESTS:  5x STS 11sec 30sec STS test: 14 stands 10MWT self selected: 1.140m fastest: 1.3275m6MWT [id number] 6/10 NPRS  GAIT: Distance walked: [id number] device utilized: None Level of assistance: Complete Independence Comments: antalgic compensated  trendelenberg   TODAY'S TREATMENT:                                                                                                                              DATE: 03/22/22   Nustep seat 10 UE 14 L3 SPM 80s for gentle mobility and hip strengthening  Straight leg deadlift 10# DB  bilat x12; 110KB x12 with good carry over of couple initial  Single leg deadlift 2x 6 with cone touch for technique with patient reporting more muscle fatigue   Alt side lunge 2x 12 with cuing for slow step to start position for static balance moment  SLS L: 59sec R: ceased 120sec  Standing hip hike L 18 reps to failure; education on completing 12-20reps > 8-12   PATIENT EDUCATION:  Education details: Patient was educated on diagnosis, anatomy and pathology involved, prognosis, role of PT, and was given an HEP, demonstrating exercise with proper form following verbal and tactile cues, and was given a paper hand out to continue exercise at home. Pt was educated on and agreed to plan of care. Person educated: Patient Education method: Explanation, Demonstration, Verbal cues, and Handouts Education comprehension: verbalized understanding, returned demonstration, and verbal cues required  HOME EXERCISE PROGRAM:  [id number]  ASSESSMENT:  CLINICAL IMPRESSION: PT initiated therex progression for increased L hip endurance/stability. Patient with clear decrease in L hip endurance > R with 50% of SLS time before fatigue to failure. R hip pain most likely occurring from compensation of lack of LLE endurance. Pt is able to comply with all cuing for proper technique of therex with good effort throughout session. No increased pain, only expected muscle fatigue. PT will continue progression as able.    OBJECTIVE IMPAIRMENTS: Abnormal gait, decreased activity tolerance, decreased endurance, decreased mobility, difficulty walking, decreased ROM, decreased strength, increased fascial restrictions, impaired flexibility, improper  body mechanics, and postural dysfunction.   ACTIVITY LIMITATIONS: carrying, lifting, bending, sitting, standing, squatting, and transfers  PARTICIPATION LIMITATIONS: meal prep, driving, community activity, occupation, and church  PERSONAL FACTORS: Age, Past/current experiences, Time since onset of injury/illness/exacerbation, and 1 comorbidity: Atrial fib  are also affecting patient's functional outcome.   REHAB POTENTIAL: Good  CLINICAL DECISION MAKING: Evolving/moderate complexity  EVALUATION COMPLEXITY: Moderate   GOALS: Goals reviewed with patient? Yes  SHORT TERM GOALS: Target date: 04/18/22 Pt will be independent with HEP in order to improve strength and balance in order to decrease fall risk and improve function at home and work.  Baseline: HEP given  Goal status: INITIAL   LONG TERM GOALS: Target date: 05/25/22  Patient will increase FOTO score to 79 to demonstrate predicted increase in functional mobility to complete ADLs Baseline:  Goal status: INITIAL  2.  Pt will increase 6MWT by at least 78m(1669f in order to demonstrate clinically significant improvement in muscular endurance for  community ambulation  Baseline: [id number] status: INITIAL  3.  Pt will decrease worst pain as reported on NPRS by at least 3 points in order to demonstrate clinically significant reduction in pain. Baseline: 6/10 Goal status: INITIAL  4.  Pt will demonstrate gross hip MMT of 4+ in order to be able to complete all household ADLs Baseline: see eval Goal status: INITIAL     PLAN:  PT FREQUENCY: 1-2x/week  PT DURATION: 8 weeks  PLANNED INTERVENTIONS: Therapeutic exercises, Therapeutic activity, Neuromuscular re-education, Balance training, Gait training, Patient/Family education, Self Care, Joint mobilization, Joint manipulation, Stair training, Aquatic Therapy, Dry Needling, Electrical stimulation, Spinal manipulation, Spinal mobilization, Cryotherapy, Moist heat, Traction,  Ultrasound, Manual therapy, and Re-evaluation  PLAN FOR NEXT SESSION:   CheDurwin RegesT CheDurwin RegesT 03/29/2022, 2:52 PM

## 2022-04-02 ENCOUNTER — Encounter: Payer: Self-pay | Admitting: Physical Therapy

## 2022-04-02 ENCOUNTER — Ambulatory Visit: Payer: Medicare Other | Admitting: Physical Therapy

## 2022-04-02 DIAGNOSIS — M25552 Pain in left hip: Secondary | ICD-10-CM

## 2022-04-02 DIAGNOSIS — M25551 Pain in right hip: Secondary | ICD-10-CM

## 2022-04-02 DIAGNOSIS — G8929 Other chronic pain: Secondary | ICD-10-CM | POA: Diagnosis not present

## 2022-04-02 DIAGNOSIS — M25511 Pain in right shoulder: Secondary | ICD-10-CM | POA: Diagnosis not present

## 2022-04-02 NOTE — Therapy (Signed)
OUTPATIENT PHYSICAL THERAPY LOWER EXTREMITY TREATMENT   Patient Name: Brian Glass MRN: [id number] DOB:1950-04-24, 73 y.o., male Today's Date: 04/02/2022  END OF SESSION:  PT End of Session - 04/02/22 1518     Visit Number 3    Number of Visits 17    Date for PT Re-Evaluation 05/25/22    Authorization - Visit Number 3    Authorization - Number of Visits 17    Progress Note Due on Visit 10    PT Start Time 3810    PT Stop Time 1555    PT Time Calculation (min) 40 min    Activity Tolerance Patient tolerated treatment well    Behavior During Therapy [facility] for tasks assessed/performed               Past Medical History:  Diagnosis Date   Allergy    Arthritis    Atrial fibrillation ([place])    BCC (basal cell carcinoma) 11/08/2021   right medial cheek, Mohs 12/06/21   Bilateral vitreous detachment    Chickenpox    Hx of basal cell carcinoma 2018   L malar cheek   Prostate cancer ([place]) 2020   Typical atrial flutter ([place])    Past Surgical History:  Procedure Laterality Date   ATRIAL FIBRILLATION ABLATION N/A 05/10/2016   Procedure: Atrial Fibrillation Ablation;  Surgeon: [name], MD;  Location: [facility];  Service: Cardiovascular;  Laterality: N/A;   CATARACT EXTRACTION, BILATERAL  2016   COLONOSCOPY WITH PROPOFOL N/A 10/20/2018   Procedure: COLONOSCOPY WITH PROPOFOL;  Surgeon: Lin Landsman, MD;  Location: [facility] ENDOSCOPY;  Service: Gastroenterology;  Laterality: N/A;   EYE SURGERY     MOHS SURGERY Left 2018   malar cheek, BCC   ROBOT ASSISTED LAPAROSCOPIC RADICAL PROSTATECTOMY N/A 04/06/2019   Procedure: XI ROBOTIC ASSISTED LAPAROSCOPIC RADICAL PROSTATECTOMY;  Surgeon: Billey Co, MD;  Location: [facility] ORS;  Service: Urology;  Laterality: N/A;   TONSILLECTOMY  1955   Patient Active Problem List   Diagnosis Date Noted   Chronic hip pain 02/15/2022   Acute pain of right shoulder 02/15/2022   Thrombocytopenia ([place]) 11/10/2019   History of prostate  cancer 04/06/2019   Encounter for screening colonoscopy 10/01/2018   Wart 11/28/2017   Medicare annual wellness visit, subsequent 03/23/2016   Paroxysmal atrial fibrillation ([place]) 11/29/2015   History of prostatectomy 11/29/2015   Hyperlipidemia 03/24/2015    PCP: Alma Friendly NP  REFERRING PROVIDER: Alma Friendly NP  REFERRING DIAG: bilat hip pain  THERAPY DIAG:  Pain in left hip  Pain in right hip  Rationale for Evaluation and Treatment: Rehabilitation  ONSET DATE: Oct 2023  SUBJECTIVE:   SUBJECTIVE STATEMENT: Patient reports he had minimal pain [place] from [place], with some hip soreness bilat following walking in heavy boots on the sand.   PERTINENT HISTORY: Pt is a 72 year old male presenting with bilat hip pain beginning with insidious onset. Pt reports anteriolateral pain bilaterally. Feels achy in nature, "more of a muscle than joint or nerve pain". Denies n/t or radicular symptoms. Noticed recently that he is tilting forward when he walks because "I think this makes my pain a little less". Pain is aggravated by walking >1hour, sitting more than 56mns and laying on sides. Current pain 0/10; worst 6/10. Pt is retired, enjoys being active, bird watching and photography. Pt reports pain is elicited by walking >1 hour, laying on his hip. Pt is [facility]  [employer] and this does not elicit pain. Walks daily with his dog 1 mile. NO falls in past 6 months. Pt denies N/V, B&B changes, unexplained weight fluctuation, saddle paresthesia, fever, night sweats, or unrelenting night pain at this time.   PAIN:  Are you having pain? Yes: NPRS scale: 6/10 Pain location: bilat anteriolateral hips Pain description: achy, nagging Aggravating factors: walking >1hour, sitting more than 74mns and laying on sides Relieving factors: leaning forward, rest  PRECAUTIONS: None  WEIGHT BEARING RESTRICTIONS: No  FALLS:  Has patient fallen in last 6  months? No  LIVING ENVIRONMENT: Lives with: lives with their spouse Lives in: House/apartment Stairs: Yes: External: 1 steps; none Has following equipment at home: None  OCCUPATION: retired  PLOF: Independent  PATIENT GOALS: decrease pain  NEXT MD VISIT:   OBJECTIVE:   DIAGNOSTIC FINDINGS: Xray bilat hips negative  PATIENT SURVEYS:  FOTO 60 goal of 744 COGNITION: Overall cognitive status: Within functional limits for tasks assessed     SENSATION: WFL   MUSCLE LENGTH: Hamstrings: 25% limited bilat  POSTURE: thoracic kyphosis, FHRS, decreased lumbar lordosis  PALPATION: TTP with concordant pain to deep palpation over piriformis  LOWER EXTREMITY ROM:  Active ROM Right eval Left eval  Hip flexion WNL WNL  Hip extension WNL WNL  Hip abduction WNL WNL  Hip adduction    Hip internal rotation 25% limited 25% limited  Hip external rotation WNL WNL  Knee flexion WNL WNL  Knee extension WNL WNL  Ankle dorsiflexion    Ankle plantarflexion    Ankle inversion    Ankle eversion     (Blank rows = not tested)  LOWER EXTREMITY MMT:  MMT Right eval Left eval  Hip flexion 4- 4-  Hip extension 4- 4-  Hip abduction 4 4  Hip adduction    Hip internal rotation 5 5  Hip external rotation 5 5  Knee flexion 5 5  Knee extension 5 5  Ankle dorsiflexion    Ankle plantarflexion    Ankle inversion    Ankle eversion     (Blank rows = not tested)  Plank test 37sec 1RM leg press 65# bilat  LOWER EXTREMITY SPECIAL TESTS:  Hip special tests: PSaralyn Pilar(FABER) test: negative, Thomas test: negative, Ober's test: negative, Ely's test: negative, and Hip scouring test: negative  FUNCTIONAL TESTS:  5x STS 11sec 30sec STS test: 14 stands 10MWT self selected: 1.1110m fastest: 1.327m6MWT 1390f65fth 6/10 NPRS  GAIT: Distance walked: 1390ft37fistive device utilized: None Level of assistance: Complete Independence Comments: antalgic compensated trendelenberg   TODAY'S  TREATMENT:                                                                                                                              DATE: 03/22/22   Nustep seat 10 UE 14 L3 SPM 80s for gentle mobility and hip strengthening  Wall sit to failure 43sec; with alt marching 2x 12 with  cuing for upright posture maintenance with L hip raise  Alt hip ext in plank from forearm position 2x 12; same position with alternating abd 2x 12  Standing hip hike L x15; with RLE swing x15  SLS deadmill abd 2x 12 bilat with cuing for LLE stability with RlE push   Seated glute stretch x30sec bilat  PATIENT EDUCATION:  Education details: Patient was educated on diagnosis, anatomy and pathology involved, prognosis, role of PT, and was given an HEP, demonstrating exercise with proper form following verbal and tactile cues, and was given a paper hand out to continue exercise at home. Pt was educated on and agreed to plan of care. Person educated: Patient Education method: Explanation, Demonstration, Verbal cues, and Handouts Education comprehension: verbalized understanding, returned demonstration, and verbal cues required  HOME EXERCISE PROGRAM:  [id number]  ASSESSMENT:  CLINICAL IMPRESSION: PT initiated therex progression for increased L hip endurance/stability. PT continued incorporation of core stability/endurance as well with success. Pt is able to comply with all cuing for proper technique of therex with good effort throughout session, minimal increase in pain, only muscle fatigue. PT will continue progression as able.    OBJECTIVE IMPAIRMENTS: Abnormal gait, decreased activity tolerance, decreased endurance, decreased mobility, difficulty walking, decreased ROM, decreased strength, increased fascial restrictions, impaired flexibility, improper body mechanics, and postural dysfunction.   ACTIVITY LIMITATIONS: carrying, lifting, bending, sitting, standing, squatting, and transfers  PARTICIPATION  LIMITATIONS: meal prep, driving, community activity, occupation, and church  PERSONAL FACTORS: Age, Past/current experiences, Time since onset of injury/illness/exacerbation, and 1 comorbidity: Atrial fib  are also affecting patient's functional outcome.   REHAB POTENTIAL: Good  CLINICAL DECISION MAKING: Evolving/moderate complexity  EVALUATION COMPLEXITY: Moderate   GOALS: Goals reviewed with patient? Yes  SHORT TERM GOALS: Target date: 04/18/22 Pt will be independent with HEP in order to improve strength and balance in order to decrease fall risk and improve function at home and work.  Baseline: HEP given  Goal status: INITIAL   LONG TERM GOALS: Target date: 05/25/22  Patient will increase FOTO score to 79 to demonstrate predicted increase in functional mobility to complete ADLs Baseline:  Goal status: INITIAL  2.  Pt will increase 6MWT by at least 42m(1668f in order to demonstrate clinically significant improvement in muscular endurance for  community ambulation  Baseline: [id number] status: INITIAL  3.  Pt will decrease worst pain as reported on NPRS by at least 3 points in order to demonstrate clinically significant reduction in pain. Baseline: 6/10 Goal status: INITIAL  4.  Pt will demonstrate gross hip MMT of 4+ in order to be able to complete all household ADLs Baseline: see eval Goal status: INITIAL     PLAN:  PT FREQUENCY: 1-2x/week  PT DURATION: 8 weeks  PLANNED INTERVENTIONS: Therapeutic exercises, Therapeutic activity, Neuromuscular re-education, Balance training, Gait training, Patient/Family education, Self Care, Joint mobilization, Joint manipulation, Stair training, Aquatic Therapy, Dry Needling, Electrical stimulation, Spinal manipulation, Spinal mobilization, Cryotherapy, Moist heat, Traction, Ultrasound, Manual therapy, and Re-evaluation  PLAN FOR NEXT SESSION:   CheDurwin RegesT CheDurwin RegesT 04/02/2022, 3:54 PM

## 2022-04-04 ENCOUNTER — Ambulatory Visit: Payer: Medicare Other | Admitting: Physical Therapy

## 2022-04-04 ENCOUNTER — Encounter: Payer: Self-pay | Admitting: Physical Therapy

## 2022-04-04 DIAGNOSIS — M25552 Pain in left hip: Secondary | ICD-10-CM

## 2022-04-04 DIAGNOSIS — M25551 Pain in right hip: Secondary | ICD-10-CM

## 2022-04-04 DIAGNOSIS — G8929 Other chronic pain: Secondary | ICD-10-CM | POA: Diagnosis not present

## 2022-04-04 DIAGNOSIS — M25511 Pain in right shoulder: Secondary | ICD-10-CM | POA: Diagnosis not present

## 2022-04-04 MED ORDER — TYPHOID VACCINE PO CPDR: DELAYED_RELEASE_CAPSULE | ORAL | 0 refills | Status: DC

## 2022-04-04 NOTE — Therapy (Signed)
OUTPATIENT PHYSICAL THERAPY LOWER EXTREMITY TREATMENT   Patient Name: Brian Glass MRN: [id number] DOB:1951/03/16, 72 y.o., male Today's Date: 04/04/2022  END OF SESSION:  PT End of Session - 04/04/22 1346     Visit Number 4    Number of Visits 17    Date for PT Re-Evaluation 05/25/22    Authorization - Visit Number 4    Authorization - Number of Visits 17    Progress Note Due on Visit 10    PT Start Time 4270    PT Stop Time 1425    PT Time Calculation (min) 40 min    Activity Tolerance Patient tolerated treatment well    Behavior During Therapy [facility] for tasks assessed/performed                Past Medical History:  Diagnosis Date   Allergy    Arthritis    Atrial fibrillation ([place])    BCC (basal cell carcinoma) 11/08/2021   right medial cheek, Mohs 12/06/21   Bilateral vitreous detachment    Chickenpox    Hx of basal cell carcinoma 2018   L malar cheek   Prostate cancer ([place]) 2020   Typical atrial flutter ([place])    Past Surgical History:  Procedure Laterality Date   ATRIAL FIBRILLATION ABLATION N/A 05/10/2016   Procedure: Atrial Fibrillation Ablation;  Surgeon: [name], MD;  Location: [place] [facility];  Service: Cardiovascular;  Laterality: N/A;   CATARACT EXTRACTION, BILATERAL  2016   COLONOSCOPY WITH PROPOFOL N/A 10/20/2018   Procedure: COLONOSCOPY WITH PROPOFOL;  Surgeon: Lin Landsman, MD;  Location: [facility] ENDOSCOPY;  Service: Gastroenterology;  Laterality: N/A;   EYE SURGERY     MOHS SURGERY Left 2018   malar cheek, BCC   ROBOT ASSISTED LAPAROSCOPIC RADICAL PROSTATECTOMY N/A 04/06/2019   Procedure: XI ROBOTIC ASSISTED LAPAROSCOPIC RADICAL PROSTATECTOMY;  Surgeon: Billey Co, MD;  Location: [facility] ORS;  Service: Urology;  Laterality: N/A;   TONSILLECTOMY  1955   Patient Active Problem List   Diagnosis Date Noted   Chronic hip pain 02/15/2022   Acute pain of right shoulder 02/15/2022   Thrombocytopenia ([place]) 11/10/2019   History of  prostate cancer 04/06/2019   Encounter for screening colonoscopy 10/01/2018   Wart 11/28/2017   Medicare annual wellness visit, subsequent 03/23/2016   Paroxysmal atrial fibrillation ([place]) 11/29/2015   History of prostatectomy 11/29/2015   Hyperlipidemia 03/24/2015    PCP: Alma Friendly NP  REFERRING PROVIDER: Alma Friendly NP  REFERRING DIAG: bilat hip pain  THERAPY DIAG:  Pain in left hip  Pain in right hip  Rationale for Evaluation and Treatment: Rehabilitation  ONSET DATE: Oct 2023  SUBJECTIVE:   SUBJECTIVE STATEMENT: Patient reports he had minimal pain [place] and from [place], with some hip soreness bilat following walking in heavy boots on the sand.   PERTINENT HISTORY: Pt is a 72 year old male presenting with bilat hip pain beginning with insidious onset. Pt reports anteriolateral pain bilaterally. Feels achy in nature, "more of a muscle than joint or nerve pain". Denies n/t or radicular symptoms. Noticed recently that he is tilting forward when he walks because "I think this makes my pain a little less". Pain is aggravated by walking >1hour, sitting more than 11mns and laying on sides. Current pain 0/10; worst 6/10. Pt is retired, enjoys being active, bird watching and photography. Pt reports pain is elicited by walking >1 hour, laying on his hip. Pt is [facility] [employer], and this does not elicit pain. Walks daily with his dog 1 mile. NO falls in past 6 months. Pt denies N/V, B&B changes, unexplained weight fluctuation, saddle paresthesia, fever, night sweats, or unrelenting night pain at this time.   PAIN:  Are you having pain? Yes: NPRS scale: 6/10 Pain location: bilat anteriolateral hips Pain description: achy, nagging Aggravating factors: walking >1hour, sitting more than 59mns and laying on sides Relieving factors: leaning forward, rest  PRECAUTIONS: None  WEIGHT BEARING RESTRICTIONS: No  FALLS:  Has patient fallen in  last 6 months? No  LIVING ENVIRONMENT: Lives with: lives with their spouse Lives in: House/apartment Stairs: Yes: External: 1 steps; none Has following equipment at home: None  OCCUPATION: retired  PLOF: Independent  PATIENT GOALS: decrease pain  NEXT MD VISIT:   OBJECTIVE:   DIAGNOSTIC FINDINGS: Xray bilat hips negative  PATIENT SURVEYS:  FOTO 60 goal of 786 COGNITION: Overall cognitive status: Within functional limits for tasks assessed     SENSATION: WFL   MUSCLE LENGTH: Hamstrings: 25% limited bilat  POSTURE: thoracic kyphosis, FHRS, decreased lumbar lordosis  PALPATION: TTP with concordant pain to deep palpation over piriformis  LOWER EXTREMITY ROM:  Active ROM Right eval Left eval  Hip flexion WNL WNL  Hip extension WNL WNL  Hip abduction WNL WNL  Hip adduction    Hip internal rotation 25% limited 25% limited  Hip external rotation WNL WNL  Knee flexion WNL WNL  Knee extension WNL WNL  Ankle dorsiflexion    Ankle plantarflexion    Ankle inversion    Ankle eversion     (Blank rows = not tested)  LOWER EXTREMITY MMT:  MMT Right eval Left eval  Hip flexion 4- 4-  Hip extension 4- 4-  Hip abduction 4 4  Hip adduction    Hip internal rotation 5 5  Hip external rotation 5 5  Knee flexion 5 5  Knee extension 5 5  Ankle dorsiflexion    Ankle plantarflexion    Ankle inversion    Ankle eversion     (Blank rows = not tested)  Plank test 37sec 1RM leg press 65# bilat  LOWER EXTREMITY SPECIAL TESTS:  Hip special tests: PSaralyn Pilar(FABER) test: negative, Thomas test: negative, Ober's test: negative, Ely's test: negative, and Hip scouring test: negative  FUNCTIONAL TESTS:  5x STS 11sec 30sec STS test: 14 stands 10MWT self selected: 1.162m fastest: 1.3271m6MWT 1390f26fth 6/10 NPRS  GAIT: Distance walked: 1390ft63fistive device utilized: None Level of assistance: Complete Independence Comments: antalgic compensated  trendelenberg   TODAY'S TREATMENT:                                                                                                                              DATE: 03/22/22   Nustep seat 10 UE 14 L3 > 5 SPM 80s for gentle mobility and hip strengthening  Wall sit with alt marching 2x 12 with  cuing for upright posture maintenance \  Squat to chair with 10# DB at 90d UE flex for increased core activation with good carry over x12; with 2sec pause at bottom x9  Farmers carry with 20# KB suspended from grey TB x26f bilat; x1077fbilat with good carry over for core contraction  Alt hip ext in plank from forearm position 2x 12; same position with alternating abd 2x 12  SL hip hinge to cone touch 2x 8e with min cuing for hip hinge > thoracic kyphosis  Hip hike CLLE swing x15  Seated glute stretch x30sec bilat  PATIENT EDUCATION:  Education details: Patient was educated on diagnosis, anatomy and pathology involved, prognosis, role of PT, and was given an HEP, demonstrating exercise with proper form following verbal and tactile cues, and was given a paper hand out to continue exercise at home. Pt was educated on and agreed to plan of care. Person educated: Patient Education method: Explanation, Demonstration, Verbal cues, and Handouts Education comprehension: verbalized understanding, returned demonstration, and verbal cues required  HOME EXERCISE PROGRAM:  [id number]:  CLINICAL IMPRESSION: PT initiated therex progression for increased L hip endurance/stability. Pt is able to comply with all cuing for proper technique of therex with good effort throughout session. Patient demonstrates better dynamic SL balance with hip hinge exercise > last attempt. No pain throughout session. PT will continue progression as able.    OBJECTIVE IMPAIRMENTS: Abnormal gait, decreased activity tolerance, decreased endurance, decreased mobility, difficulty walking, decreased ROM, decreased strength,  increased fascial restrictions, impaired flexibility, improper body mechanics, and postural dysfunction.   ACTIVITY LIMITATIONS: carrying, lifting, bending, sitting, standing, squatting, and transfers  PARTICIPATION LIMITATIONS: meal prep, driving, community activity, occupation, and church  PERSONAL FACTORS: Age, Past/current experiences, Time since onset of injury/illness/exacerbation, and 1 comorbidity: Atrial fib  are also affecting patient's functional outcome.   REHAB POTENTIAL: Good  CLINICAL DECISION MAKING: Evolving/moderate complexity  EVALUATION COMPLEXITY: Moderate   GOALS: Goals reviewed with patient? Yes  SHORT TERM GOALS: Target date: 04/18/22 Pt will be independent with HEP in order to improve strength and balance in order to decrease fall risk and improve function at home and work.  Baseline: HEP given  Goal status: INITIAL   LONG TERM GOALS: Target date: 05/25/22  Patient will increase FOTO score to 79 to demonstrate predicted increase in functional mobility to complete ADLs Baseline:  Goal status: INITIAL  2.  Pt will increase 6MWT by at least [id number] order to demonstrate clinically significant improvement in muscular endurance for  community ambulation  Baseline: [id number] status: INITIAL  3.  Pt will decrease worst pain as reported on NPRS by at least 3 points in order to demonstrate clinically significant reduction in pain. Baseline: 6/10 Goal status: INITIAL  4.  Pt will demonstrate gross hip MMT of 4+ in order to be able to complete all household ADLs Baseline: see eval Goal status: INITIAL     PLAN:  PT FREQUENCY: 1-2x/week  PT DURATION: 8 weeks  PLANNED INTERVENTIONS: Therapeutic exercises, Therapeutic activity, Neuromuscular re-education, Balance training, Gait training, Patient/Family education, Self Care, Joint mobilization, Joint manipulation, Stair training, Aquatic Therapy, Dry Needling, Electrical stimulation, Spinal  manipulation, Spinal mobilization, Cryotherapy, Moist heat, Traction, Ultrasound, Manual therapy, and Re-evaluation  PLAN FOR NEXT SESSION:   ChelsDurwin RegesChelsDurwin Reges1/17/2024, 2:31 PM

## 2022-04-09 ENCOUNTER — Ambulatory Visit: Payer: Medicare Other | Admitting: Physical Therapy

## 2022-04-09 ENCOUNTER — Encounter: Payer: Self-pay | Admitting: Physical Therapy

## 2022-04-09 DIAGNOSIS — M25511 Pain in right shoulder: Secondary | ICD-10-CM | POA: Diagnosis not present

## 2022-04-09 DIAGNOSIS — G8929 Other chronic pain: Secondary | ICD-10-CM | POA: Diagnosis not present

## 2022-04-09 DIAGNOSIS — M25551 Pain in right hip: Secondary | ICD-10-CM | POA: Diagnosis not present

## 2022-04-09 DIAGNOSIS — M25552 Pain in left hip: Secondary | ICD-10-CM | POA: Diagnosis not present

## 2022-04-09 NOTE — Therapy (Signed)
OUTPATIENT PHYSICAL THERAPY LOWER EXTREMITY TREATMENT   Patient Name: Brian Glass MRN: [id number] DOB:[birth year], 72 y.o., male Today's Date: 04/11/2022  END OF SESSION:        Past Medical History:  Diagnosis Date   Allergy    Arthritis    Atrial fibrillation ([place])    BCC (basal cell carcinoma) 11/08/2021   right medial cheek, Mohs 12/06/21   Bilateral vitreous detachment    Chickenpox    Hx of basal cell carcinoma 2018   L malar cheek   Prostate cancer ([place]) 2020   Typical atrial flutter ([place])    Past Surgical History:  Procedure Laterality Date   ATRIAL FIBRILLATION ABLATION N/A 05/10/2016   Procedure: Atrial Fibrillation Ablation;  Surgeon: [name], MD;  Location: [place] [facility];  Service: Cardiovascular;  Laterality: N/A;   CATARACT EXTRACTION, BILATERAL  2016   COLONOSCOPY WITH PROPOFOL N/A 10/20/2018   Procedure: COLONOSCOPY WITH PROPOFOL;  Surgeon: Lin Landsman, MD;  Location: [facility];  Service: Gastroenterology;  Laterality: N/A;   EYE SURGERY     MOHS SURGERY Left 2018   malar cheek, BCC   ROBOT ASSISTED LAPAROSCOPIC RADICAL PROSTATECTOMY N/A 04/06/2019   Procedure: XI ROBOTIC ASSISTED LAPAROSCOPIC RADICAL PROSTATECTOMY;  Surgeon: Billey Co, MD;  Location: [facility];  Service: Urology;  Laterality: N/A;   TONSILLECTOMY  1955   Patient Active Problem List   Diagnosis Date Noted   Chronic hip pain 02/15/2022   Acute pain of right shoulder 02/15/2022   Thrombocytopenia ([place]) 11/10/2019   History of prostate cancer 04/06/2019   Encounter for screening colonoscopy 10/01/2018   Wart 11/28/2017   Medicare annual wellness visit, subsequent 03/23/2016   Paroxysmal atrial fibrillation ([place]) 11/29/2015   History of prostatectomy 11/29/2015   Hyperlipidemia 03/24/2015    PCP: Alma Friendly NP  REFERRING PROVIDER: Alma Friendly NP  REFERRING DIAG: bilat hip pain  THERAPY DIAG:  Pain in left hip  Rationale for Evaluation and  Treatment: Rehabilitation  ONSET DATE: Oct 2023  SUBJECTIVE:   SUBJECTIVE STATEMENT: Patient  walked his dog before coming in and that his R glute and the anterior of his L hip started having fatigue after walking 300 yards.   PERTINENT HISTORY: Pt is a 72 year old male presenting with bilat hip pain beginning with insidious onset. Pt reports anteriolateral pain bilaterally. Feels achy in nature, "more of a muscle than joint or nerve pain". Denies n/t or radicular symptoms. Noticed recently that he is tilting forward when he walks because "I think this makes my pain a little less". Pain is aggravated by walking >1hour, sitting more than 35mns and laying on sides. Current pain 0/10; worst 6/10. Pt is retired, enjoys being active, bird watching and photography. Pt reports pain is elicited by walking >1 hour, laying on his hip. Pt is [facility] [employer] and this does not elicit pain. Walks daily with his dog 1 mile. NO falls in past 6 months. Pt denies N/V, B&B changes, unexplained weight fluctuation, saddle paresthesia, fever, night sweats, or unrelenting night pain at this time.   PAIN:  Are you having pain? Yes: NPRS scale: 6/10 Pain location: bilat anteriolateral hips Pain description: achy, nagging Aggravating factors: walking >1hour, sitting more than 330ms and laying on sides Relieving factors: leaning forward, rest  PRECAUTIONS: None  WEIGHT BEARING RESTRICTIONS: No  FALLS:  Has patient fallen in last 6 months? No  LIVING ENVIRONMENT: Lives with: lives with their spouse Lives  in: House/apartment Stairs: Yes: External: 1 steps; none Has following equipment at home: None  OCCUPATION: retired  PLOF: Independent  PATIENT GOALS: decrease pain  NEXT MD VISIT:   OBJECTIVE:   DIAGNOSTIC FINDINGS: Xray bilat hips negative  PATIENT SURVEYS:  FOTO 60 goal of 31  COGNITION: Overall cognitive status: Within functional limits for tasks  assessed     SENSATION: WFL   MUSCLE LENGTH: Hamstrings: 25% limited bilat  POSTURE: thoracic kyphosis, FHRS, decreased lumbar lordosis  PALPATION: TTP with concordant pain to deep palpation over piriformis  LOWER EXTREMITY ROM:  Active ROM Right eval Left eval  Hip flexion WNL WNL  Hip extension WNL WNL  Hip abduction WNL WNL  Hip adduction    Hip internal rotation 25% limited 25% limited  Hip external rotation WNL WNL  Knee flexion WNL WNL  Knee extension WNL WNL  Ankle dorsiflexion    Ankle plantarflexion    Ankle inversion    Ankle eversion     (Blank rows = not tested)  LOWER EXTREMITY MMT:  MMT Right eval Left eval  Hip flexion 4- 4-  Hip extension 4- 4-  Hip abduction 4 4  Hip adduction    Hip internal rotation 5 5  Hip external rotation 5 5  Knee flexion 5 5  Knee extension 5 5  Ankle dorsiflexion    Ankle plantarflexion    Ankle inversion    Ankle eversion     (Blank rows = not tested)  Plank test 37sec 1RM leg press 65# bilat  LOWER EXTREMITY SPECIAL TESTS:  Hip special tests: Saralyn Pilar (FABER) test: negative, Thomas test: negative, Ober's test: negative, Ely's test: negative, and Hip scouring test: negative  FUNCTIONAL TESTS:  5x STS 11sec 30sec STS test: 14 stands 10MWT self selected: 1.51ms fastest: 1.312m 6MWT [id number] 6/10 NPRS  GAIT: Distance walked: [id number] device utilized: None Level of assistance: Complete Independence Comments: antalgic compensated trendelenberg   TODAY'S TREATMENT:                                                                                                                              DATE: 03/22/22   Nustep seat 10 UE 14 L3 > 5 SPM 80s for gentle mobility and hip strengthening  Wall sit with alt marching 2x 12 with cuing for upright posture maintenance \  Squat to chair with 10# DB at 90d UE flex for increased core activation with good carry over x12;   Single leg squat to elevated chair  RLE 2x 8; LLE 2x 5 with significantly increased difficulty with LLE squat  Single leg bridge to failure L: 40 R: 44   In wall sit unilateral march R: 10 (to failure) L: 14  Palloff anti rotation 5# x12 bilat; 10# x12 bilat  TA pull down (with lat pulldown bar) 2x 12 20#   Seated glute stretch x30sec bilat  PATIENT EDUCATION:  Education details: Patient was educated on diagnosis,  anatomy and pathology involved, prognosis, role of PT, and was given an HEP, demonstrating exercise with proper form following verbal and tactile cues, and was given a paper hand out to continue exercise at home. Pt was educated on and agreed to plan of care. Person educated: Patient Education method: Explanation, Demonstration, Verbal cues, and Handouts Education comprehension: verbalized understanding, returned demonstration, and verbal cues required  HOME EXERCISE PROGRAM:  [id number]  ASSESSMENT:  CLINICAL IMPRESSION: PT initiated therex progression for increased L hip endurance/stability. Pt is able to comply with all cuing for proper technique of therex with good effort throughout session. Pt with mention of pelvic obliquity (shorter LLE), PT advised patient to continue to wear shoe lift to accommodate this when walking long distances.  No pain throughout session. PT will continue progression as able.    OBJECTIVE IMPAIRMENTS: Abnormal gait, decreased activity tolerance, decreased endurance, decreased mobility, difficulty walking, decreased ROM, decreased strength, increased fascial restrictions, impaired flexibility, improper body mechanics, and postural dysfunction.   ACTIVITY LIMITATIONS: carrying, lifting, bending, sitting, standing, squatting, and transfers  PARTICIPATION LIMITATIONS: meal prep, driving, community activity, occupation, and church  PERSONAL FACTORS: Age, Past/current experiences, Time since onset of injury/illness/exacerbation, and 1 comorbidity: Atrial fib  are also affecting  patient's functional outcome.   REHAB POTENTIAL: Good  CLINICAL DECISION MAKING: Evolving/moderate complexity  EVALUATION COMPLEXITY: Moderate   GOALS: Goals reviewed with patient? Yes  SHORT TERM GOALS: Target date: 04/18/22 Pt will be independent with HEP in order to improve strength and balance in order to decrease fall risk and improve function at home and work.  Baseline: HEP given  Goal status: INITIAL   LONG TERM GOALS: Target date: 05/25/22  Patient will increase FOTO score to 79 to demonstrate predicted increase in functional mobility to complete ADLs Baseline:  Goal status: INITIAL  2.  Pt will increase 6MWT by at least 76m(1614f in order to demonstrate clinically significant improvement in muscular endurance for  community ambulation  Baseline: [id number] status: INITIAL  3.  Pt will decrease worst pain as reported on NPRS by at least 3 points in order to demonstrate clinically significant reduction in pain. Baseline: 6/10 Goal status: INITIAL  4.  Pt will demonstrate gross hip MMT of 4+ in order to be able to complete all household ADLs Baseline: see eval Goal status: INITIAL     PLAN:  PT FREQUENCY: 1-2x/week  PT DURATION: 8 weeks  PLANNED INTERVENTIONS: Therapeutic exercises, Therapeutic activity, Neuromuscular re-education, Balance training, Gait training, Patient/Family education, Self Care, Joint mobilization, Joint manipulation, Stair training, Aquatic Therapy, Dry Needling, Electrical stimulation, Spinal manipulation, Spinal mobilization, Cryotherapy, Moist heat, Traction, Ultrasound, Manual therapy, and Re-evaluation  PLAN FOR NEXT SESSION:   CheDurwin RegesT CheDurwin RegesT 04/11/2022, 9:56 AM

## 2022-04-10 ENCOUNTER — Other Ambulatory Visit (INDEPENDENT_AMBULATORY_CARE_PROVIDER_SITE_OTHER): Payer: Medicare Other

## 2022-04-10 DIAGNOSIS — Z0184 Encounter for antibody response examination: Secondary | ICD-10-CM

## 2022-04-11 ENCOUNTER — Encounter: Payer: Medicare Other | Admitting: Physical Therapy

## 2022-04-11 LAB — HEPATITIS A ANTIBODY, TOTAL: Hepatitis A AB,Total: NONREACTIVE

## 2022-04-11 LAB — HEPATITIS B SURFACE ANTIBODY, QUANTITATIVE: Hep B S AB Quant (Post): <5 m[IU]/mL — ABNORMAL LOW (ref >=10)

## 2022-04-12 ENCOUNTER — Ambulatory Visit: Payer: Medicare Other | Admitting: Physical Therapy

## 2022-04-12 ENCOUNTER — Encounter: Payer: Self-pay | Admitting: Physical Therapy

## 2022-04-12 ENCOUNTER — Ambulatory Visit (INDEPENDENT_AMBULATORY_CARE_PROVIDER_SITE_OTHER): Payer: Medicare Other

## 2022-04-12 DIAGNOSIS — Z23 Encounter for immunization: Secondary | ICD-10-CM | POA: Diagnosis not present

## 2022-04-12 DIAGNOSIS — Z0184 Encounter for antibody response examination: Secondary | ICD-10-CM

## 2022-04-12 DIAGNOSIS — M25552 Pain in left hip: Secondary | ICD-10-CM

## 2022-04-12 DIAGNOSIS — M25511 Pain in right shoulder: Secondary | ICD-10-CM | POA: Diagnosis not present

## 2022-04-12 DIAGNOSIS — G8929 Other chronic pain: Secondary | ICD-10-CM | POA: Diagnosis not present

## 2022-04-12 DIAGNOSIS — M25551 Pain in right hip: Secondary | ICD-10-CM

## 2022-04-12 NOTE — Therapy (Signed)
OUTPATIENT PHYSICAL THERAPY LOWER EXTREMITY TREATMENT   Patient Name: Brian Glass MRN: [id number] DOB:May 07, 1950, 72 y.o., male Today's Date: 04/12/2022  END OF SESSION:  PT End of Session - 04/12/22 1341     Visit Number 6    Number of Visits 17    Date for PT Re-Evaluation 05/25/22    Authorization - Visit Number 6    Authorization - Number of Visits 17    Progress Note Due on Visit 10    PT Start Time 1340    PT Stop Time 1419    PT Time Calculation (min) 39 min    Activity Tolerance Patient tolerated treatment well    Behavior During Therapy WFL for tasks assessed/performed                  Past Medical History:  Diagnosis Date   Allergy    Arthritis    Atrial fibrillation ([place])    BCC (basal cell carcinoma) 11/08/2021   right medial cheek, Mohs 12/06/21   Bilateral vitreous detachment    Chickenpox    Hx of basal cell carcinoma 2018   L malar cheek   Prostate cancer ([place]) 2020   Typical atrial flutter ([place])    Past Surgical History:  Procedure Laterality Date   ATRIAL FIBRILLATION ABLATION N/A 05/10/2016   Procedure: Atrial Fibrillation Ablation;  Surgeon: [name], MD;  Location: [place] [facility];  Service: Cardiovascular;  Laterality: N/A;   CATARACT EXTRACTION, BILATERAL  2016   COLONOSCOPY WITH PROPOFOL N/A 10/20/2018   Procedure: COLONOSCOPY WITH PROPOFOL;  Surgeon: Lin Landsman, MD;  Location: [facility] END[facility]COPY;  Service: Gastroenterology;  Laterality: N/A;   EYE SURGERY     MOHS SURGERY Left 2018   malar cheek, BCC   ROBOT ASSISTED LAPAROSCOPIC RADICAL PROSTATECTOMY N/A 04/06/2019   Procedure: XI ROBOTIC ASSISTED LAPAROSCOPIC RADICAL PROSTATECTOMY;  Surgeon: Billey Co, MD;  Location: [facility];  Service: Urology;  Laterality: N/A;   TONSILLECTOMY  1955   Patient Active Problem List   Diagnosis Date Noted   Chronic hip pain 02/15/2022   Acute pain of right shoulder 02/15/2022   Thrombocytopenia ([place]) 11/10/2019   History of  prostate cancer 04/06/2019   Encounter for screening colonoscopy 10/01/2018   Wart 11/28/2017   Medicare annual wellness visit, subsequent 03/23/2016   Paroxysmal atrial fibrillation ([place]) 11/29/2015   History of prostatectomy 11/29/2015   Hyperlipidemia 03/24/2015    PCP: Alma Friendly NP  REFERRING PROVIDER: Alma Friendly NP  REFERRING DIAG: bilat hip pain  THERAPY DIAG:  Pain in left hip  Pain in right hip  Rationale for Evaluation and Treatment: Rehabilitation  ONSET DATE: Oct 2023  SUBJECTIVE:   SUBJECTIVE STATEMENT: Patient reports doing well overall. "I feel a stretch in the front of my R hip with I take a big step with my L foot". Completing HEP without question or concern.  PERTINENT HISTORY: Pt is a 72 year old male presenting with bilat hip pain beginning with insidious onset. Pt reports anteriolateral pain bilaterally. Feels achy in nature, "more of a muscle than joint or nerve pain". Denies n/t or radicular symptoms. Noticed recently that he is tilting forward when he walks because "I think this makes my pain a little less". Pain is aggravated by walking >1hour, sitting more than 54mns and laying on sides. Current pain 0/10; worst 6/10. Pt is retired, enjoys being active, bird watching and photography. Pt reports pain is elicited by walking >1 hour, laying on his  hip. Pt is [facility] [employer], and this does not elicit pain. Walks daily with his dog 1 mile. NO falls in past 6 months. Pt denies N/V, B&B changes, unexplained weight fluctuation, saddle paresthesia, fever, night sweats, or unrelenting night pain at this time.  R anterior hip pain L posterior hip pin    PAIN:  Are you having pain? Yes: NPRS scale: 6/10 Pain location: bilat anteriolateral hips Pain description: achy, nagging Aggravating factors: walking >1hour, sitting more than 78mns and laying on sides Relieving factors: leaning forward, rest  PRECAUTIONS: None  WEIGHT  BEARING RESTRICTIONS: No  FALLS:  Has patient fallen in last 6 months? No  LIVING ENVIRONMENT: Lives with: lives with their spouse Lives in: House/apartment Stairs: Yes: External: 1 steps; none Has following equipment at home: None  OCCUPATION: retired  PLOF: Independent  PATIENT GOALS: decrease pain  NEXT MD VISIT:   OBJECTIVE:   DIAGNOSTIC FINDINGS: Xray bilat hips negative  PATIENT SURVEYS:  FOTO 60 goal of 756 COGNITION: Overall cognitive status: Within functional limits for tasks assessed     SENSATION: WFL   MUSCLE LENGTH: Hamstrings: 25% limited bilat  POSTURE: thoracic kyphosis, FHRS, decreased lumbar lordosis  PALPATION: TTP with concordant pain to deep palpation over piriformis  LOWER EXTREMITY ROM:  Active ROM Right eval Left eval  Hip flexion WNL WNL  Hip extension WNL WNL  Hip abduction WNL WNL  Hip adduction    Hip internal rotation 25% limited 25% limited  Hip external rotation WNL WNL  Knee flexion WNL WNL  Knee extension WNL WNL  Ankle dorsiflexion    Ankle plantarflexion    Ankle inversion    Ankle eversion     (Blank rows = not tested)  LOWER EXTREMITY MMT:  MMT Right eval Left eval  Hip flexion 4- 4-  Hip extension 4- 4-  Hip abduction 4 4  Hip adduction    Hip internal rotation 5 5  Hip external rotation 5 5  Knee flexion 5 5  Knee extension 5 5  Ankle dorsiflexion    Ankle plantarflexion    Ankle inversion    Ankle eversion     (Blank rows = not tested)  Plank test 37sec 1RM leg press 65# bilat  LOWER EXTREMITY SPECIAL TESTS:  Hip special tests: PSaralyn Pilar(FABER) test: negative, Thomas test: negative, Ober's test: negative, Ely's test: negative, and Hip scouring test: negative  FUNCTIONAL TESTS:  5x STS 11sec 30sec STS test: 14 stands 10MWT self selected: 1.137m fastest: 1.3258m6MWT [id number] 6/10 NPRS  GAIT: Distance walked: [id number] device utilized: None Level of assistance: Complete  Independence Comments: antalgic compensated trendelenberg   TODAY'S TREATMENT:                                                                                                                              DATE: 04/11/22   Nustep seat 10 UE 14 L5 SPM  80s for gentle mobility and hip strengthening  Long sitting L foot over 6in cone (and back) x12 R SL bridge x15 with min cuing for glute activation with good carry over  Long sitting L foot over 6in cone (and back) x12 R SL bridge x15 with min cuing for glute activation with good carry over  R hip flex from mat table (approx 90d hip flex) x12 Plantigrade L hip ext x12  *L seated glute stretch 30sec  R hip flex from mat table (approx 90d hip flex) x12 Plantigrade L hip ext x12  *L seated glute stretch 30sec  Plank with alt hip ext 2x 12 with cuing for initial set up of plank without pelvic rotation with good carry over  Farmers carry 20# KB suspended from grey tband with perturbations from therapist to weight 2 x1109f each UE with cuing throughout for core activation/stability with good carry over  Simultaneous R hip flex L glute max contract relax x10sec x3; mnual thomas stretch x30sc      PATIENT EDUCATION:  Education details: Patient was educated on diagnosis, anatomy and pathology involved, prognosis, role of PT, and was given an HEP, demonstrating exercise with proper form following verbal and tactile cues, and was given a paper hand out to continue exercise at home. Pt was educated on and agreed to plan of care. Person educated: Patient Education method: Explanation, Demonstration, Verbal cues, and Handouts Education comprehension: verbalized understanding, returned demonstration, and verbal cues required  HOME EXERCISE PROGRAM:  [id number] ASSESSMENT:  CLINICAL IMPRESSION: PT initiated therex progression for increased L hip endurance/stability. Pt is able to comply with all cuing for proper technique of therex with good  effort throughout session. Pt with no increased pain throughout session. PT will continue progression as able.    OBJECTIVE IMPAIRMENTS: Abnormal gait, decreased activity tolerance, decreased endurance, decreased mobility, difficulty walking, decreased ROM, decreased strength, increased fascial restrictions, impaired flexibility, improper body mechanics, and postural dysfunction.   ACTIVITY LIMITATIONS: carrying, lifting, bending, sitting, standing, squatting, and transfers  PARTICIPATION LIMITATIONS: meal prep, driving, community activity, occupation, and church  PERSONAL FACTORS: Age, Past/current experiences, Time since onset of injury/illness/exacerbation, and 1 comorbidity: Atrial fib  are also affecting patient's functional outcome.   REHAB POTENTIAL: Good  CLINICAL DECISION MAKING: Evolving/moderate complexity  EVALUATION COMPLEXITY: Moderate   GOALS: Goals reviewed with patient? Yes  SHORT TERM GOALS: Target date: 04/18/22 Pt will be independent with HEP in order to improve strength and balance in order to decrease fall risk and improve function at home and work.  Baseline: HEP given  Goal status: INITIAL   LONG TERM GOALS: Target date: 05/25/22  Patient will increase FOTO score to 79 to demonstrate predicted increase in functional mobility to complete ADLs Baseline:  Goal status: INITIAL  2.  Pt will increase 6MWT by at least [id number] order to demonstrate clinically significant improvement in muscular endurance for  community ambulation  Baseline: [id number] status: INITIAL  3.  Pt will decrease worst pain as reported on NPRS by at least 3 points in order to demonstrate clinically significant reduction in pain. Baseline: 6/10 Goal status: INITIAL  4.  Pt will demonstrate gross hip MMT of 4+ in order to be able to complete all household ADLs Baseline: see eval Goal status: INITIAL  5. Pt will be able to demonstrate at least 41 L single leg bridges to  demonstrate 90% muscle endurance of RLE needed for increased standing/walking tolerance.   04/12/22 39  PLAN:  PT FREQUENCY: 1-2x/week  PT DURATION: 8 weeks  PLANNED INTERVENTIONS: Therapeutic exercises, Therapeutic activity, Neuromuscular re-education, Balance training, Gait training, Patient/Family education, Self Care, Joint mobilization, Joint manipulation, Stair training, Aquatic Therapy, Dry Needling, Electrical stimulation, Spinal manipulation, Spinal mobilization, Cryotherapy, Moist heat, Traction, Ultrasound, Manual therapy, and Re-evaluation  PLAN FOR NEXT SESSION:   Durwin Reges DPT Durwin Reges, PT 04/12/2022, 2:23 PM

## 2022-04-16 ENCOUNTER — Encounter: Payer: Self-pay | Admitting: Physical Therapy

## 2022-04-16 ENCOUNTER — Ambulatory Visit: Payer: Medicare Other | Admitting: Physical Therapy

## 2022-04-16 DIAGNOSIS — M25552 Pain in left hip: Secondary | ICD-10-CM | POA: Diagnosis not present

## 2022-04-16 DIAGNOSIS — M25511 Pain in right shoulder: Secondary | ICD-10-CM | POA: Diagnosis not present

## 2022-04-16 DIAGNOSIS — M25551 Pain in right hip: Secondary | ICD-10-CM

## 2022-04-16 DIAGNOSIS — G8929 Other chronic pain: Secondary | ICD-10-CM | POA: Diagnosis not present

## 2022-04-16 NOTE — Therapy (Signed)
OUTPATIENT PHYSICAL THERAPY LOWER EXTREMITY TREATMENT   Patient Name: Brian Glass MRN: [id number] DOB:12-03-1950, 72 y.o., male Today's Date: 04/16/2022  END OF SESSION:  PT End of Session - 04/16/22 1125     Visit Number 7    Number of Visits 17    Date for PT Re-Evaluation 05/25/22    Authorization - Visit Number 7    Authorization - Number of Visits 17    Progress Note Due on Visit 10    PT Start Time 1125    PT Stop Time 1205    PT Time Calculation (min) 40 min    Activity Tolerance Patient tolerated treatment well    Behavior During Therapy [facility] for tasks assessed/performed                  Past Medical History:  Diagnosis Date   Allergy    Arthritis    Atrial fibrillation ([place])    BCC (basal cell carcinoma) 11/08/2021   right medial cheek, Mohs 12/06/21   Bilateral vitreous detachment    Chickenpox    Hx of basal cell carcinoma 2018   L malar cheek   Prostate cancer ([place]) 2020   Typical atrial flutter ([place])    Past Surgical History:  Procedure Laterality Date   ATRIAL FIBRILLATION ABLATION N/A 05/10/2016   Procedure: Atrial Fibrillation Ablation;  Surgeon: [name], MD;  Location: [place] [facility];  Service: Cardiovascular;  Laterality: N/A;   CATARACT EXTRACTION, BILATERAL  2016   COLONOSCOPY WITH PROPOFOL N/A 10/20/2018   Procedure: COLONOSCOPY WITH PROPOFOL;  Surgeon: Lin Landsman, MD;  Location: [facility] ENDOSCOPY;  Service: Gastroenterology;  Laterality: N/A;   EYE SURGERY     MOHS SURGERY Left 2018   malar cheek, BCC   ROBOT ASSISTED LAPAROSCOPIC RADICAL PROSTATECTOMY N/A 04/06/2019   Procedure: XI ROBOTIC ASSISTED LAPAROSCOPIC RADICAL PROSTATECTOMY;  Surgeon: Billey Co, MD;  Location: [facility] ORS;  Service: Urology;  Laterality: N/A;   TONSILLECTOMY  1955   Patient Active Problem List   Diagnosis Date Noted   Chronic hip pain 02/15/2022   Acute pain of right shoulder 02/15/2022   Thrombocytopenia ([place]) 11/10/2019   History of  prostate cancer 04/06/2019   Encounter for screening colonoscopy 10/01/2018   Wart 11/28/2017   Medicare annual wellness visit, subsequent 03/23/2016   Paroxysmal atrial fibrillation ([place]) 11/29/2015   History of prostatectomy 11/29/2015   Hyperlipidemia 03/24/2015    PCP: Alma Friendly NP  REFERRING PROVIDER: Alma Friendly NP  REFERRING DIAG: bilat hip pain  THERAPY DIAG:  Pain in left hip  Pain in right hip  Rationale for Evaluation and Treatment: Rehabilitation  ONSET DATE: Oct 2023  SUBJECTIVE:   SUBJECTIVE STATEMENT: Pt reports he feels like he is getting better at SLS and stability therex. Did have some pain with wlking the other day that made him wondering if he is having a setback. No ant hip pain this am, some aching pain to L hip 4/10.   PERTINENT HISTORY: Pt is a 72 year old male presenting with bilat hip pain beginning with insidious onset. Pt reports anteriolateral pain bilaterally. Feels achy in nature, "more of a muscle than joint or nerve pain". Denies n/t or radicular symptoms. Noticed recently that he is tilting forward when he walks because "I think this makes my pain a little less". Pain is aggravated by walking >1hour, sitting more than 5mns and laying on sides. Current pain 0/10; worst 6/10. Pt is retired, enjoys being active, bird  watching and photography. Pt reports pain is elicited by walking >1 hour, laying on his hip. Pt is [facility] [employer] and this does not elicit pain. Walks daily with his dog 1 mile. NO falls in past 6 months. Pt denies N/V, B&B changes, unexplained weight fluctuation, saddle paresthesia, fever, night sweats, or unrelenting night pain at this time.  R anterior hip pain L posterior hip pin    PAIN:  Are you having pain? Yes: NPRS scale: 6/10 Pain location: bilat anteriolateral hips Pain description: achy, nagging Aggravating factors: walking >1hour, sitting more than 65mns and laying on  sides Relieving factors: leaning forward, rest  PRECAUTIONS: None  WEIGHT BEARING RESTRICTIONS: No  FALLS:  Has patient fallen in last 6 months? No  LIVING ENVIRONMENT: Lives with: lives with their spouse Lives in: House/apartment Stairs: Yes: External: 1 steps; none Has following equipment at home: None  OCCUPATION: retired  PLOF: Independent  PATIENT GOALS: decrease pain  NEXT MD VISIT:   OBJECTIVE:   DIAGNOSTIC FINDINGS: Xray bilat hips negative  PATIENT SURVEYS:  FOTO 60 goal of 755 COGNITION: Overall cognitive status: Within functional limits for tasks assessed     SENSATION: WFL   MUSCLE LENGTH: Hamstrings: 25% limited bilat  POSTURE: thoracic kyphosis, FHRS, decreased lumbar lordosis  PALPATION: TTP with concordant pain to deep palpation over piriformis  LOWER EXTREMITY ROM:  Active ROM Right eval Left eval  Hip flexion WNL WNL  Hip extension WNL WNL  Hip abduction WNL WNL  Hip adduction    Hip internal rotation 25% limited 25% limited  Hip external rotation WNL WNL  Knee flexion WNL WNL  Knee extension WNL WNL  Ankle dorsiflexion    Ankle plantarflexion    Ankle inversion    Ankle eversion     (Blank rows = not tested)  LOWER EXTREMITY MMT:  MMT Right eval Left eval  Hip flexion 4- 4-  Hip extension 4- 4-  Hip abduction 4 4  Hip adduction    Hip internal rotation 5 5  Hip external rotation 5 5  Knee flexion 5 5  Knee extension 5 5  Ankle dorsiflexion    Ankle plantarflexion    Ankle inversion    Ankle eversion     (Blank rows = not tested)  Plank test 37sec 1RM leg press 65# bilat  LOWER EXTREMITY SPECIAL TESTS:  Hip special tests: PSaralyn Pilar(FABER) test: negative, Thomas test: negative, Ober's test: negative, Ely's test: negative, and Hip scouring test: negative  FUNCTIONAL TESTS:  5x STS 11sec 30sec STS test: 14 stands 10MWT self selected: 1.19m fastest: 1.3279m6MWT [id number] 6/10 NPRS  GAIT: Distance walked:  [id number] device utilized: None Level of assistance: Complete Independence Comments: antalgic compensated trendelenberg   TODAY'S TREATMENT:  DATE: 04/11/22   Nustep seat 10 UE 14 L5 SPM 80s for gentle mobility and hip strengthening  Leg length discrepancy test LLE 3cm shorter than R Treadmill walking- slight abnormality at 9% grade, much more defined L hip vault at uphill 15% grade amb Education on foot lift to aid in discomfort with this with understanding  R SLS squat to failure: 4 L SLS squat to failure: unable from standard seat  SL Squat from elevated mat table 2x 6 bilat   Bridge with marching 2x 12 with cuing for core/hip stabilization  Alt spider plank from high plnk x12; from forearms 2x 12  Simultaneous R hip flex L glute max contract relax x10sec x3; mnual thomas stretch x30sc      PATIENT EDUCATION:  Education details: Patient was educated on diagnosis, anatomy and pathology involved, prognosis, role of PT, and was given an HEP, demonstrating exercise with proper form following verbal and tactile cues, and was given a paper hand out to continue exercise at home. Pt was educated on and agreed to plan of care. Person educated: Patient Education method: Explanation, Demonstration, Verbal cues, and Handouts Education comprehension: verbalized understanding, returned demonstration, and verbal cues required  HOME EXERCISE PROGRAM:  [id number]  ASSESSMENT:  CLINICAL IMPRESSION: PT initiated therex progression for increased bilat  hip endurance/stability. Education on shoe lift to accommodate LLD to decrease pain  with prolonged amublation with understanding. Pt is able to comply with all cuing for proper technique of therex with good effort throughout session. Pt with no increased pain throughout session. PT will continue progression as able.     OBJECTIVE IMPAIRMENTS: Abnormal gait, decreased activity tolerance, decreased endurance, decreased mobility, difficulty walking, decreased ROM, decreased strength, increased fascial restrictions, impaired flexibility, improper body mechanics, and postural dysfunction.   ACTIVITY LIMITATIONS: carrying, lifting, bending, sitting, standing, squatting, and transfers  PARTICIPATION LIMITATIONS: meal prep, driving, community activity, occupation, and church  PERSONAL FACTORS: Age, Past/current experiences, Time since onset of injury/illness/exacerbation, and 1 comorbidity: Atrial fib  are also affecting patient's functional outcome.   REHAB POTENTIAL: Good  CLINICAL DECISION MAKING: Evolving/moderate complexity  EVALUATION COMPLEXITY: Moderate   GOALS: Goals reviewed with patient? Yes  SHORT TERM GOALS: Target date: 04/18/22 Pt will be independent with HEP in order to improve strength and balance in order to decrease fall risk and improve function at home and work.  Baseline: HEP given  Goal status: INITIAL   LONG TERM GOALS: Target date: 05/25/22  Patient will increase FOTO score to 79 to demonstrate predicted increase in functional mobility to complete ADLs Baseline:  Goal status: INITIAL  2.  Pt will increase 6MWT by at least 88m(1652f in order to demonstrate clinically significant improvement in muscular endurance for  community ambulation  Baseline: [id number] status: INITIAL  3.  Pt will decrease worst pain as reported on NPRS by at least 3 points in order to demonstrate clinically significant reduction in pain. Baseline: 6/10 Goal status: INITIAL  4.  Pt will demonstrate gross hip MMT of 4+ in order to be able to complete all household ADLs Baseline: see eval Goal status: INITIAL  5. Pt will be able to demonstrate at least 41 L single leg bridges to demonstrate 90% muscle endurance of RLE needed for increased standing/walking tolerance.   04/12/22  39     PLAN:  PT FREQUENCY: 1-2x/week  PT DURATION: 8 weeks  PLANNED INTERVENTIONS: Therapeutic exercises, Therapeutic activity, Neuromuscular re-education, Balance training, Gait training, Patient/Family education, Self Care,  Joint mobilization, Joint manipulation, Stair training, Aquatic Therapy, Dry Needling, Electrical stimulation, Spinal manipulation, Spinal mobilization, Cryotherapy, Moist heat, Traction, Ultrasound, Manual therapy, and Re-evaluation  PLAN FOR NEXT SESSION:   Durwin Reges DPT Durwin Reges, PT 04/16/2022, 12:06 PM

## 2022-04-18 ENCOUNTER — Encounter: Payer: Medicare Other | Admitting: Physical Therapy

## 2022-04-23 ENCOUNTER — Ambulatory Visit: Payer: Medicare Other | Attending: Primary Care | Admitting: Physical Therapy

## 2022-04-23 ENCOUNTER — Encounter: Payer: Self-pay | Admitting: Physical Therapy

## 2022-04-23 DIAGNOSIS — M25552 Pain in left hip: Secondary | ICD-10-CM | POA: Insufficient documentation

## 2022-04-23 DIAGNOSIS — M25551 Pain in right hip: Secondary | ICD-10-CM | POA: Insufficient documentation

## 2022-04-23 NOTE — Therapy (Signed)
OUTPATIENT PHYSICAL THERAPY LOWER EXTREMITY TREATMENT   Patient Name: Brian Glass MRN: [id number] DOB:1951/01/23, 72 y.o., male Today's Date: 04/24/2022  END OF SESSION:  PT End of Session - 04/23/22 1038     Visit Number 8    Number of Visits 17    Date for PT Re-Evaluation 05/25/22    Authorization - Visit Number 8    Authorization - Number of Visits 17    Progress Note Due on Visit 10    PT Start Time 4259    PT Stop Time 1118    PT Time Calculation (min) 38 min    Activity Tolerance Patient tolerated treatment well    Behavior During Therapy WFL for tasks assessed/performed                   Past Medical History:  Diagnosis Date   Allergy    Arthritis    Atrial fibrillation ([place])    BCC (basal cell carcinoma) 11/08/2021   right medial cheek, Mohs 12/06/21   Bilateral vitreous detachment    Chickenpox    Hx of basal cell carcinoma 2018   L malar cheek   Prostate cancer ([place]) 2020   Typical atrial flutter ([place])    Past Surgical History:  Procedure Laterality Date   ATRIAL FIBRILLATION ABLATION N/A 05/10/2016   Procedure: Atrial Fibrillation Ablation;  Surgeon: [name], MD;  Location: [place] [facility];  Service: Cardiovascular;  Laterality: N/A;   CATARACT EXTRACTION, BILATERAL  2016   COLONOSCOPY WITH PROPOFOL N/A 10/20/2018   Procedure: COLONOSCOPY WITH PROPOFOL;  Surgeon: Lin Landsman, MD;  Location: [facility] ENDOSCOPY;  Service: Gastroenterology;  Laterality: N/A;   EYE SURGERY     MOHS SURGERY Left 2018   malar cheek, BCC   ROBOT ASSISTED LAPAROSCOPIC RADICAL PROSTATECTOMY N/A 04/06/2019   Procedure: XI ROBOTIC ASSISTED LAPAROSCOPIC RADICAL PROSTATECTOMY;  Surgeon: Billey Co, MD;  Location: [facility] ORS;  Service: Urology;  Laterality: N/A;   TONSILLECTOMY  1955   Patient Active Problem List   Diagnosis Date Noted   Chronic hip pain 02/15/2022   Acute pain of right shoulder 02/15/2022   Thrombocytopenia ([place]) 11/10/2019   History of  prostate cancer 04/06/2019   Encounter for screening colonoscopy 10/01/2018   Wart 11/28/2017   Medicare annual wellness visit, subsequent 03/23/2016   Paroxysmal atrial fibrillation ([place]) 11/29/2015   History of prostatectomy 11/29/2015   Hyperlipidemia 03/24/2015    PCP: Alma Friendly NP  REFERRING PROVIDER: Alma Friendly NP  REFERRING DIAG: bilat hip pain  THERAPY DIAG:  Pain in left hip  Pain in right hip  Rationale for Evaluation and Treatment: Rehabilitation  ONSET DATE: Oct 2023  SUBJECTIVE:   SUBJECTIVE STATEMENT: Pt reports he had some hip pain in the am that subsided in about 3100f steps. Pt reports continuing to have pain after walking 15-270ms but that it is 4/10 in both hips, "probably less severe than before"  PERTINENT HISTORY: Pt is a [age]ear old male presenting with bilat hip pain beginning with insidious onset. Pt reports anteriolateral pain bilaterally. Feels achy in nature, "more of a muscle than joint or nerve pain". Denies n/t or radicular symptoms. Noticed recently that he is tilting forward when he walks because "I think this makes my pain a little less". Pain is aggravated by walking >1hour, sitting more than 3081m and laying on sides. Current pain 0/10; worst 6/10. Pt is retired, enjoys being active, bird watching and photography. Pt reports pain is elicited  by walking >1 hour, laying on his hip. Pt is [facility] [employer], and this does not elicit pain. Walks daily with his dog 1 mile. NO falls in past 6 months. Pt denies N/V, B&B changes, unexplained weight fluctuation, saddle paresthesia, fever, night sweats, or unrelenting night pain at this time.  R anterior hip pain L posterior hip pin    PAIN:  Are you having pain? Yes: NPRS scale: 6/10 Pain location: bilat anteriolateral hips Pain description: achy, nagging Aggravating factors: walking >1hour, sitting more than 5mns and laying on sides Relieving factors: leaning  forward, rest  PRECAUTIONS: None  WEIGHT BEARING RESTRICTIONS: No  FALLS:  Has patient fallen in last 6 months? No  LIVING ENVIRONMENT: Lives with: lives with their spouse Lives in: House/apartment Stairs: Yes: External: 1 steps; none Has following equipment at home: None  OCCUPATION: retired  PLOF: Independent  PATIENT GOALS: decrease pain  NEXT MD VISIT:   OBJECTIVE:   DIAGNOSTIC FINDINGS: Xray bilat hips negative  PATIENT SURVEYS:  FOTO 60 goal of 712 COGNITION: Overall cognitive status: Within functional limits for tasks assessed     SENSATION: WFL   MUSCLE LENGTH: Hamstrings: 25% limited bilat  POSTURE: thoracic kyphosis, FHRS, decreased lumbar lordosis  PALPATION: TTP with concordant pain to deep palpation over piriformis  LOWER EXTREMITY ROM:  Active ROM Right eval Left eval  Hip flexion WNL WNL  Hip extension WNL WNL  Hip abduction WNL WNL  Hip adduction    Hip internal rotation 25% limited 25% limited  Hip external rotation WNL WNL  Knee flexion WNL WNL  Knee extension WNL WNL  Ankle dorsiflexion    Ankle plantarflexion    Ankle inversion    Ankle eversion     (Blank rows = not tested)  LOWER EXTREMITY MMT:  MMT Right eval Left eval  Hip flexion 4- 4-  Hip extension 4- 4-  Hip abduction 4 4  Hip adduction    Hip internal rotation 5 5  Hip external rotation 5 5  Knee flexion 5 5  Knee extension 5 5  Ankle dorsiflexion    Ankle plantarflexion    Ankle inversion    Ankle eversion     (Blank rows = not tested)  Plank test 37sec 1RM leg press 65# bilat  LOWER EXTREMITY SPECIAL TESTS:  Hip special tests: PSaralyn Pilar(FABER) test: negative, Thomas test: negative, Ober's test: negative, Ely's test: negative, and Hip scouring test: negative  FUNCTIONAL TESTS:  5x STS 11sec 30sec STS test: 14 stands 10MWT self selected: 1.172m fastest: 1.3256m6MWT 1390f9fth 6/10 NPRS  GAIT: Distance walked: 1390ft24fistive device  utilized: None Level of assistance: Complete Independence Comments: antalgic compensated trendelenberg   TODAY'S TREATMENT:                                                                                                                              DATE: 04/11/22  Nustep seat 10 UE 14 L5 SPM 80s for gentle mobility and hip strengthening  Total gym SL squat 2x 8 bilat; more difficulty with L side d/t fatigue  Palloff autorotation 5# 2x 10 with min cuing initially for technique/eccentric control with good carry over  Bridge with marching x12; from bosu ball (hardside) 2x 12   Alt spider plank  from forearms 2x 12  Bird dog 2x 12 with patient reporting increased fatigue with LLE leg lift over R  Simultaneous R hip flex L glute max contract relax x10sec x3; mnual thomas stretch x30sc      PATIENT EDUCATION:  Education details: Patient was educated on diagnosis, anatomy and pathology involved, prognosis, role of PT, and was given an HEP, demonstrating exercise with proper form following verbal and tactile cues, and was given a paper hand out to continue exercise at home. Pt was educated on and agreed to plan of care. Person educated: Patient Education method: Explanation, Demonstration, Verbal cues, and Handouts Education comprehension: verbalized understanding, returned demonstration, and verbal cues required  HOME EXERCISE PROGRAM:  [id number]  ASSESSMENT:  CLINICAL IMPRESSION: PT initiated therex progression for increased bilat  hip endurance/stability.  Pt is able to comply with all cuing for proper technique of therex with good effort throughout session. Pt with no increased pain throughout session. PT will continue progression as able.    OBJECTIVE IMPAIRMENTS: Abnormal gait, decreased activity tolerance, decreased endurance, decreased mobility, difficulty walking, decreased ROM, decreased strength, increased fascial restrictions, impaired flexibility, improper body mechanics,  and postural dysfunction.   ACTIVITY LIMITATIONS: carrying, lifting, bending, sitting, standing, squatting, and transfers  PARTICIPATION LIMITATIONS: meal prep, driving, community activity, occupation, and church  PERSONAL FACTORS: Age, Past/current experiences, Time since onset of injury/illness/exacerbation, and 1 comorbidity: Atrial fib  are also affecting patient's functional outcome.   REHAB POTENTIAL: Good  CLINICAL DECISION MAKING: Evolving/moderate complexity  EVALUATION COMPLEXITY: Moderate   GOALS: Goals reviewed with patient? Yes  SHORT TERM GOALS: Target date: 04/18/22 Pt will be independent with HEP in order to improve strength and balance in order to decrease fall risk and improve function at home and work.  Baseline: HEP given  Goal status: INITIAL   LONG TERM GOALS: Target date: 05/25/22  Patient will increase FOTO score to 79 to demonstrate predicted increase in functional mobility to complete ADLs Baseline:  Goal status: INITIAL  2.  Pt will increase 6MWT by at least 66m(1685f in order to demonstrate clinically significant improvement in muscular endurance for  community ambulation  Baseline: [id number] status: INITIAL  3.  Pt will decrease worst pain as reported on NPRS by at least 3 points in order to demonstrate clinically significant reduction in pain. Baseline: 6/10 Goal status: INITIAL  4.  Pt will demonstrate gross hip MMT of 4+ in order to be able to complete all household ADLs Baseline: see eval Goal status: INITIAL  5. Pt will be able to demonstrate at least 41 L single leg bridges to demonstrate 90% muscle endurance of RLE needed for increased standing/walking tolerance.   04/12/22 39     PLAN:  PT FREQUENCY: 1-2x/week  PT DURATION: 8 weeks  PLANNED INTERVENTIONS: Therapeutic exercises, Therapeutic activity, Neuromuscular re-education, Balance training, Gait training, Patient/Family education, Self Care, Joint mobilization, Joint  manipulation, Stair training, Aquatic Therapy, Dry Needling, Electrical stimulation, Spinal manipulation, Spinal mobilization, Cryotherapy, Moist heat, Traction, Ultrasound, Manual therapy, and Re-evaluation  PLAN FOR NEXT SESSION:   CheDurwin RegesT CheDurwin RegesT 04/24/2022, 10:40  AM

## 2022-04-25 ENCOUNTER — Encounter: Payer: Medicare Other | Admitting: Physical Therapy

## 2022-04-26 ENCOUNTER — Ambulatory Visit: Payer: Medicare Other | Admitting: Physical Therapy

## 2022-04-26 ENCOUNTER — Encounter: Payer: Self-pay | Admitting: Physical Therapy

## 2022-04-26 DIAGNOSIS — M25551 Pain in right hip: Secondary | ICD-10-CM | POA: Diagnosis not present

## 2022-04-26 DIAGNOSIS — M25552 Pain in left hip: Secondary | ICD-10-CM

## 2022-04-26 NOTE — Therapy (Signed)
OUTPATIENT PHYSICAL THERAPY LOWER EXTREMITY TREATMENT   Patient Name: Brian Glass MRN: [id number] DOB:[birth year], 72 y.o., male Today's Date: 04/26/2022  END OF SESSION:  PT End of Session - 04/26/22 1354     Visit Number 9    Number of Visits 17    Date for PT Re-Evaluation 05/25/22    Authorization - Visit Number 9    Authorization - Number of Visits 17    Progress Note Due on Visit 10    PT Start Time 2831    PT Stop Time 1427    PT Time Calculation (min) 39 min    Activity Tolerance Patient tolerated treatment well    Behavior During Therapy WFL for tasks assessed/performed                    Past Medical History:  Diagnosis Date   Allergy    Arthritis    Atrial fibrillation ([place])    BCC (basal cell carcinoma) 11/08/2021   right medial cheek, Mohs 12/06/21   Bilateral vitreous detachment    Chickenpox    Hx of basal cell carcinoma 2018   L malar cheek   Prostate cancer ([place]) 2020   Typical atrial flutter ([place])    Past Surgical History:  Procedure Laterality Date   ATRIAL FIBRILLATION ABLATION N/A 05/10/2016   Procedure: Atrial Fibrillation Ablation;  Surgeon: [name], MD;  Location: [place] [facility];  Service: Cardiovascular;  Laterality: N/A;   CATARACT EXTRACTION, BILATERAL  2016   COLONOSCOPY WITH PROPOFOL N/A 10/20/2018   Procedure: COLONOSCOPY WITH PROPOFOL;  Surgeon: Lin Landsman, MD;  Location: [facility] ENDOSCOPY;  Service: Gastroenterology;  Laterality: N/A;   EYE SURGERY     MOHS SURGERY Left 2018   malar cheek, BCC   ROBOT ASSISTED LAPAROSCOPIC RADICAL PROSTATECTOMY N/A 04/06/2019   Procedure: XI ROBOTIC ASSISTED LAPAROSCOPIC RADICAL PROSTATECTOMY;  Surgeon: Billey Co, MD;  Location: [facility];  Service: Urology;  Laterality: N/A;   TONSILLECTOMY  1955   Patient Active Problem List   Diagnosis Date Noted   Chronic hip pain 02/15/2022   Acute pain of right shoulder 02/15/2022   Thrombocytopenia ([place]) 11/10/2019   History of  prostate cancer 04/06/2019   Encounter for screening colonoscopy 10/01/2018   Wart 11/28/2017   Medicare annual wellness visit, subsequent 03/23/2016   Paroxysmal atrial fibrillation ([place]) 11/29/2015   History of prostatectomy 11/29/2015   Hyperlipidemia 03/24/2015    PCP: Alma Friendly NP  REFERRING PROVIDER: Alma Friendly NP  REFERRING DIAG: bilat hip pain  THERAPY DIAG:  Pain in left hip  Pain in right hip  Rationale for Evaluation and Treatment: Rehabilitation  ONSET DATE: Oct 2023  SUBJECTIVE:   SUBJECTIVE STATEMENT: Pt reports getting shoe lift and wearing around the house. Is completing HEP. Had some pain this am at L gluteal cleft. No pian currently.   PERTINENT HISTORY: Pt is a 72 year old male presenting with bilat hip pain beginning with insidious onset. Pt reports anteriolateral pain bilaterally. Feels achy in nature, "more of a muscle than joint or nerve pain". Denies n/t or radicular symptoms. Noticed recently that he is tilting forward when he walks because "I think this makes my pain a little less". Pain is aggravated by walking >1hour, sitting more than 50mns and laying on sides. Current pain 0/10; worst 6/10. Pt is retired, enjoys being active, bird watching and photography. Pt reports pain is elicited by walking >1 hour, laying on his hip. Pt is [facility] [employer] and this does not elicit pain. Walks daily with his dog 1 mile. NO falls in past 6 months. Pt denies N/V, B&B changes, unexplained weight fluctuation, saddle paresthesia, fever, night sweats, or unrelenting night pain at this time.  R anterior hip pain L posterior hip pin    PAIN:  Are you having pain? Yes: NPRS scale: 6/10 Pain location: bilat anteriolateral hips Pain description: achy, nagging Aggravating factors: walking >1hour, sitting more than 47mns and laying on sides Relieving factors: leaning forward, rest  PRECAUTIONS: None  WEIGHT BEARING RESTRICTIONS:  No  FALLS:  Has patient fallen in last 6 months? No  LIVING ENVIRONMENT: Lives with: lives with their spouse Lives in: House/apartment Stairs: Yes: External: 1 steps; none Has following equipment at home: None  OCCUPATION: retired  PLOF: Independent  PATIENT GOALS: decrease pain  NEXT MD VISIT:   OBJECTIVE:   DIAGNOSTIC FINDINGS: Xray bilat hips negative  PATIENT SURVEYS:  FOTO 60 goal of 72 COGNITION: Overall cognitive status: Within functional limits for tasks assessed     SENSATION: WFL   MUSCLE LENGTH: Hamstrings: 25% limited bilat  POSTURE: thoracic kyphosis, FHRS, decreased lumbar lordosis  PALPATION: TTP with concordant pain to deep palpation over piriformis  LOWER EXTREMITY ROM:  Active ROM Right eval Left eval  Hip flexion WNL WNL  Hip extension WNL WNL  Hip abduction WNL WNL  Hip adduction    Hip internal rotation 25% limited 25% limited  Hip external rotation WNL WNL  Knee flexion WNL WNL  Knee extension WNL WNL  Ankle dorsiflexion    Ankle plantarflexion    Ankle inversion    Ankle eversion     (Blank rows = not tested)  LOWER EXTREMITY MMT:  MMT Right eval Left eval  Hip flexion 4- 4-  Hip extension 4- 4-  Hip abduction 4 4  Hip adduction    Hip internal rotation 5 5  Hip external rotation 5 5  Knee flexion 5 5  Knee extension 5 5  Ankle dorsiflexion    Ankle plantarflexion    Ankle inversion    Ankle eversion     (Blank rows = not tested)  Plank test 37sec 1RM leg press 65# bilat  LOWER EXTREMITY SPECIAL TESTS:  Hip special tests: PSaralyn Pilar(FABER) test: negative, Thomas test: negative, Ober's test: negative, Ely's test: negative, and Hip scouring test: negative  FUNCTIONAL TESTS:  5x STS 11sec 30sec STS test: 14 stands 10MWT self selected: 1.150m fastest: 1.3225m6MWT 1390f78fth 6/10 NPRS  GAIT: Distance walked: 1390ft87fistive device utilized: None Level of assistance: Complete Independence Comments:  antalgic compensated trendelenberg   TODAY'S TREATMENT:                                                                                                                              DATE: 04/11/22   Walking with shoe lift 2cm on RLE 2min 39mmph; 58mn 5%5m  grade; 74mn 0% grade 1.553m with no pain other than L proximal hamstring/glute fatigue  With palpation pain isolated to L prox hamstring  Straight leg deadlift 20# KB x12  OMEGA hip hinge with resistance from lowest position 35# x12; 45# x10 with good carry over of initial cuing  Alt marching on foam pad with 15# DB overhead LUE x12; RUE x12; min cuing for posture and use of core activation with good carry over *patient reporting increased L oblique effort with L overhead hold  L lateral bending gainst 20# KB in RUE x12; RUE farmer carry 30# KB suspended from grey tband 2x 10051fistance with min cuing at end of 100f85fr posture with good carry over  Simultaneous R hip flex L glute max contract relax x10sec x3; mnual thomas stretch x30sc      PATIENT EDUCATION:  Education details: Patient was educated on diagnosis, anatomy and pathology involved, prognosis, role of PT, and was given an HEP, demonstrating exercise with proper form following verbal and tactile cues, and was given a paper hand out to continue exercise at home. Pt was educated on and agreed to plan of care. Person educated: Patient Education method: Explanation, Demonstration, Verbal cues, and Handouts Education comprehension: verbalized understanding, returned demonstration, and verbal cues required  HOME EXERCISE PROGRAM:  [id number]:  CLINICAL IMPRESSION: PT initiated therex progression for increased bilat  hip endurance/stability.  Pt wore shoe lift throughout session, with visible improvement in hip stability through gait with this. Pt demonstrating quicker fatigue with L lateral trunk flexion > R, which coincides with iliopsoas deficit. Pt is able to  comply with all cuing for proper technique of therex with good effort throughout session. Pt with no increased pain throughout session. PT will continue progression as able.    OBJECTIVE IMPAIRMENTS: Abnormal gait, decreased activity tolerance, decreased endurance, decreased mobility, difficulty walking, decreased ROM, decreased strength, increased fascial restrictions, impaired flexibility, improper body mechanics, and postural dysfunction.   ACTIVITY LIMITATIONS: carrying, lifting, bending, sitting, standing, squatting, and transfers  PARTICIPATION LIMITATIONS: meal prep, driving, community activity, occupation, and church  PERSONAL FACTORS: Age, Past/current experiences, Time since onset of injury/illness/exacerbation, and 1 comorbidity: Atrial fib  are also affecting patient's functional outcome.   REHAB POTENTIAL: Good  CLINICAL DECISION MAKING: Evolving/moderate complexity  EVALUATION COMPLEXITY: Moderate   GOALS: Goals reviewed with patient? Yes  SHORT TERM GOALS: Target date: 04/18/22 Pt will be independent with HEP in order to improve strength and balance in order to decrease fall risk and improve function at home and work.  Baseline: HEP given  Goal status: INITIAL   LONG TERM GOALS: Target date: 05/25/22  Patient will increase FOTO score to 79 to demonstrate predicted increase in functional mobility to complete ADLs Baseline:  Goal status: INITIAL  2.  Pt will increase 6MWT by at least 79m 11mft)23forder to demonstrate clinically significant improvement in muscular endurance for  community ambulation  Baseline: 1390ft  50f status: INITIAL  3.  Pt will decrease worst pain as reported on NPRS by at least 3 points in order to demonstrate clinically significant reduction in pain. Baseline: 6/10 Goal status: INITIAL  4.  Pt will demonstrate gross hip MMT of 4+ in order to be able to complete all household ADLs Baseline: see eval Goal status: INITIAL  5. Pt will be  able to demonstrate at least 41 L single leg bridges to demonstrate 90% muscle endurance of RLE needed for increased standing/walking tolerance.   04/12/22 39  PLAN:  PT FREQUENCY: 1-2x/week  PT DURATION: 8 weeks  PLANNED INTERVENTIONS: Therapeutic exercises, Therapeutic activity, Neuromuscular re-education, Balance training, Gait training, Patient/Family education, Self Care, Joint mobilization, Joint manipulation, Stair training, Aquatic Therapy, Dry Needling, Electrical stimulation, Spinal manipulation, Spinal mobilization, Cryotherapy, Moist heat, Traction, Ultrasound, Manual therapy, and Re-evaluation  PLAN FOR NEXT SESSION:   Durwin Reges DPT Durwin Reges, PT 04/26/2022, 2:35 PM

## 2022-04-30 ENCOUNTER — Ambulatory Visit: Payer: Medicare Other | Admitting: Physical Therapy

## 2022-04-30 DIAGNOSIS — M25552 Pain in left hip: Secondary | ICD-10-CM

## 2022-04-30 DIAGNOSIS — M25551 Pain in right hip: Secondary | ICD-10-CM

## 2022-04-30 NOTE — Therapy (Unsigned)
OUTPATIENT PHYSICAL THERAPY LOWER EXTREMITY TREATMENT/ Progress Note Reporting Period: 04/30/22   Patient Name: Brian Glass MRN: [id number] DOB:18-Sep-1950, 72 y.o., male Today's Date: 05/01/2022  END OF SESSION:  PT End of Session - 05/01/22 1057     Visit Number 10    Number of Visits 17    Date for PT Re-Evaluation 05/25/22    Authorization - Visit Number 10    Authorization - Number of Visits 17    Progress Note Due on Visit 10    PT Start Time [id number]    PT Stop Time 1430    PT Time Calculation (min) 45 min    Activity Tolerance Patient tolerated treatment well    Behavior During Therapy [facility] for tasks assessed/performed                     Past Medical History:  Diagnosis Date   Allergy    Arthritis    Atrial fibrillation ([place])    BCC (basal cell carcinoma) 11/08/2021   right medial cheek, Mohs 12/06/21   Bilateral vitreous detachment    Chickenpox    Hx of basal cell carcinoma 2018   L malar cheek   Prostate cancer ([place]) 2020   Typical atrial flutter ([place])    Past Surgical History:  Procedure Laterality Date   ATRIAL FIBRILLATION ABLATION N/A 05/10/2016   Procedure: Atrial Fibrillation Ablation;  Surgeon: [name], MD;  Location: [place] [facility];  Service: Cardiovascular;  Laterality: N/A;   CATARACT EXTRACTION, BILATERAL  2016   COLONOSCOPY WITH PROPOFOL N/A 10/20/2018   Procedure: COLONOSCOPY WITH PROPOFOL;  Surgeon: Lin Landsman, MD;  Location: [facility];  Service: Gastroenterology;  Laterality: N/A;   EYE SURGERY     MOHS SURGERY Left 2018   malar cheek, BCC   ROBOT ASSISTED LAPAROSCOPIC RADICAL PROSTATECTOMY N/A 04/06/2019   Procedure: XI ROBOTIC ASSISTED LAPAROSCOPIC RADICAL PROSTATECTOMY;  Surgeon: Billey Co, MD;  Location: [facility];  Service: Urology;  Laterality: N/A;   TONSILLECTOMY  1955   Patient Active Problem List   Diagnosis Date Noted   Chronic hip pain 02/15/2022   Acute pain of right shoulder 02/15/2022    Thrombocytopenia ([place]) 11/10/2019   History of prostate cancer 04/06/2019   Encounter for screening colonoscopy 10/01/2018   Wart 11/28/2017   Medicare annual wellness visit, subsequent 03/23/2016   Paroxysmal atrial fibrillation ([place]) 11/29/2015   History of prostatectomy 11/29/2015   Hyperlipidemia 03/24/2015    PCP: Alma Friendly NP  REFERRING PROVIDER: Alma Friendly NP  REFERRING DIAG: bilat hip pain  THERAPY DIAG:  Pain in left hip  Pain in right hip  Rationale for Evaluation and Treatment: Rehabilitation  ONSET DATE: Oct 2023  SUBJECTIVE:   SUBJECTIVE STATEMENT: Pt reports he has been wearing the shoe lift for longer distances on the trail. He began to have pin-point pain directly underneath his patella on RLE so he is unsure if the shoe lift is actually helping his bilat hip pain. HEP is continuing to go well. NPS: 1/10 currently.   PERTINENT HISTORY: Pt is a 72 year old male presenting with bilat hip pain beginning with insidious onset. Pt reports anteriolateral pain bilaterally. Feels achy in nature, "more of a muscle than joint or nerve pain". Denies n/t or radicular symptoms. Noticed recently that he is tilting forward when he walks because "I think this makes my pain a little less". Pain is aggravated by walking >1hour, sitting more than 24mns and laying on  sides. Current pain 0/10; worst 6/10. Pt is retired, enjoys being active, bird watching and photography. Pt reports pain is elicited by walking >1 hour, laying on his hip. Pt is [facility] [employer], and this does not elicit pain. Walks daily with his dog 1 mile. NO falls in past 6 months. Pt denies N/V, B&B changes, unexplained weight fluctuation, saddle paresthesia, fever, night sweats, or unrelenting night pain at this time.  R anterior hip pain L posterior hip pin    PAIN:  Are you having pain? Yes: NPRS scale: 6/10 Pain location: bilat anteriolateral hips Pain description: achy,  nagging Aggravating factors: walking >1hour, sitting more than 27mns and laying on sides Relieving factors: leaning forward, rest  PRECAUTIONS: None  WEIGHT BEARING RESTRICTIONS: No  FALLS:  Has patient fallen in last 6 months? No  LIVING ENVIRONMENT: Lives with: lives with their spouse Lives in: House/apartment Stairs: Yes: External: 1 steps; none Has following equipment at home: None  OCCUPATION: retired  PLOF: Independent  PATIENT GOALS: decrease pain  NEXT MD VISIT:   OBJECTIVE:   DIAGNOSTIC FINDINGS: Xray bilat hips negative  PATIENT SURVEYS:  FOTO 60 goal of 756 COGNITION: Overall cognitive status: Within functional limits for tasks assessed     SENSATION: WFL   MUSCLE LENGTH: Hamstrings: 25% limited bilat  POSTURE: thoracic kyphosis, FHRS, decreased lumbar lordosis  PALPATION: TTP with concordant pain to deep palpation over piriformis  LOWER EXTREMITY ROM:  Active ROM Right eval Left eval  Hip flexion WNL WNL  Hip extension WNL WNL  Hip abduction WNL WNL  Hip adduction    Hip internal rotation 25% limited 25% limited  Hip external rotation WNL WNL  Knee flexion WNL WNL  Knee extension WNL WNL  Ankle dorsiflexion    Ankle plantarflexion    Ankle inversion    Ankle eversion     (Blank rows = not tested)  LOWER EXTREMITY MMT:  MMT Right eval Left eval  Hip flexion 4- 4-  Hip extension 4- 4-  Hip abduction 4 4  Hip adduction    Hip internal rotation 5 5  Hip external rotation 5 5  Knee flexion 5 5  Knee extension 5 5  Ankle dorsiflexion    Ankle plantarflexion    Ankle inversion    Ankle eversion     (Blank rows = not tested)  Plank test 37sec 1RM leg press 65# bilat  LOWER EXTREMITY SPECIAL TESTS:  Hip special tests: PSaralyn Pilar(FABER) test: negative, Thomas test: negative, Ober's test: negative, Ely's test: negative, and Hip scouring test: negative  FUNCTIONAL TESTS:  5x STS 11sec 30sec STS test: 14 stands 10MWT self  selected: 1.165m fastest: 1.321m6MWT 1390f89fth 6/10 NPRS  GAIT: Distance walked: 1390ft47fistive device utilized: None Level of assistance: Complete Independence Comments: antalgic compensated trendelenberg   TODAY'S TREATMENT:  DATE: 04/30/22 Therex: - Hip flexor up and overs with orange cone x 12 (bilaterally) - Eccentric standing marches with 10# KB x 12 (bilaterally) - Clock sliders 6-way x 8 (bilaterally) - Hip flexor stretch 1 x 30 seconds  04/11/22   Walking with shoe lift 2cm on RLE 871mn 1.571m; 71m77m5% grade; 1mi871m% grade 1.5mph28mth no pain other than L proximal hamstring/glute fatigue  With palpation pain isolated to L prox hamstring  Straight leg deadlift 20# KB x12  OMEGA hip hinge with resistance from lowest position 35# x12; 45# x10 with good carry over of initial cuing  Alt marching on foam pad with 15# DB overhead LUE x12; RUE x12; min cuing for posture and use of core activation with good carry over *patient reporting increased L oblique effort with L overhead hold  L lateral bending gainst 20# KB in RUE x12; RUE farmer carry 30# KB suspended from grey tband 2x 100ft 104fance with min cuing at end of 100ft f6fosture with good carry over  Simultaneous R hip flex L glute max contract relax x10sec x3; mnual thomas stretch x30sc      PATIENT EDUCATION:  Education details: Patient was educated on diagnosis, anatomy and pathology involved, prognosis, role of PT, and was given an HEP, demonstrating exercise with proper form following verbal and tactile cues, and was given a paper hand out to continue exercise at home. Pt was educated on and agreed to plan of care. Person educated: Patient Education method: Explanation, Demonstration, Verbal cues, and Handouts Education comprehension: verbalized understanding, returned demonstration,  and verbal cues required  HOME EXERCISE PROGRAM:  [id number]:  CLINICAL IMPRESSION: PT continued therex progression for increased bilat hip endurance/stability. PT reassessed pt progress toward goals with paitnet demonstrating increased walking distance with shoe lift, decreased pain on NPRS; with remaining deficits in perceived function and glute max muscle endurance. PT and patient discussed decreasing the frequency of future sessions to 1x/week to increased independence toward d/c. PT educated pt on future therex geared towards bilat hip endurance and static/dynamic stabilization with success. Pt wore shoe lift throughout session. Pt demonstrating greater muscular fatigue with L standing marches > R, which coincides with iliopsoas deficit. Pt is able to comply with all demonstrations, VC, and TC for proper form and technique of therex with excellent effort throughout session. Pt with no increased pain throughout entire session. NPS: 0/10 at end of session. PT will continue progression as able. Pt would continue to benefit from skilled PT to promote optimal return to PLOF and ADLs without pain.   OBJECTIVE IMPAIRMENTS: Abnormal gait, decreased activity tolerance, decreased endurance, decreased mobility, difficulty walking, decreased ROM, decreased strength, increased fascial restrictions, impaired flexibility, improper body mechanics, and postural dysfunction.   ACTIVITY LIMITATIONS: carrying, lifting, bending, sitting, standing, squatting, and transfers  PARTICIPATION LIMITATIONS: meal prep, driving, community activity, occupation, and church  PERSONAL FACTORS: Age, Past/current experiences, Time since onset of injury/illness/exacerbation, and 1 comorbidity: Atrial fib  are also affecting patient's functional outcome.   REHAB POTENTIAL: Good  CLINICAL DECISION MAKING: Evolving/moderate complexity  EVALUATION COMPLEXITY: Moderate   GOALS: Goals reviewed with patient?  Yes  SHORT TERM GOALS: Target date: 04/18/22 Pt will be independent with HEP in order to improve strength and balance in order to decrease fall risk and improve function at home and work.  Baseline: HEP given  Goal status: INITIAL   LONG TERM GOALS: Target date: 05/25/22  Patient will increase FOTO score to 79 to demonstrate  predicted increase in functional mobility to complete ADLs Baseline: IE 68; 04/30/22: 72 Goal status: ONGOING  2.  Pt will increase 6MWT by at least 11m(1665f in order to demonstrate clinically significant improvement in muscular endurance for  community ambulation  Baseline: 139024f2/21/24: 1600f60fal status: MET  3.  Pt will decrease worst pain as reported on NPRS by at least 3 points in order to demonstrate clinically significant reduction in pain. Baseline: 6/10; 04/30/22: 2/10 Goal status: MET  4.  Pt will demonstrate gross hip MMT of 4+ in order to be able to complete all household ADLs Baseline: see eval Goal status: 04/30/22: 5/5 grossly  5. Pt will be able to demonstrate at least 41 L single leg bridges to demonstrate 90% muscle endurance of RLE needed for increased standing/walking tolerance.   04/12/22: 39 04/30/22: R: 25; L: 26  ONGOING    PLAN:  PT FREQUENCY: 1-2x/week  PT DURATION: 8 weeks  PLANNED INTERVENTIONS: Therapeutic exercises, Therapeutic activity, Neuromuscular re-education, Balance training, Gait training, Patient/Family education, Self Care, Joint mobilization, Joint manipulation, Stair training, Aquatic Therapy, Dry Needling, Electrical stimulation, Spinal manipulation, Spinal mobilization, Cryotherapy, Moist heat, Traction, Ultrasound, Manual therapy, and Re-evaluation  PLAN FOR NEXT SESSION:   ChelDurwin Reges ChelDurwin Reges 05/01/2022, 11:10 AM

## 2022-05-01 ENCOUNTER — Encounter: Payer: Self-pay | Admitting: Physical Therapy

## 2022-05-02 ENCOUNTER — Encounter: Payer: Medicare Other | Admitting: Physical Therapy

## 2022-05-03 ENCOUNTER — Ambulatory Visit: Payer: Medicare Other | Admitting: Physical Therapy

## 2022-05-07 ENCOUNTER — Encounter: Payer: Medicare Other | Admitting: Physical Therapy

## 2022-05-10 ENCOUNTER — Encounter: Payer: Self-pay | Admitting: Dermatology

## 2022-05-10 ENCOUNTER — Ambulatory Visit: Payer: Medicare Other | Admitting: Physical Therapy

## 2022-05-10 ENCOUNTER — Ambulatory Visit (INDEPENDENT_AMBULATORY_CARE_PROVIDER_SITE_OTHER): Payer: Medicare Other | Admitting: Dermatology

## 2022-05-10 VITALS — BP 101/65 | HR 66

## 2022-05-10 DIAGNOSIS — M25551 Pain in right hip: Secondary | ICD-10-CM | POA: Diagnosis not present

## 2022-05-10 DIAGNOSIS — L814 Other melanin hyperpigmentation: Secondary | ICD-10-CM

## 2022-05-10 DIAGNOSIS — D229 Melanocytic nevi, unspecified: Secondary | ICD-10-CM

## 2022-05-10 DIAGNOSIS — L821 Other seborrheic keratosis: Secondary | ICD-10-CM | POA: Diagnosis not present

## 2022-05-10 DIAGNOSIS — Z85828 Personal history of other malignant neoplasm of skin: Secondary | ICD-10-CM | POA: Diagnosis not present

## 2022-05-10 DIAGNOSIS — M25552 Pain in left hip: Secondary | ICD-10-CM

## 2022-05-10 DIAGNOSIS — L570 Actinic keratosis: Secondary | ICD-10-CM | POA: Diagnosis not present

## 2022-05-10 DIAGNOSIS — L84 Corns and callosities: Secondary | ICD-10-CM | POA: Diagnosis not present

## 2022-05-10 DIAGNOSIS — D2372 Other benign neoplasm of skin of left lower limb, including hip: Secondary | ICD-10-CM | POA: Diagnosis not present

## 2022-05-10 DIAGNOSIS — Z1283 Encounter for screening for malignant neoplasm of skin: Secondary | ICD-10-CM

## 2022-05-10 DIAGNOSIS — L578 Other skin changes due to chronic exposure to nonionizing radiation: Secondary | ICD-10-CM | POA: Diagnosis not present

## 2022-05-10 NOTE — Patient Instructions (Addendum)
Recommend taking Heliocare sun protection supplement daily in sunny weather for additional sun protection. For maximum protection on the sunniest days, you can take up to 2 capsules of regular Heliocare OR take 1 capsule of Heliocare Ultra. For prolonged exposure (such as a full day in the sun), you can repeat your dose of the supplement 4 hours after your first dose. Heliocare can be purchased at [employer], at some [facility] or at [email or web address].     Recommend Niacinamide or Nicotinamide 565m twice per day to lower risk of non-melanoma skin cancer by approximately 25%. This is usually available at [employer].   Recommend daily broad spectrum sunscreen SPF 30+ to sun-exposed areas, reapply every 2 hours as needed. Call for new or changing lesions.  Staying in the shade or wearing long sleeves, sun glasses (UVA+UVB protection) and wide brim hats (4-inch brim around the entire circumference of the hat) are also recommended for sun protection.    Melanoma ABCDEs  Melanoma is the most dangerous type of skin cancer, and is the leading cause of death from skin disease.  You are more likely to develop melanoma if you: Have light-colored skin, light-colored eyes, or red or blond hair Spend a lot of time in the sun Tan regularly, either outdoors or in a tanning bed Have had blistering sunburns, especially during childhood Have a close family member who has had a melanoma Have atypical moles or large birthmarks  Early detection of melanoma is key since treatment is typically straightforward and cure rates are extremely high if we catch it early.   The first sign of melanoma is often a change in a mole or a new dark spot.  The ABCDE system is a way of remembering the signs of melanoma.  A for asymmetry:  The two halves do not match. B for border:  The edges of the growth are irregular. C for color:  A mixture of colors are present instead of an even brown color. D for diameter:   Melanomas are usually (but not always) greater than 656m- the size of a pencil eraser. E for evolution:  The spot keeps changing in size, shape, and color.  Please check your skin once per month between visits. You can use a small mirror in front and a large mirror behind you to keep an eye on the back side or your body.   If you see any new or changing lesions before your next follow-up, please call to schedule a visit.  Please continue daily skin protection including broad spectrum sunscreen SPF 30+ to sun-exposed areas, reapplying every 2 hours as needed when you're outdoors.   Staying in the shade or wearing long sleeves, sun glasses (UVA+UVB protection) and wide brim hats (4-inch brim around the entire circumference of the hat) are also recommended for sun protection.     Due to recent changes in healthcare laws, you may see results of your pathology and/or laboratory studies on MyChart before the doctors have had a chance to review them. We understand that in some cases there may be results that are confusing or concerning to you. Please understand that not all results are received at the same time and often the doctors may need to interpret multiple results in order to provide you with the best plan of care or course of treatment. Therefore, we ask that you please give [place] business days to thoroughly review all your results before contacting the office for clarification. Should we see a  critical lab result, you will be contacted sooner.   If You Need Anything After Your Visit  If you have any questions or concerns for your doctor, please call our main line at [phone] and press option 4 to reach your doctor's medical assistant. If no one answers, please leave a voicemail as directed and we will return your call as soon as possible. Messages left after 4 pm will be answered the following business day.   You may also send [place] a message via [place]. We typically respond to MyChart messages  within 1-2 business days.  For prescription refills, please ask your pharmacy to contact our office. Our fax number is [phone].  If you have an urgent issue when the clinic is closed that cannot wait until the next business day, you can page your doctor at the number below.    Please note that while we do our best to be available for urgent issues outside of office hours, we are not available 24/7.   If you have an urgent issue and are unable to reach [place], you may choose to seek medical care at your doctor's office, retail clinic, urgent care center, or emergency room.  If you have a medical emergency, please immediately call 911 or go to the emergency department.  Pager Numbers  - Dr. Nehemiah Massed: [phone]  - Dr. Laurence Ferrari: [phone]  - Dr. Nicole Kindred: [phone]  In the event of inclement weather, please call our main line at [phone] for an update on the status of any delays or closures.  Dermatology Medication Tips: Please keep the boxes that topical medications come in in order to help keep track of the instructions about where and how to use these. Pharmacies typically print the medication instructions only on the boxes and not directly on the medication tubes.   If your medication is too expensive, please contact our office at [phone] option 4 or send [place] a message through [place].   We are unable to tell what your co-pay for medications will be in advance as this is different depending on your insurance coverage. However, we may be able to find a substitute medication at lower cost or fill out paperwork to get insurance to cover a needed medication.   If a prior authorization is required to get your medication covered by your insurance company, please allow [place] 1-2 business days to complete this process.  Drug prices often vary depending on where the prescription is filled and some pharmacies may offer cheaper prices.  The website www.goodrx.com contains coupons for  medications through different pharmacies. The prices here do not account for what the cost may be with help from insurance (it may be cheaper with your insurance), but the website can give you the price if you did not use any insurance.  - You can print the associated coupon and take it with your prescription to the pharmacy.  - You may also stop by our office during regular business hours and pick up a GoodRx coupon card.  - If you need your prescription sent electronically to a different pharmacy, notify our office through [facility] or by phone at [phone] option 4.     Si Usted Necesita Algo Despus de Su Visita  Tambin puede enviarnos un mensaje a travs de Pharmacist, community. Por lo general respondemos a los mensajes de MyChart en el transcurso de 1 a 2 das hbiles.  Para renovar recetas, por favor pida a su farmacia que se ponga en contacto con Cleotis Nipper  oficina. Harland Dingwall de fax es [place] [phone].  Si tiene un asunto urgente cuando la clnica est cerrada y que no puede esperar hasta el siguiente da hbil, puede llamar/localizar a su doctor(a) al nmero que aparece a continuacin.   Por favor, tenga en cuenta que aunque hacemos todo lo posible para estar disponibles para asuntos urgentes fuera del horario de [place], no estamos disponibles las 24 horas del da, los 7 das de la [place].   Si tiene un problema urgente y no puede comunicarse con nosotros, puede optar por buscar atencin mdica  en el consultorio de su doctor(a), en una clnica privada, en un centro de atencin urgente o en una sala de emergencias.  Si tiene Engineering geologist, por favor llame inmediatamente al 911 o vaya a la sala de emergencias.  Nmeros de bper  - Dr. Nehemiah Massed: [phone]  - Dra. Moye: [phone]  - Dra. Nicole Kindred: [phone]  En caso de inclemencias del [place], por favor llame a Johnsie Kindred principal al [phone] para una actualizacin sobre el [place] de cualquier retraso o  cierre.  Consejos para la medicacin en dermatologa: Por favor, guarde las cajas en las que vienen los medicamentos de uso tpico para ayudarle a seguir las instrucciones sobre dnde y cmo usarlos. Las farmacias generalmente imprimen las instrucciones del medicamento slo en las cajas y no directamente en los tubos del [place].   Si su medicamento es muy caro, por favor, pngase en contacto con Zigmund Daniel llamando al [phone] y presione la opcin 4 o envenos un mensaje a travs de Pharmacist, community.   No podemos decirle cul ser su copago por los medicamentos por adelantado ya que esto es diferente dependiendo de la cobertura de su seguro. Sin embargo, es posible que podamos encontrar un medicamento sustituto a Electrical engineer un formulario para que el seguro cubra el medicamento que se considera necesario.   Si se requiere una autorizacin previa para que su compaa de seguros [place] su medicamento, por favor permtanos de 1 a 2 das hbiles para completar este proceso.  Los precios de los medicamentos varan con frecuencia dependiendo del Environmental consultant de dnde se surte la receta y alguna farmacias pueden ofrecer precios ms baratos.  El sitio web www.goodrx.com tiene cupones para medicamentos de Airline pilot. Los precios aqu no tienen en cuenta lo que podra costar con la ayuda del seguro (puede ser ms barato con su seguro), pero el sitio web puede darle el precio si no utiliz Research scientist (physical sciences).  - Puede imprimir el cupn correspondiente y llevarlo con su receta a la farmacia.  - Tambin puede pasar por nuestra oficina durante el horario de atencin regular y Charity fundraiser una tarjeta de cupones de GoodRx.  - Si necesita que su receta se enve electrnicamente a una farmacia diferente, informe a nuestra oficina a travs de MyChart de [place] o por telfono llamando al [phone] y presione la opcin 4.

## 2022-05-10 NOTE — Therapy (Signed)
OUTPATIENT PHYSICAL THERAPY LOWER EXTREMITY TREATMENT/ Progress Note Reporting Period: 04/30/22   Patient Name: Brian Glass MRN: [id number] DOB:Dec 04, 1950, 72 y.o., male Today's Date: 05/11/2022  END OF SESSION:  PT End of Session - 05/10/22 1314     Visit Number 11    Number of Visits 17    Date for PT Re-Evaluation 05/25/22    Authorization - Visit Number 11    Authorization - Number of Visits 17    Progress Note Due on Visit 10    PT Start Time [id number]    PT Stop Time 1342    PT Time Calculation (min) 38 min    Activity Tolerance Patient tolerated treatment well    Behavior During Therapy WFL for tasks assessed/performed                      Past Medical History:  Diagnosis Date   Allergy    Arthritis    Atrial fibrillation ([place])    BCC (basal cell carcinoma) 11/08/2021   right medial cheek, Mohs 12/06/21   Bilateral vitreous detachment    Chickenpox    Hx of basal cell carcinoma 2018   L malar cheek   Prostate cancer ([place]) 2020   Typical atrial flutter ([place])    Past Surgical History:  Procedure Laterality Date   ATRIAL FIBRILLATION ABLATION N/A 05/10/2016   Procedure: Atrial Fibrillation Ablation;  Surgeon: [name], MD;  Location: [facility];  Service: Cardiovascular;  Laterality: N/A;   CATARACT EXTRACTION, BILATERAL  2016   COLONOSCOPY WITH PROPOFOL N/A 10/20/2018   Procedure: COLONOSCOPY WITH PROPOFOL;  Surgeon: Lin Landsman, MD;  Location: [facility];  Service: Gastroenterology;  Laterality: N/A;   EYE SURGERY     MOHS SURGERY Left 2018   malar cheek, BCC   ROBOT ASSISTED LAPAROSCOPIC RADICAL PROSTATECTOMY N/A 04/06/2019   Procedure: XI ROBOTIC ASSISTED LAPAROSCOPIC RADICAL PROSTATECTOMY;  Surgeon: Billey Co, MD;  Location: [facility]S;  Service: Urology;  Laterality: N/A;   TONSILLECTOMY  1955   Patient Active Problem List   Diagnosis Date Noted   Chronic hip pain 02/15/2022   Acute pain of right shoulder 02/15/2022    Thrombocytopenia ([place]) 11/10/2019   History of prostate cancer 04/06/2019   Encounter for screening colonoscopy 10/01/2018   Wart 11/28/2017   Medicare annual wellness visit, subsequent 03/23/2016   Paroxysmal atrial fibrillation ([place]) 11/29/2015   History of prostatectomy 11/29/2015   Hyperlipidemia 03/24/2015    PCP: Alma Friendly NP  REFERRING PROVIDER: Alma Friendly NP  REFERRING DIAG: bilat hip pain  THERAPY DIAG:  Pain in left hip  Pain in right hip  Rationale for Evaluation and Treatment: Rehabilitation  ONSET DATE: Oct 2023  SUBJECTIVE:   SUBJECTIVE STATEMENT: Pt reports he has been wearing the shoe lift for longer distances on the trail. He began to have pin-point pain directly underneath his patella on RLE so he is unsure if the shoe lift is actually helping his bilat hip pain. HEP is continuing to go well. NPS: 1/10 currently.   PERTINENT HISTORY: Pt is a 72 year old male presenting with bilat hip pain beginning with insidious onset. Pt reports anteriolateral pain bilaterally. Feels achy in nature, "more of a muscle than joint or nerve pain". Denies n/t or radicular symptoms. Noticed recently that he is tilting forward when he walks because "I think this makes my pain a little less". Pain is aggravated by walking >1hour, sitting more than 67mns and laying  on sides. Current pain 0/10; worst 6/10. Pt is retired, enjoys being active, bird watching and photography. Pt reports pain is elicited by walking >1 hour, laying on his hip. [facility] is [facility] [employer] and this does not elicit pain. Walks daily with his dog 1 mile. NO falls in past 6 months. Pt denies N/V, B&B changes, unexplained weight fluctuation, saddle paresthesia, fever, night sweats, or unrelenting night pain at this time.  R anterior hip pain L posterior hip pin    PAIN:  Are you having pain? Yes: NPRS scale: 6/10 Pain location: bilat anteriolateral hips Pain description: achy,  nagging Aggravating factors: walking >1hour, sitting more than 50mns and laying on sides Relieving factors: leaning forward, rest  PRECAUTIONS: None  WEIGHT BEARING RESTRICTIONS: No  FALLS:  Has patient fallen in last 6 months? No  LIVING ENVIRONMENT: Lives with: lives with their spouse Lives in: House/apartment Stairs: Yes: External: 1 steps; none Has following equipment at home: None  OCCUPATION: retired  PLOF: Independent  PATIENT GOALS: decrease pain  NEXT MD VISIT:   OBJECTIVE:   DIAGNOSTIC FINDINGS: Xray bilat hips negative  PATIENT SURVEYS:  FOTO 60 goal of 777 COGNITION: Overall cognitive status: Within functional limits for tasks assessed     SENSATION: WFL   MUSCLE LENGTH: Hamstrings: 25% limited bilat  POSTURE: thoracic kyphosis, FHRS, decreased lumbar lordosis  PALPATION: TTP with concordant pain to deep palpation over piriformis  LOWER EXTREMITY ROM:  Active ROM Right eval Left eval  Hip flexion WNL WNL  Hip extension WNL WNL  Hip abduction WNL WNL  Hip adduction    Hip internal rotation 25% limited 25% limited  Hip external rotation WNL WNL  Knee flexion WNL WNL  Knee extension WNL WNL  Ankle dorsiflexion    Ankle plantarflexion    Ankle inversion    Ankle eversion     (Blank rows = not tested)  LOWER EXTREMITY MMT:  MMT Right eval Left eval  Hip flexion 4- 4-  Hip extension 4- 4-  Hip abduction 4 4  Hip adduction    Hip internal rotation 5 5  Hip external rotation 5 5  Knee flexion 5 5  Knee extension 5 5  Ankle dorsiflexion    Ankle plantarflexion    Ankle inversion    Ankle eversion     (Blank rows = not tested)  Plank test 37sec 1RM leg press 65# bilat  LOWER EXTREMITY SPECIAL TESTS:  Hip special tests: PSaralyn Pilar(FABER) test: negative, Thomas test: negative, Ober's test: negative, Ely's test: negative, and Hip scouring test: negative  FUNCTIONAL TESTS:  5x STS 11sec 30sec STS test: 14 stands 10MWT self  selected: 1.181m fastest: 1.3233m6MWT 1390f51fth 6/10 NPRS  GAIT: Distance walked: 1390ft54fistive device utilized: None Level of assistance: Complete Independence Comments: antalgic compensated trendelenberg   TODAY'S TREATMENT:  DATE: 05/10/22  Walking with shoe lift 2cm on RLE 5mn 0% grade; 346m 5% grade; 23m29m0% grade 1.5mp58mith no pain other than L proximal hamstring/glute fatigue with initiation of uphill only   OMEGA L hamstring curl 20# x12 25# x14  Bilat hamstring curl on slider in bridge 2x 6 (attempted LLE only- unable)  R long sitting hip flex over 6in cone 3x 12  correction needed after first set for hip flexor fatigue with good carry over following cuing for upright posture  Alt marching on foam pad with 15# DB overhead LUE x12; RUE x12; min cuing for posture and use of core activation with good carry over *patient reporting increased L oblique effort with L overhead hold  Simultaneous R hip flex L glute max contract relax x10sec x3; mnual thomas stretch x30sc      PATIENT EDUCATION:  Education details: Patient was educated on diagnosis, anatomy and pathology involved, prognosis, role of PT, and was given an HEP, demonstrating exercise with proper form following verbal and tactile cues, and was given a paper hand out to continue exercise at home. Pt was educated on and agreed to plan of care. Person educated: Patient Education method: Explanation, Demonstration, Verbal cues, and Handouts Education comprehension: verbalized understanding, returned demonstration, and verbal cues required  HOME EXERCISE PROGRAM:  [id number]:  CLINICAL IMPRESSION: PT continued therex progression for increased bilat hip endurance/stability. PT reassessed pt progress toward goals with paitnet demonstrating increased walking distance with shoe lift,  decreased pain on NPRS; with remaining deficits in perceived function and glute max muscle endurance. PT and patient discussed decreasing the frequency of future sessions to 1x/week to increased independence toward d/c. PT educated pt on future therex geared towards bilat hip endurance and static/dynamic stabilization with success. Pt wore shoe lift throughout session. Pt demonstrating greater muscular fatigue with L standing marches > R, which coincides with iliopsoas deficit. Pt is able to comply with all demonstrations, VC, and TC for proper form and technique of therex with excellent effort throughout session. Pt with no increased pain throughout entire session. NPS: 0/10 at end of session. PT will continue progression as able. Pt would continue to benefit from skilled PT to promote optimal return to PLOF and ADLs without pain.   OBJECTIVE IMPAIRMENTS: Abnormal gait, decreased activity tolerance, decreased endurance, decreased mobility, difficulty walking, decreased ROM, decreased strength, increased fascial restrictions, impaired flexibility, improper body mechanics, and postural dysfunction.   ACTIVITY LIMITATIONS: carrying, lifting, bending, sitting, standing, squatting, and transfers  PARTICIPATION LIMITATIONS: meal prep, driving, community activity, occupation, and church  PERSONAL FACTORS: Age, Past/current experiences, Time since onset of injury/illness/exacerbation, and 1 comorbidity: Atrial fib  are also affecting patient's functional outcome.   REHAB POTENTIAL: Good  CLINICAL DECISION MAKING: Evolving/moderate complexity  EVALUATION COMPLEXITY: Moderate   GOALS: Goals reviewed with patient? Yes  SHORT TERM GOALS: Target date: 04/18/22 Pt will be independent with HEP in order to improve strength and balance in order to decrease fall risk and improve function at home and work.  Baseline: HEP given  Goal status: INITIAL   LONG TERM GOALS: Target date: 05/25/22  Patient will  increase FOTO score to 79 to demonstrate predicted increase in functional mobility to complete ADLs Baseline: IE 68; 04/30/22: 72 Goal status: ONGOING  2.  Pt will increase 6MWT by at least 86m 40mft)53forder to demonstrate clinically significant improvement in muscular endurance for  community ambulation  Baseline: 1390ft; 57f/24: 1600ft Go3ftatus: MET  3.  Pt will decrease worst pain as reported on NPRS by at least 3 points in order to demonstrate clinically significant reduction in pain. Baseline: 6/10; 04/30/22: 2/10 Goal status: MET  4.  Pt will demonstrate gross hip MMT of 4+ in order to be able to complete all household ADLs Baseline: see eval Goal status: 04/30/22: 5/5 grossly  5. Pt will be able to demonstrate at least 41 L single leg bridges to demonstrate 90% muscle endurance of RLE needed for increased standing/walking tolerance.   04/12/22: 39 04/30/22: R: 25; L: 26  ONGOING    PLAN:  PT FREQUENCY: 1-2x/week  PT DURATION: 8 weeks  PLANNED INTERVENTIONS: Therapeutic exercises, Therapeutic activity, Neuromuscular re-education, Balance training, Gait training, Patient/Family education, Self Care, Joint mobilization, Joint manipulation, Stair training, Aquatic Therapy, Dry Needling, Electrical stimulation, Spinal manipulation, Spinal mobilization, Cryotherapy, Moist heat, Traction, Ultrasound, Manual therapy, and Re-evaluation  PLAN FOR NEXT SESSION:   Durwin Reges DPT Durwin Reges, PT 05/11/2022, 9:50 AM

## 2022-05-10 NOTE — Progress Notes (Signed)
Follow-Up Visit   Subjective  Brian Glass is a 72 y.o. male who presents for the following: Annual Exam (HxBCC, HxAKs ).  The patient presents for Total-Body Skin Exam (TBSE) for skin cancer screening and mole check.  The patient has spots, moles and lesions to be evaluated, some may be new or changing and the patient has concerns that these could be cancer.   The following portions of the chart were reviewed this encounter and updated as appropriate:  Tobacco  Allergies  Meds  Problems  Med Hx  Surg Hx  Fam Hx      Review of Systems: No other skin or systemic complaints except as noted in HPI or Assessment and Plan.   Objective  Well appearing patient in no apparent distress; mood and affect are within normal limits.  A full examination was performed including scalp, head, eyes, ears, nose, lips, neck, chest, axillae, abdomen, back, buttocks, bilateral upper extremities, bilateral lower extremities, hands, feet, fingers, toes, fingernails, and toenails. All findings within normal limits unless otherwise noted below.  Left Hallux Interphalangeal Joint of Toe Thickened skin  Right Dorsal Hand x1 Erythematous thin papules/macules with gritty scale.    Assessment & Plan   History of Basal Cell Carcinoma of the Skin. Left malar cheek 2018. Right medial cheek, Mohs 12/06/2021. - No evidence of recurrence today - Recommend regular full body skin exams - Recommend daily broad spectrum sunscreen SPF 30+ to sun-exposed areas, reapply every 2 hours as needed.  - Call if any new or changing lesions are noted between office visits   Lentigines - Scattered tan macules - Due to sun exposure - Benign-appearing, observe - Recommend daily broad spectrum sunscreen SPF 30+ to sun-exposed areas, reapply every 2 hours as needed. - Call for any changes  Seborrheic Keratoses - Stuck-on, waxy, tan-brown papules and/or plaques  - Benign-appearing - Discussed benign etiology and  prognosis. - Observe - Call for any changes  Melanocytic Nevi - Tan-brown and/or pink-flesh-colored symmetric macules and papules - Benign appearing on exam today - Observation - Call clinic for new or changing moles - Recommend daily use of broad spectrum spf 30+ sunscreen to sun-exposed areas.   Hemangiomas - Red papules - Discussed benign nature - Observe - Call for any changes  Actinic Damage - Chronic condition, secondary to cumulative UV/sun exposure - diffuse scaly erythematous macules with underlying dyspigmentation - Recommend daily broad spectrum sunscreen SPF 30+ to sun-exposed areas, reapply every 2 hours as needed.  - Staying in the shade or wearing long sleeves, sun glasses (UVA+UVB protection) and wide brim hats (4-inch brim around the entire circumference of the hat) are also recommended for sun protection.  - Call for new or changing lesions.  Skin cancer screening performed today.  Dermatofibroma. Left thigh x2. - Firm pink/brown papulenodule with dimple sign - Benign appearing - Call for any changes   Callus of toe Left Hallux Interphalangeal Joint of Toe  Recommend using Aquaphor ointment few times daily to help soften. Avoid shoes that rub.  AK (actinic keratosis) Right Dorsal Hand x1  Actinic keratoses are precancerous spots that appear secondary to cumulative UV radiation exposure/sun exposure over time. They are chronic with expected duration over 1 year. A portion of actinic keratoses will progress to squamous cell carcinoma of the skin. It is not possible to reliably predict which spots will progress to skin cancer and so treatment is recommended to prevent development of skin cancer.  Recommend daily broad spectrum sunscreen  SPF 30+ to sun-exposed areas, reapply every 2 hours as needed.  Recommend staying in the shade or wearing long sleeves, sun glasses (UVA+UVB protection) and wide brim hats (4-inch brim around the entire circumference of the  hat). Call for new or changing lesions.  Destruction of lesion - Right Dorsal Hand x1  Destruction method: cryotherapy   Informed consent: discussed and consent obtained   Lesion destroyed using liquid nitrogen: Yes   Region frozen until ice ball extended beyond lesion: Yes   Outcome: patient tolerated procedure well with no complications   Post-procedure details: wound care instructions given   Additional details:  Prior to procedure, discussed risks of blister formation, small wound, skin dyspigmentation, or rare scar following cryotherapy. Recommend Vaseline ointment to treated areas while healing.    Return in about 6 months (around 11/08/2022) for TBSE, HxBCCs.  I, Emelia Salisbury, CMA, am acting as scribe for Forest Gleason, MD.   Documentation: I have reviewed the above documentation for accuracy and completeness, and I agree with the above.  Forest Gleason, MD

## 2022-05-12 ENCOUNTER — Encounter: Payer: Self-pay | Admitting: Dermatology

## 2022-05-14 ENCOUNTER — Ambulatory Visit: Payer: Medicare Other | Admitting: Physical Therapy

## 2022-05-14 ENCOUNTER — Encounter: Payer: Self-pay | Admitting: Physical Therapy

## 2022-05-14 DIAGNOSIS — M25552 Pain in left hip: Secondary | ICD-10-CM

## 2022-05-14 DIAGNOSIS — M25551 Pain in right hip: Secondary | ICD-10-CM

## 2022-05-14 NOTE — Therapy (Signed)
OUTPATIENT PHYSICAL THERAPY LOWER EXTREMITY TREATMENT/ Progress Note Reporting Period: 04/30/22   Patient Name: Brian Glass MRN: [id number] DOB:08/04/1950, 72 y.o., male Today's Date: 05/14/2022  END OF SESSION:  PT End of Session - 05/14/22 1351     Visit Number 12    Number of Visits 17    Date for PT Re-Evaluation 05/25/22    Authorization - Visit Number 12    Authorization - Number of Visits 17    Progress Note Due on Visit 10    PT Start Time [id number]    PT Stop Time 1419    PT Time Calculation (min) 34 min    Activity Tolerance Patient tolerated treatment well    Behavior During Therapy [facility] for tasks assessed/performed                       Past Medical History:  Diagnosis Date   Allergy    Arthritis    Atrial fibrillation ([place])    BCC (basal cell carcinoma) 11/08/2021   right medial cheek, Mohs 12/06/21   Bilateral vitreous detachment    Chickenpox    Hx of basal cell carcinoma 2018   L malar cheek   Prostate cancer ([place]) 2020   Typical atrial flutter ([place])    Past Surgical History:  Procedure Laterality Date   ATRIAL FIBRILLATION ABLATION N/A 05/10/2016   Procedure: Atrial Fibrillation Ablation;  Surgeon: [name], MD;  Location: [facility];  Service: Cardiovascular;  Laterality: N/A;   CATARACT EXTRACTION, BILATERAL  2016   COLONOSCOPY WITH PROPOFOL N/A 10/20/2018   Procedure: COLONOSCOPY WITH PROPOFOL;  Surgeon: Lin Landsman, MD;  Location: [facility];  Service: Gastroenterology;  Laterality: N/A;   EYE SURGERY     MOHS SURGERY Left 2018   malar cheek, BCC   ROBOT ASSISTED LAPAROSCOPIC RADICAL PROSTATECTOMY N/A 04/06/2019   Procedure: XI ROBOTIC ASSISTED LAPAROSCOPIC RADICAL PROSTATECTOMY;  Surgeon: Billey Co, MD;  Location: [facility];  Service: Urology;  Laterality: N/A;   TONSILLECTOMY  1955   Patient Active Problem List   Diagnosis Date Noted   Chronic hip pain 02/15/2022   Acute pain of right shoulder 02/15/2022    Thrombocytopenia ([place]) 11/10/2019   History of prostate cancer 04/06/2019   Encounter for screening colonoscopy 10/01/2018   Wart 11/28/2017   Medicare annual wellness visit, subsequent 03/23/2016   Paroxysmal atrial fibrillation ([place]) 11/29/2015   History of prostatectomy 11/29/2015   Hyperlipidemia 03/24/2015    PCP: Alma Friendly NP  REFERRING PROVIDER: Alma Friendly NP  REFERRING DIAG: bilat hip pain  THERAPY DIAG:  Pain in left hip  Pain in right hip  Rationale for Evaluation and Treatment: Rehabilitation  ONSET DATE: Oct 2023  SUBJECTIVE:   SUBJECTIVE STATEMENT: Pt reports doing decently well overall. Was sore following walking yesterday, but did not ear brace.   PERTINENT HISTORY: Pt is a 72 year old male presenting with bilat hip pain beginning with insidious onset. Pt reports anteriolateral pain bilaterally. Feels achy in nature, "more of a muscle than joint or nerve pain". Denies n/t or radicular symptoms. Noticed recently that he is tilting forward when he walks because "I think this makes my pain a little less". Pain is aggravated by walking >1hour, sitting more than 62mns and laying on sides. Current pain 0/10; worst 6/10. Pt is retired, enjoys being active, bird watching and photography. Pt reports pain is elicited by walking >1 hour, laying on his hip. Pt is active [facility] [employer] and this does not elicit pain. Walks daily with his dog 1 mile. NO falls in past 6 months. Pt denies N/V, B&B changes, unexplained weight fluctuation, saddle paresthesia, fever, night sweats, or unrelenting night pain at this time.  R anterior hip pain L posterior hip pin    PAIN:  Are you having pain? Yes: NPRS scale: 6/10 Pain location: bilat anteriolateral hips Pain description: achy, nagging Aggravating factors: walking >1hour, sitting more than 26mns and laying on sides Relieving factors: leaning forward, rest  PRECAUTIONS: None  WEIGHT BEARING  RESTRICTIONS: No  FALLS:  Has patient fallen in last 6 months? No  LIVING ENVIRONMENT: Lives with: lives with their spouse Lives in: House/apartment Stairs: Yes: External: 1 steps; none Has following equipment at home: None  OCCUPATION: retired  PLOF: Independent  PATIENT GOALS: decrease pain  NEXT MD VISIT:   OBJECTIVE:   DIAGNOSTIC FINDINGS: Xray bilat hips negative  PATIENT SURVEYS:  FOTO 60 goal of 71 COGNITION: Overall cognitive status: Within functional limits for tasks assessed     SENSATION: WFL   MUSCLE LENGTH: Hamstrings: 25% limited bilat  POSTURE: thoracic kyphosis, FHRS, decreased lumbar lordosis  PALPATION: TTP with concordant pain to deep palpation over piriformis  LOWER EXTREMITY ROM:  Active ROM Right eval Left eval  Hip flexion WNL WNL  Hip extension WNL WNL  Hip abduction WNL WNL  Hip adduction    Hip internal rotation 25% limited 25% limited  Hip external rotation WNL WNL  Knee flexion WNL WNL  Knee extension WNL WNL  Ankle dorsiflexion    Ankle plantarflexion    Ankle inversion    Ankle eversion     (Blank rows = not tested)  LOWER EXTREMITY MMT:  MMT Right eval Left eval  Hip flexion 4- 4-  Hip extension 4- 4-  Hip abduction 4 4  Hip adduction    Hip internal rotation 5 5  Hip external rotation 5 5  Knee flexion 5 5  Knee extension 5 5  Ankle dorsiflexion    Ankle plantarflexion    Ankle inversion    Ankle eversion     (Blank rows = not tested)  Plank test 37sec 1RM leg press 65# bilat  LOWER EXTREMITY SPECIAL TESTS:  Hip special tests: PSaralyn Pilar(FABER) test: negative, Thomas test: negative, Ober's test: negative, Ely's test: negative, and Hip scouring test: negative  FUNCTIONAL TESTS:  5x STS 11sec 30sec STS test: 14 stands 10MWT self selected: 1.167m fastest: 1.3264m6MWT [id number] 6/10 NPRS  GAIT: Distance walked: 1390ft35fistive device utilized: None Level of assistance: Complete  Independence Comments: antalgic compensated trendelenberg   TODAY'S TREATMENT:                                                                                                                              DATE: 05/10/22  Walking with shoe lift 2cm on RLE 2min 71mgrade; 3min 524mrade;  5mn 0% grade 1.589m with no pain other than L proximal hamstring/glute fatigue with initiation of uphill only   PT reviewed the following HEP with patient with patient able to demonstrate a set of the following with min cuing for correction needed. PT educated patient on parameters of therex (how/when to inc/decrease intensity, frequency, rep/set range, stretch hold time, and purpose of therex) with verbalized understanding.  Access Code: [email or web address] Single Leg Bridge  - 1-2 x weekly - 3 sets - 12-20 reps - Standing Hip Hiking  - 1-2 x weekly - 3 sets - 12-20 reps - Straight Leg Raise with Arm Support  - 1 x daily - 1-2 x weekly - 3 sets - 12-20 reps - Single Leg Sit to Stand with Arms Extended  - 1-2 x weekly - 3 sets - 6-12 reps - Standing Hip Hinge  - 1-2 x weekly - 3 sets - 6-12 reps - Hip Flexor Stretch at Edge of Bed  - 1-3 x daily - 5 x weekly - 30-60sec hold - Seated Piriformis Stretch  - 1-3 x daily - 5 x weekly - 30-60sec hold - Seated Glute Stretch  - 1-3 x daily - 5 x weekly - 30-60sec hold    PATIENT EDUCATION:  Education details: Patient was educated on diagnosis, anatomy and pathology involved, prognosis, role of PT, and was given an HEP, demonstrating exercise with proper form following verbal and tactile cues, and was given a paper hand out to continue exercise at home. Pt was educated on and agreed to plan of care. Person educated: Patient Education method: Explanation, Demonstration, Verbal cues, and Handouts Education comprehension: verbalized understanding, returned demonstration, and verbal cues required  HOME EXERCISE PROGRAM:  LDMP:[id number]:  CLINICAL IMPRESSION: PT  reassessed goals this session where patient has met all goals to safely d/c formal PT. Patient is able to demonstrate and verbalize understanding of all HEP recommendations with minimal corrections needed. Pt given clinic contact info should further questions or concerns arise. Pt to d/c PT.     OBJECTIVE IMPAIRMENTS: Abnormal gait, decreased activity tolerance, decreased endurance, decreased mobility, difficulty walking, decreased ROM, decreased strength, increased fascial restrictions, impaired flexibility, improper body mechanics, and postural dysfunction.   ACTIVITY LIMITATIONS: carrying, lifting, bending, sitting, standing, squatting, and transfers  PARTICIPATION LIMITATIONS: meal prep, driving, community activity, occupation, and church  PERSONAL FACTORS: Age, Past/current experiences, Time since onset of injury/illness/exacerbation, and 1 comorbidity: Atrial fib  are also affecting patient's functional outcome.   REHAB POTENTIAL: Good  CLINICAL DECISION MAKING: Evolving/moderate complexity  EVALUATION COMPLEXITY: Moderate   GOALS: Goals reviewed with patient? Yes  SHORT TERM GOALS: Target date: 04/18/22 Pt will be independent with HEP in order to improve strength and balance in order to decrease fall risk and improve function at home and work.  Baseline: HEP given  Goal status: INITIAL   LONG TERM GOALS: Target date: 05/25/22  Patient will increase FOTO score to 79 to demonstrate predicted increase in functional mobility to complete ADLs Baseline: IE 68; 04/30/22: 72; 05/14/22 74 Goal status: ONGOING  2.  Pt will increase 6MWT by at least [id number] order to demonstrate clinically significant improvement in muscular endurance for  community ambulation  Baseline: 1390ft24f21/24: 1600ft 7f status: MET  3.  Pt will decrease worst pain as reported on NPRS by at least 3 points in order to demonstrate clinically significant reduction in pain. Baseline: 6/10; 04/30/22:  2/10 Goal status: MET  4.  Pt will  demonstrate gross hip MMT of 4+ in order to be able to complete all household ADLs Baseline: see eval Goal status: 04/30/22: 5/5 grossly  5. Pt will be able to demonstrate at least 41 L single leg bridges to demonstrate 90% muscle endurance of RLE needed for increased standing/walking tolerance.   04/12/22: 39 04/30/22: R: 25; L: 26 ; 05/14/22 41 bilat MET    PLAN:  PT FREQUENCY: 1-2x/week  PT DURATION: 8 weeks  PLANNED INTERVENTIONS: Therapeutic exercises, Therapeutic activity, Neuromuscular re-education, Balance training, Gait training, Patient/Family education, Self Care, Joint mobilization, Joint manipulation, Stair training, Aquatic Therapy, Dry Needling, Electrical stimulation, Spinal manipulation, Spinal mobilization, Cryotherapy, Moist heat, Traction, Ultrasound, Manual therapy, and Re-evaluation  PLAN FOR NEXT SESSION:   Durwin Reges DPT Durwin Reges, PT 05/14/2022, 2:20 PM

## 2022-05-17 ENCOUNTER — Encounter: Payer: Medicare Other | Admitting: Physical Therapy

## 2022-05-24 ENCOUNTER — Ambulatory Visit (INDEPENDENT_AMBULATORY_CARE_PROVIDER_SITE_OTHER): Payer: Medicare Other

## 2022-05-24 ENCOUNTER — Other Ambulatory Visit: Payer: Self-pay | Admitting: Primary Care

## 2022-05-24 VITALS — Ht 70.0 in | Wt 164.0 lb

## 2022-05-24 DIAGNOSIS — Z0000 Encounter for general adult medical examination without abnormal findings: Secondary | ICD-10-CM

## 2022-05-24 DIAGNOSIS — E785 Hyperlipidemia, unspecified: Secondary | ICD-10-CM

## 2022-05-24 NOTE — Patient Instructions (Signed)
[name] , Thank you for taking time to come for your Medicare Wellness Visit. I appreciate your ongoing commitment to your health goals. Please review the following plan we discussed and let me know if I can assist you in the future.   These are the goals we discussed:  Goals      Patient Stated     Would like to drink more water      Patient Stated     Stay healthy.        This is a list of the screening recommended for you and due dates:  Health Maintenance  Topic Date Due   Pneumonia Vaccine (3 of 3 - PPSV23 or PCV20) 03/23/2021   COVID-19 Vaccine (6 - 2023-24 season) 02/13/2022   Medicare Annual Wellness Visit  05/24/2023   Colon Cancer Screening  10/19/2025   DTaP/Tdap/Td vaccine (5 - Td or Tdap) 05/16/2032   Flu Shot  Completed   Hepatitis C Screening: USPSTF Recommendation to screen - Ages 18-79 yo.  Completed   Zoster (Shingles) Vaccine  Completed   HPV Vaccine  Aged Out    Advanced directives: copy is in chart.  Conditions/risks identified: none  Next appointment: Follow up in one year for your annual wellness visit. 05/29/2023 @ 2:45 via telephone.  Preventive Care 72 Years and Older, Male  Preventive care refers to lifestyle choices and visits with your health care provider that can promote health and wellness. What does preventive care include? A yearly physical exam. This is also called an annual well check. Dental exams once or twice a year. Routine eye exams. Ask your health care provider how often you should have your eyes checked. Personal lifestyle choices, including: Daily care of your teeth and gums. Regular physical activity. Eating a healthy diet. Avoiding tobacco and drug use. Limiting alcohol use. Practicing safe sex. Taking low doses of aspirin every day. Taking vitamin and mineral supplements as recommended by your health care provider. What happens during an annual well check? The services and screenings done by your health care  provider during your annual well check will depend on your age, overall health, lifestyle risk factors, and family history of disease. Counseling  Your health care provider may ask you questions about your: Alcohol use. Tobacco use. Drug use. Emotional well-being. Home and relationship well-being. Sexual activity. Eating habits. History of falls. Memory and ability to understand (cognition). Work and work Statistician. Screening  You may have the following tests or measurements: Height, weight, and BMI. Blood pressure. Lipid and cholesterol levels. These may be checked every 5 years, or more frequently if you are over 50 years old. Skin check. Lung cancer screening. You may have this screening every year starting at age 6 if you have a 30-pack-year history of smoking and currently smoke or have quit within the past 15 years. Fecal occult blood test (FOBT) of the stool. You may have this test every year starting at age 34. Flexible sigmoidoscopy or colonoscopy. You may have a sigmoidoscopy every 5 years or a colonoscopy every 10 years starting at age 21. Prostate cancer screening. Recommendations will vary depending on your family history and other risks. Hepatitis C blood test. Hepatitis B blood test. Sexually transmitted disease (STD) testing. Diabetes screening. This is done by checking your blood sugar (glucose) after you have not eaten for a while (fasting). You may have this done every 1-3 years. Abdominal aortic aneurysm (AAA) screening. You may need this if you are a current or  former smoker. Osteoporosis. You may be screened starting at age 66 if you are at high risk. Talk with your health care provider about your test results, treatment options, and if necessary, the need for more tests. Vaccines  Your health care provider may recommend certain vaccines, such as: Influenza vaccine. This is recommended every year. Tetanus, diphtheria, and acellular pertussis (Tdap, Td)  vaccine. You may need a Td booster every 10 years. Zoster vaccine. You may need this after age 72. Pneumococcal 13-valent conjugate (PCV13) vaccine. One dose is recommended after age 72. Pneumococcal polysaccharide (PPSV23) vaccine. One dose is recommended after age 72. Talk to your health care provider about which screenings and vaccines you need and how often you need them. This information is not intended to replace advice given to you by your health care provider. Make sure you discuss any questions you have with your health care provider. Document Released: 04/01/2015 Document Revised: 11/23/2015 Document Reviewed: 01/04/2015 Elsevier Interactive Patient Education  2017 [place] Prevention in the Home Falls can cause injuries. They can happen to people of all ages. There are many things you can do to make your home safe and to help prevent falls. What can I do on the outside of my home? Regularly fix the edges of walkways and driveways and fix any cracks. Remove anything that might make you trip as you walk through a door, such as a raised step or threshold. Trim any bushes or trees on the path to your home. Use bright outdoor lighting. Clear any walking paths of anything that might make someone trip, such as rocks or tools. Regularly check to see if handrails are loose or broken. Make sure that both sides of any steps have handrails. Any raised decks and porches should have guardrails on the edges. Have any leaves, snow, or ice cleared regularly. Use sand or salt on walking paths during winter. Clean up any spills in your garage right away. This includes oil or grease spills. What can I do in the bathroom? Use night lights. Install grab bars by the toilet and in the tub and shower. Do not use towel bars as grab bars. Use non-skid mats or decals in the tub or shower. If you need to sit down in the shower, use a plastic, non-slip stool. Keep the floor dry. Clean up any  water that spills on the floor as soon as it happens. Remove soap buildup in the tub or shower regularly. Attach bath mats securely with double-sided non-slip rug tape. Do not have throw rugs and other things on the floor that can make you trip. What can I do in the bedroom? Use night lights. Make sure that you have a light by your bed that is easy to reach. Do not use any sheets or blankets that are too big for your bed. They should not hang down onto the floor. Have a firm chair that has side arms. You can use this for support while you get dressed. Do not have throw rugs and other things on the floor that can make you trip. What can I do in the kitchen? Clean up any spills right away. Avoid walking on wet floors. Keep items that you use a lot in easy-to-reach places. If you need to reach something above you, use a strong step stool that has a grab bar. Keep electrical cords out of the way. Do not use floor polish or wax that makes floors slippery. If you must use wax, use  non-skid floor wax. Do not have throw rugs and other things on the floor that can make you trip. What can I do with my stairs? Do not leave any items on the stairs. Make sure that there are handrails on both sides of the stairs and use them. Fix handrails that are broken or loose. Make sure that handrails are as long as the stairways. Check any carpeting to make sure that it is firmly attached to the stairs. Fix any carpet that is loose or worn. Avoid having throw rugs at the top or bottom of the stairs. If you do have throw rugs, attach them to the floor with carpet tape. Make sure that you have a light switch at the top of the stairs and the bottom of the stairs. If you do not have them, ask someone to add them for you. What else can I do to help prevent falls? Wear shoes that: Do not have high heels. Have rubber bottoms. Are comfortable and fit you well. Are closed at the toe. Do not wear sandals. If you use a  stepladder: Make sure that it is fully opened. Do not climb a closed stepladder. Make sure that both sides of the stepladder are locked into place. Ask someone to hold it for you, if possible. Clearly mark and make sure that you can see: Any grab bars or handrails. First and last steps. Where the edge of each step is. Use tools that help you move around (mobility aids) if they are needed. These include: Canes. Walkers. Scooters. Crutches. Turn on the lights when you go into a dark area. Replace any light bulbs as soon as they burn out. Set up your furniture so you have a clear path. Avoid moving your furniture around. If any of your floors are uneven, fix them. If there are any pets around you, be aware of where they are. Review your medicines with your doctor. Some medicines can make you feel dizzy. This can increase your chance of falling. Ask your doctor what other things that you can do to help prevent falls. This information is not intended to replace advice given to you by your health care provider. Make sure you discuss any questions you have with your health care provider. Document Released: 12/30/2008 Document Revised: 08/11/2015 Document Reviewed: 04/09/2014 Elsevier Interactive Patient Education  2017 [employer].

## 2022-05-24 NOTE — Telephone Encounter (Signed)
Patient has been scheduled

## 2022-05-24 NOTE — Progress Notes (Signed)
I connected with  [name] on 05/24/22 by a audio enabled telemedicine application and verified that I am speaking with the correct person using two identifiers.  Patient Location: Home  Provider Location: Home Office  I discussed the limitations of evaluation and management by telemedicine. The patient expressed understanding and agreed to proceed.  Subjective:   Brian Glass is a 72 y.o. male who presents for Medicare Annual/Subsequent preventive examination.  Review of Systems      Cardiac Risk Factors include: advanced age (>76mn, >>6women);male gender     Objective:    Today's Vitals   05/24/22 1046 05/24/22 1050  Weight: 164 lb (74.4 kg)   Height: '5\' 10"'$  (1.778 m)   PainSc:  1    Body mass index is 23.53 kg/m.     05/24/2022   10:59 AM 03/22/2022    1:12 PM 05/22/2021   11:58 AM 01/18/2021   11:13 AM 04/06/2019    6:15 AM 03/31/2019    8:57 AM 03/30/2019    1:08 PM  Advanced Directives  Does Patient Have a Medical Advance Directive? Yes Yes Yes Yes Yes Yes Yes  Type of [place]aramedicof [place] will  [place] will  Healthcare Power of [place]  Does patient want to make changes to medical advance directive? No - Patient declined No - Patient declined Yes (MAU/Ambulatory/Procedural Areas - Information given)      Copy of [place] Chart? Yes - validated most recent copy scanned in chart (See row information)    No - copy requested No - copy requested     Current Medications (verified) Outpatient Encounter Medications as of 05/24/2022  Medication Sig   Calcium Polycarbophil (EQ FIBER LAXATIVE PO) Take 1 capsule by mouth daily.    fluticasone (FLONASE) 50 MCG/ACT nasal spray Place 1 spray into both nostrils daily as needed for allergies.    Multiple Vitamins-Minerals (SENIOR MULTIVITAMIN PLUS PO) Take 1 tablet by mouth daily.   Omega-3 Fatty Acids (FISH OIL PO)  Take 1,300 mg by mouth daily.   rosuvastatin (CRESTOR) 5 MG tablet TAKE 1 TABLET EVERY EVENING FOR CHOLESTEROL   olopatadine (PATANOL) 0.1 % ophthalmic solution Place 1 drop into both eyes 2 (two) times daily as needed for allergies.  (Patient not taking: Reported on 03/20/2022)   No facility-administered encounter medications on file as of 05/24/2022.    Allergies (verified) Patient has no known allergies.   History: Past Medical History:  Diagnosis Date   Allergy    Arthritis    Atrial fibrillation ([place]    BCC (basal cell carcinoma) 11/08/2021   right medial cheek, Mohs 12/06/21   Bilateral vitreous detachment    Chickenpox    Hx of basal cell carcinoma 2018   L malar cheek   Prostate cancer ([place] 2020   Typical atrial flutter ([place]    Past Surgical History:  Procedure Laterality Date   ATRIAL FIBRILLATION ABLATION N/A 05/10/2016   Procedure: Atrial Fibrillation Ablation;  Surgeon: JThompson Grayer MD;  [facility];  Service: Cardiovascular;  Laterality: N/A;   CATARACT EXTRACTION, BILATERAL  2016   COLONOSCOPY WITH PROPOFOL N/A 10/20/2018   Procedure: COLONOSCOPY WITH PROPOFOL;  Surgeon: VLin Landsman MD;  [facility];  Service: Gastroenterology;  Laterality: N/A;   EYE SURGERY     MOHS SURGERY Left 2018   malar cheek, BCC   MOHS SURGERY Right  cheek 2023   ROBOT ASSISTED LAPAROSCOPIC RADICAL PROSTATECTOMY N/A 04/06/2019   Procedure: XI ROBOTIC ASSISTED LAPAROSCOPIC RADICAL PROSTATECTOMY;  Surgeon: Billey Co, MD;  Location: [facility];  Service: Urology;  Laterality: N/A;   TONSILLECTOMY  1955   Family History  Problem Relation Age of Onset   Hyperlipidemia Mother    Heart disease Mother    Hypertension Mother    Hyperlipidemia Father    Heart disease Father    Stroke Father    Hypertension Father    Heart attack Father    Prostate cancer Brother    Atrial fibrillation Brother    Sleep apnea Brother    CAD Brother     Social History   Socioeconomic History   Marital status: Married    Spouse name: Dorian Pod   Number of children: Not on file   Years of education: Not on file   Highest education level: Not on file  Occupational History   Not on file  Tobacco Use   Smoking status: Never   Smokeless tobacco: Former    Types: Chew    Quit date: 1970  Vaping Use   Vaping Use: Never used  Substance and Sexual Activity   Alcohol use: Yes    Alcohol/week: 2.0 standard drinks of alcohol    Types: 2 Cans of beer per week    Comment: social   Drug use: No   Sexual activity: Yes    Birth control/protection: None  Other Topics Concern   Not on file  Social History Narrative   Married.   3 children. 2 grandchildren.   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   Social Determinants of Health   Financial Resource Strain: Low Risk  (05/24/2022)   Overall Financial Resource Strain (CARDIA)    Difficulty of Paying Living Expenses: Not hard at all  Food Insecurity: No Food Insecurity (05/24/2022)   Hunger Vital Sign    Worried About Running Out of Food in the Last Year: Never true    Ran Out of Food in the Last Year: Never true  Transportation Needs: No Transportation Needs (05/24/2022)   PRAPARE - Hydrologist (Medical): No    Lack of Transportation (Non-Medical): No  Physical Activity: Sufficiently Active (05/24/2022)   Exercise Vital Sign    Days of Exercise per Week: 7 days    Minutes of Exercise per Session: 60 min  Stress: No Stress Concern Present (05/24/2022)   [place]    Feeling of Stress : Not at all  Social Connections: Moderately Integrated (05/24/2022)   Social Connection and Isolation Panel [NHANES]    Frequency of Communication with Friends and Family: More than three times a week    Frequency of Social Gatherings with Friends and Family: Three times a week    Attends Religious  Services: Never    Active Member of Clubs or Organizations: Yes    Attends Music therapist: More than 4 times per year    Marital Status: Married    Tobacco Counseling Counseling given: Not Answered   Clinical Intake:  Pre-visit preparation completed: Yes  Pain : 0-10 Pain Score: 1  Pain Type: Acute pain Pain Location: Back Pain Orientation: Lower Pain Descriptors / Indicators: Pressure Pain Onset: In the past 7 days (pain has been improving)     Nutritional Risks: None Diabetes: No  How often do you need to have  someone help you when you read instructions, pamphlets, or other written materials from your doctor or pharmacy?: 1 - Never  Diabetic? no  Interpreter Needed?: No  Information entered by :: C.Ivey Cina LPN   Activities of Daily Living    05/24/2022   10:59 AM 05/20/2022   11:04 AM  In your present state of health, do you have any difficulty performing the following activities:  Hearing? 0 0  Vision? 0 0  Difficulty concentrating or making decisions? 0 0  Walking or climbing stairs? 0 0  Dressing or bathing? 0 0  Doing errands, shopping? 0 0  Preparing Food and eating ? N N  Using the Toilet? N N  In the past six months, have you accidently leaked urine? Y Y  Comment Under urologist care   Do you have problems with loss of bowel control? N N  Managing your Medications? N N  Managing your Finances? N N  Housekeeping or managing your Housekeeping? N N    Patient Care Team: Pleas Koch, NP as PCP - General (Internal Medicine)  Indicate any recent Medical Services you may have received from other than [facility] providers in the past year (date may be approximate).     Assessment:   This is a routine wellness examination for Tip.  Hearing/Vision screen Hearing Screening - Comments:: Wears aides Vision Screening - Comments:: Readers - [facility]  Dietary issues and exercise activities discussed: Current Exercise Habits:  Structured exercise class, Type of exercise: calisthenics;walking, Time (Minutes): 60, Frequency (Times/Week): 7, Weekly Exercise (Minutes/Week): 420, Intensity: Moderate, Exercise limited by: None identified   Goals Addressed             This Visit's Progress    Patient Stated       Stay healthy.       Depression Screen    05/24/2022   10:58 AM 05/22/2021   12:00 PM 03/22/2021    2:44 PM 10/01/2018    2:55 PM 07/11/2017   10:30 AM 03/23/2016   10:31 AM  PHQ 2/9 Scores  PHQ - 2 Score 0 0 0 0 0 0  PHQ- 9 Score   0       Fall Risk    05/24/2022   10:53 AM 05/20/2022   11:04 AM 05/22/2021   11:59 AM 03/22/2021    2:44 PM 11/10/2019    2:30 PM  [place] in the past year? 0 0 0 0 1  Number falls in past yr: 0 0 0 0 0  Injury with Fall? 0 0 0 0 0  Risk for fall due to : No Fall Risks  No Fall Risks    Follow up Falls prevention discussed;Falls evaluation completed  Falls prevention discussed      FALL RISK PREVENTION PERTAINING TO THE HOME:  Any stairs in or around the home? Yes  If so, are there any without handrails? Yes  Home free of loose throw rugs in walkways, pet beds, electrical cords, etc? Yes  Adequate lighting in your home to reduce risk of falls? No   ASSISTIVE DEVICES UTILIZED TO PREVENT FALLS:  Life alert? No  Use of a cane, walker or w/c? No  Grab bars in the bathroom? Yes  Shower chair or bench in shower? No  Elevated toilet seat or a handicapped toilet? Yes    Cognitive Function:        05/24/2022   11:00 AM  6CIT Screen  What Year? 0 points  What month? 0 points  What time? 0 points  Count back from 20 0 points  Months in reverse 0 points  Repeat phrase 0 points  Total Score 0 points    Immunizations Immunization History  Administered Date(s) Administered   Fluad Quad(high Dose 65+) 12/31/2020   H1N1 03/15/2008   Hep A / Hep B 04/12/2022   Influenza Split 12/25/2006, 01/03/2010, 12/19/2010   Influenza, High Dose Seasonal PF  12/25/2018, 12/17/2019   Influenza,inj,Quad PF,6+ Mos 12/16/2015, 12/07/2016, 12/11/2017   Influenza-Unspecified 01/07/2015, 12/17/2019, 12/19/2021   PFIZER(Purple Top)SARS-COV-2 Vaccination 05/04/2019, 05/25/2019, 01/12/2020, 12/19/2021   Pfizer Covid-19 Vaccine Bivalent Booster 27yr & up 01/20/2021   Pneumococcal Conjugate-13 03/23/2016   Pneumococcal Polysaccharide-23 06/08/2011   Respiratory Syncytial Virus Vaccine,Recomb Aduvanted(Arexvy) 02/21/2022   Td 04/23/2008, 05/17/2022   Tdap 06/08/2011, 09/13/2012   Typhoid Inactivated 04/25/2022   Zoster Recombinat (Shingrix) 10/04/2020, 01/20/2021   Zoster, Live 12/23/2007, 06/08/2011, 09/13/2012    TDAP status: Up to date  Flu Vaccine status: Up to date  Pneumococcal vaccine status: Due, Education has been provided regarding the importance of this vaccine. Advised may receive this vaccine at local pharmacy or Health Dept. Aware to provide a copy of the vaccination record if obtained from local pharmacy or Health Dept. Verbalized acceptance and understanding.  Covid-19 vaccine status: Information provided on how to obtain vaccines.   Qualifies for Shingles Vaccine? Yes   Zostavax completed  unknown   Shingrix Completed?: Yes  Screening Tests Health Maintenance  Topic Date Due   Pneumonia Vaccine 72 Years old (3 of 3 - PPSV23 or PCV20) 03/23/2021   COVID-19 Vaccine (6 - 2023-24 season) 02/13/2022   Medicare Annual Wellness (AWV)  05/24/2023   COLONOSCOPY (Pts 45-447yrInsurance coverage will need to be confirmed)  10/19/2025   DTaP/Tdap/Td (5 - Td or Tdap) 05/16/2032   INFLUENZA VACCINE  Completed   Hepatitis C Screening  Completed   Zoster Vaccines- Shingrix  Completed   HPV VACCINES  Aged Out    Health Maintenance  Health Maintenance Due  Topic Date Due   Pneumonia Vaccine 6569Years old (3 47f 3 - PPSV23 or PCV20) 03/23/2021   COVID-19 Vaccine (6 - 2023-24 season) 02/13/2022    Colorectal cancer screening: Type of  screening: Colonoscopy. Completed 10/20/2018. Repeat every 7 years  Lung Cancer Screening: (Low Dose CT Chest recommended if Age 72-80ears, 30 pack-year currently smoking OR have quit w/in 15years.) does not qualify.   Lung Cancer Screening Referral: no  Additional Screening:  Hepatitis C Screening: does not qualify; Completed 03/20/2016  Vision Screening: Recommended annual ophthalmology exams for early detection of glaucoma and other disorders of the eye. Is the patient up to date with their annual eye exam?  Yes  Who is the provider or what is the name of the office in which the patient attends annual eye exams? [facility] [place] is not established with a provider, would they like to be referred to a provider to establish care? No .   Dental Screening: Recommended annual dental exams for proper oral hygiene  Community Resource Referral / Chronic Care Management: CRR required this visit?  No   CCM required this visit?  No      Plan:     I have personally reviewed and noted the following in the patient's chart:   Medical and social history Use of alcohol, tobacco or illicit drugs  Current medications and supplements including opioid prescriptions. Patient is not currently taking opioid prescriptions.  Functional ability and status Nutritional status Physical activity Advanced directives List of other physicians Hospitalizations, surgeries, and ER visits in previous 12 months Vitals Screenings to include cognitive, depression, and falls Referrals and appointments  In addition, I have reviewed and discussed with patient certain preventive protocols, quality metrics, and best practice recommendations. A written personalized care plan for preventive services as well as general preventive health recommendations were provided to patient.     Lebron Conners, LPN   [phone]   Nurse Notes: none

## 2022-05-24 NOTE — Telephone Encounter (Signed)
Patient is due for annual follow up, this will be required prior to any further refills.  Please schedule, thank you!

## 2022-06-22 ENCOUNTER — Ambulatory Visit (INDEPENDENT_AMBULATORY_CARE_PROVIDER_SITE_OTHER): Payer: Medicare Other | Admitting: Primary Care

## 2022-06-22 ENCOUNTER — Encounter: Payer: Self-pay | Admitting: Primary Care

## 2022-06-22 VITALS — BP 102/56 | HR 60 | Temp 97.5°F | Ht 70.0 in | Wt 171.0 lb

## 2022-06-22 DIAGNOSIS — D696 Thrombocytopenia, unspecified: Secondary | ICD-10-CM

## 2022-06-22 DIAGNOSIS — M25552 Pain in left hip: Secondary | ICD-10-CM | POA: Diagnosis not present

## 2022-06-22 DIAGNOSIS — G8929 Other chronic pain: Secondary | ICD-10-CM

## 2022-06-22 DIAGNOSIS — I480 Paroxysmal atrial fibrillation: Secondary | ICD-10-CM

## 2022-06-22 DIAGNOSIS — R739 Hyperglycemia, unspecified: Secondary | ICD-10-CM

## 2022-06-22 DIAGNOSIS — E785 Hyperlipidemia, unspecified: Secondary | ICD-10-CM

## 2022-06-22 DIAGNOSIS — M25551 Pain in right hip: Secondary | ICD-10-CM | POA: Diagnosis not present

## 2022-06-22 DIAGNOSIS — Z8546 Personal history of malignant neoplasm of prostate: Secondary | ICD-10-CM | POA: Diagnosis not present

## 2022-06-22 LAB — CBC WITH DIFFERENTIAL/PLATELET
Basophils Absolute: 0.1 10*3/uL (ref 0.0–0.1)
Basophils Relative: 1.1 % (ref 0.0–3.0)
Eosinophils Absolute: 0.1 10*3/uL (ref 0.0–0.7)
Eosinophils Relative: 1.8 % (ref 0.0–5.0)
HCT: 41.8 % (ref 39.0–52.0)
Hemoglobin: 14.4 g/dL (ref 13.0–17.0)
Lymphocytes Relative: 29.7 % (ref 12.0–46.0)
Lymphs Abs: 1.6 10*3/uL (ref 0.7–4.0)
MCHC: 34.5 g/dL (ref 30.0–36.0)
MCV: 90 fl (ref 78.0–100.0)
Monocytes Absolute: 0.5 10*3/uL (ref 0.1–1.0)
Monocytes Relative: 9 % (ref 3.0–12.0)
Neutro Abs: 3.2 10*3/uL (ref 1.4–7.7)
Neutrophils Relative %: 58.4 % (ref 43.0–77.0)
Platelets: 134 10*3/uL — ABNORMAL LOW (ref 150.0–400.0)
RBC: 4.64 Mil/uL (ref 4.22–5.81)
RDW: 12.7 % (ref 11.5–15.5)
WBC: 5.4 10*3/uL (ref 4.0–10.5)

## 2022-06-22 LAB — HEMOGLOBIN A1C: Hgb A1c MFr Bld: 5.5 % (ref 4.6–6.5)

## 2022-06-22 LAB — COMPREHENSIVE METABOLIC PANEL
ALT: 25 U/L (ref 0–53)
AST: 23 U/L (ref 0–37)
Albumin: 4.1 g/dL (ref 3.5–5.2)
Alkaline Phosphatase: 81 U/L (ref 39–117)
BUN: 23 mg/dL (ref 6–23)
CO2: 29 mEq/L (ref 19–32)
Calcium: 8.9 mg/dL (ref 8.4–10.5)
Chloride: 107 mEq/L (ref 96–112)
Creatinine, Ser: 1.12 mg/dL (ref 0.40–1.50)
GFR: 66.09 mL/min (ref >60.00)
Glucose, Bld: 97 mg/dL (ref 70–99)
Potassium: 4.4 mEq/L (ref 3.5–5.1)
Sodium: 138 mEq/L (ref 135–145)
Total Bilirubin: 1.1 mg/dL (ref 0.2–1.2)
Total Protein: 6.2 g/dL (ref 6.0–8.3)

## 2022-06-22 LAB — LIPID PANEL
Cholesterol: 128 mg/dL (ref 0–200)
HDL: 61.2 mg/dL (ref >39.00)
LDL Cholesterol: 58 mg/dL (ref 0–99)
NonHDL: 66.84
Total CHOL/HDL Ratio: 2
Triglycerides: 43 mg/dL (ref 0.0–149.0)
VLDL: 8.6 mg/dL (ref 0.0–40.0)

## 2022-06-22 NOTE — Assessment & Plan Note (Signed)
Rate and rhythm regular today.  Continue to monitor.  

## 2022-06-22 NOTE — Assessment & Plan Note (Signed)
Improved since completion of PT. Continue to monitor. No concerns today.

## 2022-06-22 NOTE — Assessment & Plan Note (Addendum)
Following with Urology. Appointment scheduled for 2 weeks.

## 2022-06-22 NOTE — Assessment & Plan Note (Signed)
Continue rosuvastatin 5 mg daily. Repeat lipid panel pending 

## 2022-06-22 NOTE — Assessment & Plan Note (Signed)
Stable. Repeat CBC pending.

## 2022-06-22 NOTE — Patient Instructions (Addendum)
Stop by the lab prior to leaving today. I will notify you of your results once received.   It was a pleasure to see you today!  

## 2022-06-22 NOTE — Progress Notes (Signed)
Subjective:    Patient ID: Brian Glass, male    DOB: 11-12-1950, 72 y.o.   MRN: [id number]  HPI  Brian Glass is a very pleasant 72 y.o. male with a history of prostate cancer with prostatectomy, hyperlipidemia, paroxymal atrial fibrillation, chronic hip pain who presents today for follow up of chronic conditions.  1) Hyperlipidemia: Currently managed on rosuvastatin 5 mg daily. He is due for repeat lipid panel today.  2) Paroxysmal Atrial Fibrillation: Not currently on anticoagulants or treatment for rate control. He underwent cardiac ablation in 2018 and has been without atrial fibrillation since.   3) Chronic Hip Pain: Chronic to bilateral hips, left worse than right. He completed PT which helped a lot with symptoms.   Immunizations: -Tetanus: Completed in 2014 -Influenza: Completed this season -Shingles: Completed Shingrix series -Pneumonia: Completed Prevnar 13 in 2018 and Pneumovax 23 in 2013  Colonoscopy: Completed in 2020, due 2027  PSA: Due      BP Readings from Last 3 Encounters:  06/22/22 (!) 102/56  05/10/22 101/65  03/20/22 118/60      Review of Systems  Constitutional:  Negative for unexpected weight change.  HENT:  Negative for rhinorrhea.   Respiratory:  Negative for cough and shortness of breath.   Cardiovascular:  Negative for chest pain.  Gastrointestinal:  Negative for constipation and diarrhea.  Genitourinary:  Negative for difficulty urinating.  Musculoskeletal:  Positive for arthralgias.  Skin:  Negative for rash.  Allergic/Immunologic: Negative for environmental allergies.  Neurological:  Negative for dizziness and headaches.  Psychiatric/Behavioral:  The patient is not nervous/anxious.          Past Medical History:  Diagnosis Date   Allergy    Arthritis    Atrial fibrillation    BCC (basal cell carcinoma) 11/08/2021   right medial cheek, Mohs 12/06/21   Bilateral vitreous detachment    Chickenpox    Hx of basal cell  carcinoma 2018   L malar cheek   Prostate cancer 2020   Typical atrial flutter     Social History   Socioeconomic History   Marital status: Married    Spouse name: Alvino Chapel   Number of children: Not on file   Years of education: Not on file   Highest education level: Not on file  Occupational History   Not on file  Tobacco Use   Smoking status: Never   Smokeless tobacco: Former    Types: Chew    Quit date: 1970  Vaping Use   Vaping Use: Never used  Substance and Sexual Activity   Alcohol use: Yes    Alcohol/week: 2.0 standard drinks of alcohol    Types: 2 Cans of beer per week    Comment: social   Drug use: No   Sexual activity: Yes    Birth control/protection: None  Other Topics Concern   Not on file  Social History Narrative   Married.   3 children. 2 grandchildren.   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   Enjoys biking, bird watching.   Social Determinants of Health   Financial Resource Strain: Low Risk  (05/24/2022)   Overall Financial Resource Strain (CARDIA)    Difficulty of Paying Living Expenses: Not hard at all  Food Insecurity: No Food Insecurity (05/24/2022)   Hunger Vital Sign    Worried About Running Out of Food in the Last Year: Never true    Ran Out of Food in the Last Year: Never true  Transportation Needs: No Transportation Needs (05/24/2022)  PRAPARE - Administrator, Civil Service (Medical): No    Lack of Transportation (Non-Medical): No  Physical Activity: Sufficiently Active (05/24/2022)   Exercise Vital Sign    Days of Exercise per Week: 7 days    Minutes of Exercise per Session: 60 min  Stress: No Stress Concern Present (05/24/2022)   [employer] of Occupational Health - Occupational Stress Questionnaire    Feeling of Stress : Not at all  Social Connections: Moderately Integrated (05/24/2022)   Social Connection and Isolation Panel [NHANES]    Frequency of Communication with Friends and Family: More than three times a week    Frequency  of Social Gatherings with Friends and Family: Three times a week    Attends Religious Services: Never    Active Member of Clubs or Organizations: Yes    Attends Banker Meetings: More than 4 times per year    Marital Status: Married  Catering manager Violence: Not At Risk (05/24/2022)   Humiliation, Afraid, Rape, and Kick questionnaire    Fear of Current or Ex-Partner: No    Emotionally Abused: No    Physically Abused: No    Sexually Abused: No    Past Surgical History:  Procedure Laterality Date   ATRIAL FIBRILLATION ABLATION N/A 05/10/2016   Procedure: Atrial Fibrillation Ablation;  Surgeon: Hillis Range, MD;  Location: [facility];  Service: Cardiovascular;  Laterality: N/A;   CATARACT EXTRACTION, BILATERAL  2016   COLONOSCOPY WITH PROPOFOL N/A 10/20/2018   Procedure: COLONOSCOPY WITH PROPOFOL;  Surgeon: Toney Reil, MD;  Location: [facility] ENDOSCOPY;  Service: Gastroenterology;  Laterality: N/A;   EYE SURGERY     MOHS SURGERY Left 2018   malar cheek, BCC   MOHS SURGERY Right    cheek 2023   ROBOT ASSISTED LAPAROSCOPIC RADICAL PROSTATECTOMY N/A 04/06/2019   Procedure: XI ROBOTIC ASSISTED LAPAROSCOPIC RADICAL PROSTATECTOMY;  Surgeon: Sondra Come, MD;  Location: [facility] ORS;  Service: Urology;  Laterality: N/A;   TONSILLECTOMY  1955    Family History  Problem Relation Age of Onset   Hyperlipidemia Mother    Heart disease Mother    Hypertension Mother    Hyperlipidemia Father    Heart disease Father    Stroke Father    Hypertension Father    Heart attack Father    Prostate cancer Brother    Atrial fibrillation Brother    Sleep apnea Brother    CAD Brother     No Known Allergies  Current Outpatient Medications on File Prior to Visit  Medication Sig Dispense Refill   Calcium Polycarbophil (EQ FIBER LAXATIVE PO) Take 1 capsule by mouth daily.      fluticasone (FLONASE) 50 MCG/ACT nasal spray Place 1 spray into both nostrils daily as needed for  allergies.      Multiple Vitamins-Minerals (SENIOR MULTIVITAMIN PLUS PO) Take 1 tablet by mouth daily.     Omega-3 Fatty Acids (FISH OIL PO) Take 1,300 mg by mouth daily.     rosuvastatin (CRESTOR) 5 MG tablet TAKE 1 TABLET EVERY EVENING FOR CHOLESTEROL 30 tablet 0   olopatadine (PATANOL) 0.1 % ophthalmic solution Place 1 drop into both eyes 2 (two) times daily as needed for allergies. (Patient not taking: Reported on 06/22/2022)     No current facility-administered medications on file prior to visit.    BP (!) 102/56   Pulse 60   Temp (!) 97.5 F (36.4 C) (Temporal)   Ht 5\' 10"  (1.778 m)  Wt 171 lb (77.6 kg)   SpO2 95%   BMI 24.54 kg/m  Objective:   Physical Exam Cardiovascular:     Rate and Rhythm: Normal rate and regular rhythm.  Pulmonary:     Effort: Pulmonary effort is normal.     Breath sounds: Normal breath sounds. No wheezing or rales.  Abdominal:     General: Bowel sounds are normal.     Palpations: Abdomen is soft.     Tenderness: There is no abdominal tenderness.  Musculoskeletal:     Cervical back: Neck supple.  Skin:    General: Skin is warm and dry.  Neurological:     Mental Status: He is alert and oriented to person, place, and time.           Assessment & Plan:  Hyperlipidemia, unspecified hyperlipidemia type Assessment & Plan: Continue rosuvastatin 5 mg daily. Repeat lipid panel pending.  Orders: -     Lipid panel -     Comprehensive metabolic panel  Paroxysmal atrial fibrillation Assessment & Plan: Rate and rhythm regular today. Continue to monitor.    Chronic pain of both hips Assessment & Plan: Improved since completion of PT. Continue to monitor. No concerns today.   History of prostate cancer Assessment & Plan: Following with Urology. Appointment scheduled for 2 weeks.   Thrombocytopenia Assessment & Plan: Stable. Repeat CBC pending.  Orders: -     CBC with Differential/Platelet  Hyperglycemia -     Hemoglobin  A1c        Doreene NestKatherine K Carrera Kiesel, NP

## 2022-07-03 ENCOUNTER — Other Ambulatory Visit: Payer: Medicare Other

## 2022-07-03 DIAGNOSIS — C61 Malignant neoplasm of prostate: Secondary | ICD-10-CM | POA: Diagnosis not present

## 2022-07-04 LAB — PSA: Prostate Specific Ag, Serum: <0.1 ng/mL (ref 0.0–4.0)

## 2022-07-05 ENCOUNTER — Ambulatory Visit (INDEPENDENT_AMBULATORY_CARE_PROVIDER_SITE_OTHER): Payer: Medicare Other | Admitting: Urology

## 2022-07-05 ENCOUNTER — Encounter: Payer: Self-pay | Admitting: Urology

## 2022-07-05 VITALS — BP 104/67 | HR 63 | Ht 70.0 in | Wt 165.0 lb

## 2022-07-05 DIAGNOSIS — Z8546 Personal history of malignant neoplasm of prostate: Secondary | ICD-10-CM

## 2022-07-05 DIAGNOSIS — N3942 Incontinence without sensory awareness: Secondary | ICD-10-CM

## 2022-07-05 DIAGNOSIS — N529 Male erectile dysfunction, unspecified: Secondary | ICD-10-CM | POA: Diagnosis not present

## 2022-07-05 DIAGNOSIS — C61 Malignant neoplasm of prostate: Secondary | ICD-10-CM

## 2022-07-05 MED ORDER — SILDENAFIL CITRATE 100 MG PO TABS: 100.0000 mg | ORAL_TABLET | Freq: Every day | ORAL | 11 refills | Status: AC | PRN

## 2022-07-05 NOTE — Patient Instructions (Signed)
You can order an external penile clamp for leakage from [employer], these are called a Cunningham clamp or a Wiesner clamp

## 2022-07-05 NOTE — Progress Notes (Signed)
   07/05/2022 2:24 PM   [name] 1950/07/27 [id number]  Reason for visit: Follow up prostate cancer, erectile dysfunction  HPI: I saw Mr. Feldner back in urology clinic for prostate cancer follow-up after undergoing robotic prostatectomy for favorable intermediate risk prostate cancer on 04/06/2019.  Final pathology showed Gleason score 3+4 =7 disease in 25% of the prostate with negative margins, stage pT2Nx.  A bilateral nerve sparing procedure was performed   His first post-op PSA on 06/15/2019 was <0.1.  PSA remains <0.1 in April 2024.  We discussed the need for ongoing PSA monitoring on a yearly basis.  He continues to report some mild occasional incontinence 3 to 4 days a week without realization.  This is not stress or urge incontinence.  He worked with physical therapy and did have some improvement, but he continues to wear a pad primarily for safety.  When he does have leakage it is just a very small amount.  He is not able to associate this with any activity or fluid intake.  I offered referral to [facility] to consider a male sling or even sphincter, but he is not bothered enough at this time to consider that.  We also discussed external options like a Wiesner or Cunningham clamp.  In terms of erections, currently getting sufficient erections with 100 mg sildenafil on demand.  This was refilled.  Sildenafil 100 mg as needed refilled Okay to place referral to Dr. Lafonda Mosses in [place] for consideration of urethral sling if patient desires in the future RTC 1 year PSA prior   Sondra Come, MD  [facility] [address] [place], [place] [address] [phone]

## 2022-08-23 ENCOUNTER — Ambulatory Visit (INDEPENDENT_AMBULATORY_CARE_PROVIDER_SITE_OTHER): Payer: Medicare Other | Admitting: Primary Care

## 2022-08-23 ENCOUNTER — Encounter: Payer: Self-pay | Admitting: Primary Care

## 2022-08-23 VITALS — BP 118/62 | HR 52 | Temp 97.5°F | Ht 70.0 in | Wt 168.0 lb

## 2022-08-23 DIAGNOSIS — W57XXXA Bitten or stung by nonvenomous insect and other nonvenomous arthropods, initial encounter: Secondary | ICD-10-CM

## 2022-08-23 DIAGNOSIS — S30860A Insect bite (nonvenomous) of lower back and pelvis, initial encounter: Secondary | ICD-10-CM | POA: Diagnosis not present

## 2022-08-23 HISTORY — DX: Bitten or stung by nonvenomous insect and other nonvenomous arthropods, initial encounter: W57.XXXA

## 2022-08-23 NOTE — Assessment & Plan Note (Signed)
With systemic symptoms that have since resolved.  Exam today benign.  Will defer testing for tickborne illness such as Lyme disease and alpha gal.  He will notify if he develops symptoms in the future.  We discussed symptoms of both alpha gal and Lyme disease.  He has since replaced his insect repellent pants.

## 2022-08-23 NOTE — Progress Notes (Signed)
Subjective:    Patient ID: Brian Glass, male    DOB: 12-11-1950, 72 y.o.   MRN: [id number]  HPI  Brian Glass is a very pleasant 72 y.o. male with a history of thrombocytopenia, prostatectomy, prostate cancer, hyperlipidemia, wart who presents today to discuss recent tick bite.  He is an avid Chiropractor.   He discovered a tick to his left buttocks on 07/31/2022 for which she was able to remove.  2 days later he found another tick attached to his left thigh for which she was also able to remove.  Both sites swelled with a small scab.  He then noticed a faint "ring" type rash around the tick site to his left buttocks which has since resolved.  Since then he has felt lethargic and fatigued, lower extremity joint aches, and headaches which have since resolved.   Today the left buttock rash has not returned. Headaches, joint aches, and fatigue have resolved. He denies abdominal pain, nausea. He's eaten lamb recently without any difficulty.   He's felt back to his normal self over the last 2 days. He does not wear insect repellant, but does wear bug repelling pants.   He did notice another tick to the right groin 5 days ago.  He removed the tick immediately which was small, noticed a few days of a small red bump, otherwise no complications.  The site has since healed.  Review of Systems  Constitutional:  Negative for fever.  Gastrointestinal:  Negative for abdominal pain and nausea.  Musculoskeletal:  Negative for arthralgias.  Neurological:  Negative for headaches.         Past Medical History:  Diagnosis Date   Allergy    Arthritis    Atrial fibrillation (HCC)    BCC (basal cell carcinoma) 11/08/2021   right medial cheek, Mohs 12/06/21   Bilateral vitreous detachment    Chickenpox    Hx of basal cell carcinoma 2018   L malar cheek   Prostate cancer (HCC) 2020   Typical atrial flutter (HCC)     Social History   Socioeconomic History   Marital status: Married     Spouse name: Alvino Chapel   Number of children: Not on file   Years of education: Not on file   Highest education level: Master's degree (e.g., MA, MS, MEng, MEd, MSW, MBA)  Occupational History   Not on file  Tobacco Use   Smoking status: Never    Passive exposure: Never   Smokeless tobacco: Former    Types: Chew    Quit date: 1970  Vaping Use   Vaping Use: Never used  Substance and Sexual Activity   Alcohol use: Yes    Alcohol/week: 2.0 standard drinks of alcohol    Types: 2 Cans of beer per week    Comment: social   Drug use: No   Sexual activity: Yes    Birth control/protection: None  Other Topics Concern   Not on file  Social History Narrative   Married.   3 children. 2 grandchildren.   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   Enjoys biking, bird watching.   Social Determinants of Health   Financial Resource Strain: Low Risk  (08/19/2022)   Overall Financial Resource Strain (CARDIA)    Difficulty of Paying Living Expenses: Not hard at all  Food Insecurity: No Food Insecurity (08/19/2022)   Hunger Vital Sign    Worried About Running Out of Food in the Last Year: Never true    Ran Out  of Food in the Last Year: Never true  Transportation Needs: No Transportation Needs (08/19/2022)   PRAPARE - Administrator, Civil Service (Medical): No    Lack of Transportation (Non-Medical): No  Physical Activity: Sufficiently Active (08/19/2022)   Exercise Vital Sign    Days of Exercise per Week: 4 days    Minutes of Exercise per Session: 60 min  Stress: No Stress Concern Present (08/19/2022)   [employer] of Occupational Health - Occupational Stress Questionnaire    Feeling of Stress : Not at all  Social Connections: Unknown (08/19/2022)   Social Connection and Isolation Panel [NHANES]    Frequency of Communication with Friends and Family: Once a week    Frequency of Social Gatherings with Friends and Family: Three times a week    Attends Religious Services: Patient declined    Active  Member of Clubs or Organizations: Yes    Attends Banker Meetings: 1 to 4 times per year    Marital Status: Married  Catering manager Violence: Not At Risk (05/24/2022)   Humiliation, Afraid, Rape, and Kick questionnaire    Fear of Current or Ex-Partner: No    Emotionally Abused: No    Physically Abused: No    Sexually Abused: No    Past Surgical History:  Procedure Laterality Date   ATRIAL FIBRILLATION ABLATION N/A 05/10/2016   Procedure: Atrial Fibrillation Ablation;  Surgeon: Hillis Range, MD;  Location: [facility];  Service: Cardiovascular;  Laterality: N/A;   CATARACT EXTRACTION, BILATERAL  2016   COLONOSCOPY WITH PROPOFOL N/A 10/20/2018   Procedure: COLONOSCOPY WITH PROPOFOL;  Surgeon: Toney Reil, MD;  Location: [facility] ENDOSCOPY;  Service: Gastroenterology;  Laterality: N/A;   EYE SURGERY     MOHS SURGERY Left 2018   malar cheek, BCC   MOHS SURGERY Right    cheek 2023   ROBOT ASSISTED LAPAROSCOPIC RADICAL PROSTATECTOMY N/A 04/06/2019   Procedure: XI ROBOTIC ASSISTED LAPAROSCOPIC RADICAL PROSTATECTOMY;  Surgeon: Sondra Come, MD;  Location: [facility] ORS;  Service: Urology;  Laterality: N/A;   TONSILLECTOMY  1955    Family History  Problem Relation Age of Onset   Hyperlipidemia Mother    Heart disease Mother    Hypertension Mother    Hyperlipidemia Father    Heart disease Father    Stroke Father    Hypertension Father    Heart attack Father    Prostate cancer Brother    Atrial fibrillation Brother    Sleep apnea Brother    CAD Brother     No Known Allergies  Current Outpatient Medications on File Prior to Visit  Medication Sig Dispense Refill   Calcium Polycarbophil (EQ FIBER LAXATIVE PO) Take 1 capsule by mouth daily.      fluticasone (FLONASE) 50 MCG/ACT nasal spray Place 1 spray into both nostrils daily as needed for allergies.      Multiple Vitamins-Minerals (SENIOR MULTIVITAMIN PLUS PO) Take 1 tablet by mouth daily.     Omega-3  Fatty Acids (FISH OIL PO) Take 1,300 mg by mouth daily.     rosuvastatin (CRESTOR) 5 MG tablet TAKE 1 TABLET EVERY EVENING FOR CHOLESTEROL 30 tablet 0   sildenafil (VIAGRA) 100 MG tablet Take 1 tablet (100 mg total) by mouth daily as needed for erectile dysfunction. 30 tablet 11   No current facility-administered medications on file prior to visit.    BP 118/62   Pulse (!) 52   Temp (!) 97.5 F (36.4 C) (Temporal)  Ht 5\' 10"  (1.778 m)   Wt 168 lb (76.2 kg)   SpO2 99%   BMI 24.11 kg/m  Objective:   Physical Exam Cardiovascular:     Rate and Rhythm: Normal rate and regular rhythm.  Pulmonary:     Effort: Pulmonary effort is normal.     Breath sounds: Normal breath sounds.  Skin:    General: Skin is warm and dry.     Findings: No erythema or rash.           Assessment & Plan:  Tick bite, unspecified site, initial encounter Assessment & Plan: With systemic symptoms that have since resolved.  Exam today benign.  Will defer testing for tickborne illness such as Lyme disease and alpha gal.  He will notify if he develops symptoms in the future.  We discussed symptoms of both alpha gal and Lyme disease.  He has since replaced his insect repellent pants.         Doreene Nest, NP

## 2022-08-23 NOTE — Patient Instructions (Signed)
Please notify me if you develop any of the symptoms that we discussed.  It was a pleasure to see you today!

## 2022-09-19 ENCOUNTER — Other Ambulatory Visit: Payer: Self-pay | Admitting: Primary Care

## 2022-09-19 DIAGNOSIS — E785 Hyperlipidemia, unspecified: Secondary | ICD-10-CM

## 2022-11-08 ENCOUNTER — Encounter: Payer: Medicare Other | Admitting: Dermatology

## 2022-11-20 ENCOUNTER — Other Ambulatory Visit: Payer: Self-pay | Admitting: Oncology

## 2022-11-20 DIAGNOSIS — Z006 Encounter for examination for normal comparison and control in clinical research program: Secondary | ICD-10-CM

## 2022-11-27 ENCOUNTER — Encounter: Payer: Self-pay | Admitting: Dermatology

## 2022-11-27 ENCOUNTER — Ambulatory Visit (INDEPENDENT_AMBULATORY_CARE_PROVIDER_SITE_OTHER): Payer: Medicare Other | Admitting: Dermatology

## 2022-11-27 VITALS — BP 113/76 | HR 64

## 2022-11-27 DIAGNOSIS — L820 Inflamed seborrheic keratosis: Secondary | ICD-10-CM

## 2022-11-27 DIAGNOSIS — L570 Actinic keratosis: Secondary | ICD-10-CM

## 2022-11-27 DIAGNOSIS — L578 Other skin changes due to chronic exposure to nonionizing radiation: Secondary | ICD-10-CM

## 2022-11-27 DIAGNOSIS — D2372 Other benign neoplasm of skin of left lower limb, including hip: Secondary | ICD-10-CM | POA: Diagnosis not present

## 2022-11-27 DIAGNOSIS — W908XXA Exposure to other nonionizing radiation, initial encounter: Secondary | ICD-10-CM | POA: Diagnosis not present

## 2022-11-27 DIAGNOSIS — D2371 Other benign neoplasm of skin of right lower limb, including hip: Secondary | ICD-10-CM

## 2022-11-27 DIAGNOSIS — Z872 Personal history of diseases of the skin and subcutaneous tissue: Secondary | ICD-10-CM

## 2022-11-27 DIAGNOSIS — Z1283 Encounter for screening for malignant neoplasm of skin: Secondary | ICD-10-CM | POA: Diagnosis not present

## 2022-11-27 DIAGNOSIS — Z85828 Personal history of other malignant neoplasm of skin: Secondary | ICD-10-CM

## 2022-11-27 DIAGNOSIS — L814 Other melanin hyperpigmentation: Secondary | ICD-10-CM | POA: Diagnosis not present

## 2022-11-27 DIAGNOSIS — D492 Neoplasm of unspecified behavior of bone, soft tissue, and skin: Secondary | ICD-10-CM

## 2022-11-27 DIAGNOSIS — L821 Other seborrheic keratosis: Secondary | ICD-10-CM

## 2022-11-27 DIAGNOSIS — D1801 Hemangioma of skin and subcutaneous tissue: Secondary | ICD-10-CM | POA: Diagnosis not present

## 2022-11-27 DIAGNOSIS — D239 Other benign neoplasm of skin, unspecified: Secondary | ICD-10-CM

## 2022-11-27 DIAGNOSIS — D229 Melanocytic nevi, unspecified: Secondary | ICD-10-CM

## 2022-11-27 NOTE — Addendum Note (Signed)
Addended by: Lawson Radar on: 11/27/2022 09:53 AM   Modules accepted: Orders

## 2022-11-27 NOTE — Progress Notes (Addendum)
Follow-Up Visit   Subjective  Brian Glass is a 72 y.o. male who presents for the following: Skin Cancer Screening and Full Body Skin Exam. Hx of BCCs, Hx of Aks. Rough areas on face. Irritated lesion on right side.   The patient presents for Total-Body Skin Exam (TBSE) for skin cancer screening and mole check. The patient has spots, moles and lesions to be evaluated, some may be new or changing and the patient may have concern these could be cancer.    The following portions of the chart were reviewed this encounter and updated as appropriate: medications, allergies, medical history  Review of Systems:  No other skin or systemic complaints except as noted in HPI or Assessment and Plan.  Objective  Well appearing patient in no apparent distress; mood and affect are within normal limits.  A full examination was performed including scalp, head, eyes, ears, nose, lips, neck, chest, axillae, abdomen, back, buttocks, bilateral upper extremities, bilateral lower extremities, hands, feet, fingers, toes, fingernails, and toenails. All findings within normal limits unless otherwise noted below.   Relevant physical exam findings are noted in the Assessment and Plan.  Right Mid Back x1 Erythematous keratotic or waxy stuck-on papule or plaque.  R zygoma x1, L temple x3, L antihelix x1, L nasal dorsum x1 (6) Erythematous thin papules/macules with gritty scale.   right upper back 7 mm pink thin papule         Assessment & Plan   History of Basal Cell Carcinoma of the Skin. Left malar cheek 2018. Right medial cheek, Mohs 12/06/2021. - No evidence of recurrence today - Recommend regular full body skin exams - Recommend daily broad spectrum sunscreen SPF 30+ to sun-exposed areas, reapply every 2 hours as needed.  - Call if any new or changing lesions are noted between office visits   SKIN CANCER SCREENING PERFORMED TODAY.  ACTINIC DAMAGE - Chronic condition, secondary to cumulative  UV/sun exposure - diffuse scaly erythematous macules with underlying dyspigmentation - Recommend daily broad spectrum sunscreen SPF 30+ to sun-exposed areas, reapply every 2 hours as needed.  - Staying in the shade or wearing long sleeves, sun glasses (UVA+UVB protection) and wide brim hats (4-inch brim around the entire circumference of the hat) are also recommended for sun protection.  - Call for new or changing lesions.  LENTIGINES, SEBORRHEIC KERATOSES, HEMANGIOMAS - Benign normal skin lesions - Benign-appearing - Call for any changes  MELANOCYTIC NEVI - Tan-brown and/or pink-flesh-colored symmetric macules and papules - Benign appearing on exam today - Observation - Call clinic for new or changing moles - Recommend daily use of broad spectrum spf 30+ sunscreen to sun-exposed areas.   Likely Inflamed SK Exam: Pink scaly macule at R lateral thigh  Treatment:  Benign, observe.   DERMATOFIBROMA Exam: Firm pink/brown papulenodule with dimple sign at left medial thigh, right lateral knee. Treatment Plan: A dermatofibroma is a benign growth possibly related to trauma, such as an insect bite, cut from shaving, or inflamed acne-type bump.  Treatment options to remove include shave or excision with resulting scar and risk of recurrence.  Since benign-appearing and not bothersome, will observe for now.     Inflamed seborrheic keratosis Right Mid Back x1  Symptomatic, irritating, patient would like treated.  Destruction of lesion - Right Mid Back x1  Destruction method: cryotherapy   Informed consent: discussed and consent obtained   Lesion destroyed using liquid nitrogen: Yes   Region frozen until ice ball extended beyond lesion: Yes  Outcome: patient tolerated procedure well with no complications   Post-procedure details: wound care instructions given   Additional details:  Prior to procedure, discussed risks of blister formation, small wound, skin dyspigmentation, or rare scar  following cryotherapy. Recommend Vaseline ointment to treated areas while healing.   AK (actinic keratosis) (6) R zygoma x1, L temple x3, L antihelix x1, L nasal dorsum x1  Actinic keratoses are precancerous spots that appear secondary to cumulative UV radiation exposure/sun exposure over time. They are chronic with expected duration over 1 year. A portion of actinic keratoses will progress to squamous cell carcinoma of the skin. It is not possible to reliably predict which spots will progress to skin cancer and so treatment is recommended to prevent development of skin cancer.  Recommend daily broad spectrum sunscreen SPF 30+ to sun-exposed areas, reapply every 2 hours as needed.  Recommend staying in the shade or wearing long sleeves, sun glasses (UVA+UVB protection) and wide brim hats (4-inch brim around the entire circumference of the hat). Call for new or changing lesions.  Destruction of lesion - R zygoma x1, L temple x3, L antihelix x1, L nasal dorsum x1 (6)  Destruction method: cryotherapy   Timeout:  patient name, date of birth, surgical site, and procedure verified Lesion destroyed using liquid nitrogen: Yes   Region frozen until ice ball extended beyond lesion: Yes   Cryotherapy cycles:  1 Outcome: patient tolerated procedure well with no complications   Post-procedure details: wound care instructions given    Neoplasm of skin right upper back  Skin / nail biopsy Type of biopsy: tangential   Informed consent: discussed and consent obtained   Anesthesia: the lesion was anesthetized in a standard fashion   Anesthesia comment:  Area prepped with alcohol Anesthetic:  1% lidocaine w/ epinephrine 1-100,000 buffered w/ 8.4% NaHCO3 Instrument used: DermaBlade   Hemostasis achieved with: pressure and aluminum chloride   Outcome: patient tolerated procedure well   Post-procedure details: wound care instructions given   Post-procedure details comment:  Ointment and small bandage  applied  Specimen 1 - Surgical pathology Differential Diagnosis: ISK vs SCC vs nevus vs melanoma  Check Margins: No  Lentigines  Actinic elastosis  Seborrheic keratoses  Multiple benign nevi  Cherry angioma  Dermatofibroma   Return in about 1 year (around 11/27/2023) for TBSE, HxBCC.  I, Lawson Radar, CMA, am acting as scribe for Elie Goody, MD.   Documentation: I have reviewed the above documentation for accuracy and completeness, and I agree with the above.  Elie Goody, MD

## 2022-11-27 NOTE — Patient Instructions (Addendum)
Cryotherapy Aftercare  Wash gently with soap and water everyday.   Apply Vaseline and Band-Aid daily until healed.   Wound Care Instructions  Cleanse wound gently with soap and water once a day then pat dry with clean gauze. Apply a thin coat of Petrolatum (petroleum jelly, "Vaseline") over the wound (unless you have an allergy to this). We recommend that you use a new, sterile tube of Vaseline. Do not pick or remove scabs. Do not remove the yellow or white "healing tissue" from the base of the wound.  Cover the wound with fresh, clean, nonstick gauze and secure with paper tape. You may use Band-Aids in place of gauze and tape if the wound is small enough, but would recommend trimming much of the tape off as there is often too much. Sometimes Band-Aids can irritate the skin.  You should call the office for your biopsy report after 1 week if you have not already been contacted.  If you experience any problems, such as abnormal amounts of bleeding, swelling, significant bruising, significant pain, or evidence of infection, please call the office immediately.  FOR ADULT SURGERY PATIENTS: If you need something for pain relief you may take 1 extra strength Tylenol (acetaminophen) AND 2 Ibuprofen (200mg  each) together every 4 hours as needed for pain. (do not take these if you are allergic to them or if you have a reason you should not take them.) Typically, you may only need pain medication for 1 to 3 days.      Recommend daily broad spectrum sunscreen SPF 30+ to sun-exposed areas, reapply every 2 hours as needed. Call for new or changing lesions.  Staying in the shade or wearing long sleeves, sun glasses (UVA+UVB protection) and wide brim hats (4-inch brim around the entire circumference of the hat) are also recommended for sun protection.    Melanoma ABCDEs  Melanoma is the most dangerous type of skin cancer, and is the leading cause of death from skin disease.  You are more likely to develop  melanoma if you: Have light-colored skin, light-colored eyes, or red or blond hair Spend a lot of time in the sun Tan regularly, either outdoors or in a tanning bed Have had blistering sunburns, especially during childhood Have a close family member who has had a melanoma Have atypical moles or large birthmarks  Early detection of melanoma is key since treatment is typically straightforward and cure rates are extremely high if we catch it early.   The first sign of melanoma is often a change in a mole or a new dark spot.  The ABCDE system is a way of remembering the signs of melanoma.  A for asymmetry:  The two halves do not match. B for border:  The edges of the growth are irregular. C for color:  A mixture of colors are present instead of an even brown color. D for diameter:  Melanomas are usually (but not always) greater than 6mm - the size of a pencil eraser. E for evolution:  The spot keeps changing in size, shape, and color.  Please check your skin once per month between visits. You can use a small mirror in front and a large mirror behind you to keep an eye on the back side or your body.   If you see any new or changing lesions before your next follow-up, please call to schedule a visit.  Please continue daily skin protection including broad spectrum sunscreen SPF 30+ to sun-exposed areas, reapplying every 2 hours  as needed when you're outdoors.   Staying in the shade or wearing long sleeves, sun glasses (UVA+UVB protection) and wide brim hats (4-inch brim around the entire circumference of the hat) are also recommended for sun protection.    Due to recent changes in healthcare laws, you may see results of your pathology and/or laboratory studies on MyChart before the doctors have had a chance to review them. We understand that in some cases there may be results that are confusing or concerning to you. Please understand that not all results are received at the same time and often the  doctors may need to interpret multiple results in order to provide you with the best plan of care or course of treatment. Therefore, we ask that you please give [place] 2 business days to thoroughly review all your results before contacting the office for clarification. Should we see a critical lab result, you will be contacted sooner.   If You Need Anything After Your Visit  If you have any questions or concerns for your doctor, please call our main line at [phone] and press option 4 to reach your doctor's medical assistant. If no one answers, please leave a voicemail as directed and we will return your call as soon as possible. Messages left after 4 pm will be answered the following business day.   You may also send [place] a message via MyChart. We typically respond to MyChart messages within 1-2 business days.  For prescription refills, please ask your pharmacy to contact our office. Our fax number is [phone].  If you have an urgent issue when the clinic is closed that cannot wait until the next business day, you can page your doctor at the number below.    Please note that while we do our best to be available for urgent issues outside of office hours, we are not available 24/7.   If you have an urgent issue and are unable to reach [place], you may choose to seek medical care at your doctor's office, retail clinic, urgent care center, or emergency room.  If you have a medical emergency, please immediately call 911 or go to the emergency department.  Pager Numbers  - Dr. Gwen Pounds: [phone]  - Dr. Roseanne Reno: [phone]  - Dr. Katrinka Blazing: [phone]   In the event of inclement weather, please call our main line at [phone] for an update on the status of any delays or closures.  Dermatology Medication Tips: Please keep the boxes that topical medications come in in order to help keep track of the instructions about where and how to use these. Pharmacies typically print the medication  instructions only on the boxes and not directly on the medication tubes.   If your medication is too expensive, please contact our office at [phone] option 4 or send [place] a message through MyChart.   We are unable to tell what your co-pay for medications will be in advance as this is different depending on your insurance coverage. However, we may be able to find a substitute medication at lower cost or fill out paperwork to get insurance to cover a needed medication.   If a prior authorization is required to get your medication covered by your insurance company, please allow [place] 1-2 business days to complete this process.  Drug prices often vary depending on where the prescription is filled and some pharmacies may offer cheaper prices.  The website www.goodrx.com contains coupons for medications through different pharmacies. The prices here do not account for  what the cost may be with help from insurance (it may be cheaper with your insurance), but the website can give you the price if you did not use any insurance.  - You can print the associated coupon and take it with your prescription to the pharmacy.  - You may also stop by our office during regular business hours and pick up a GoodRx coupon card.  - If you need your prescription sent electronically to a different pharmacy, notify our office through [facility] or by phone at [phone] option 4.     Si Usted Necesita Algo Despus de Su Visita  Tambin puede enviarnos un mensaje a travs de Clinical cytogeneticist. Por lo general respondemos a los mensajes de MyChart en el transcurso de 1 a 2 das hbiles.  Para renovar recetas, por favor pida a su farmacia que se ponga en contacto con nuestra oficina. Annie Sable de fax es [place] [phone].  Si tiene un asunto urgente cuando la clnica est cerrada y que no puede esperar hasta el siguiente da hbil, puede llamar/localizar a su doctor(a) al nmero que aparece a continuacin.   Por  favor, tenga en cuenta que aunque hacemos todo lo posible para estar disponibles para asuntos urgentes fuera del horario de [place], no estamos disponibles las 24 horas del da, los 7 [address]  [address] de la [place].   Si tiene un problema urgente y no puede comunicarse con nosotros, puede optar por buscar atencin mdica  en el consultorio de su doctor(a), en una clnica privada, en un centro de atencin urgente o en una sala de emergencias.  Si tiene Engineer, drilling, por favor llame inmediatamente al 911 o vaya a la sala de emergencias.  Nmeros de bper  - Dr. Gwen Pounds: [phone]  - Dra. Roseanne Reno: [phone]  - Dr. Katrinka Blazing: [phone]   En caso de inclemencias del tiempo, por favor llame a Lacy Duverney principal al [phone] para una actualizacin sobre el [place] de cualquier retraso o cierre.  Consejos para la medicacin en dermatologa: Por favor, guarde las cajas en las que vienen los medicamentos de uso tpico para ayudarle a seguir las instrucciones sobre dnde y cmo usarlos. Las farmacias generalmente imprimen las instrucciones del medicamento slo en las cajas y no directamente en los tubos del [place].   Si su medicamento es muy caro, por favor, pngase en contacto con Rolm Gala llamando al [phone] y presione la opcin 4 o envenos un mensaje a travs de Clinical cytogeneticist.   No podemos decirle cul ser su copago por los medicamentos por adelantado ya que esto es diferente dependiendo de la cobertura de su seguro. Sin embargo, es posible que podamos encontrar un medicamento sustituto a Audiological scientist un formulario para que el seguro cubra el medicamento que se considera necesario.   Si se requiere una autorizacin previa para que su compaa de seguros [place] su medicamento, por favor permtanos de 1 a 2 das hbiles para completar [address]  Los precios de los medicamentos varan con frecuencia dependiendo del Environmental consultant de dnde se surte la receta y alguna farmacias  pueden ofrecer precios ms baratos.  El sitio web www.goodrx.com tiene cupones para medicamentos de Health and safety inspector. Los precios aqu no tienen en cuenta lo que podra costar con la ayuda del seguro (puede ser ms barato con su seguro), pero el sitio web puede darle el precio si no utiliz Tourist information centre manager.  - Puede imprimir el cupn correspondiente y llevarlo con su receta a la farmacia.  Laroy Apple  puede pasar por nuestra oficina durante el horario de atencin regular y Education officer, museum una tarjeta de cupones de GoodRx.  - Si necesita que su receta se enve electrnicamente a una farmacia diferente, informe a nuestra oficina a travs de MyChart de [place] o por telfono llamando al [phone] y presione la opcin 4.

## 2022-11-30 LAB — SURGICAL PATHOLOGY

## 2022-12-03 DIAGNOSIS — Z23 Encounter for immunization: Secondary | ICD-10-CM | POA: Diagnosis not present

## 2023-01-21 ENCOUNTER — Other Ambulatory Visit: Payer: Medicare Other | Attending: Oncology

## 2023-01-29 ENCOUNTER — Encounter: Payer: Self-pay | Admitting: Primary Care

## 2023-01-29 ENCOUNTER — Ambulatory Visit (INDEPENDENT_AMBULATORY_CARE_PROVIDER_SITE_OTHER): Payer: Medicare Other | Admitting: Primary Care

## 2023-01-29 VITALS — BP 134/78 | HR 64 | Temp 97.8°F | Ht 70.0 in | Wt 168.0 lb

## 2023-01-29 DIAGNOSIS — M545 Low back pain, unspecified: Secondary | ICD-10-CM | POA: Diagnosis not present

## 2023-01-29 DIAGNOSIS — G8929 Other chronic pain: Secondary | ICD-10-CM | POA: Insufficient documentation

## 2023-01-29 MED ORDER — CYCLOBENZAPRINE HCL 5 MG PO TABS
5.0000 mg | ORAL_TABLET | Freq: Every evening | ORAL | 0 refills | Status: DC | PRN
Start: 2023-01-29 — End: 2023-09-25

## 2023-01-29 NOTE — Patient Instructions (Signed)
You may take the muscle relaxer medication (cyclobenzaprine) at bedtime as needed for muscle spasms.  This may cause drowsiness.  You can take naproxen as needed.  Stretch and walk as discussed.  It was a pleasure to see you today!

## 2023-01-29 NOTE — Progress Notes (Signed)
Subjective:    Patient ID: Brian Glass, male    DOB: [birth year], 72 y.o.   MRN: [id number]  Back Pain Pertinent negatives include no numbness or weakness.    Brian Glass is a very pleasant 72 y.o. male with a history of chronic hip pain, thrombocytopenia, paroxysmal atrial fibrillation, prostate cancer who presents today to discuss back pain.  Symptom onset 8 days ago with acute bilateral lower back pain.   Prior to symptom onset he had been loading heavy bags of dirt. Just prior to symptom onset he was bending forward to mend a fence, lifting up and felt a sudden onset of bilateral lower back pain. Since then his lower back pain has continued to bother him. His pain is worse in the morning when getting out of bed or standing from sitting after a while.   He denies lower extremity pain, numbness/tingling, loss of bowel/bladder control.   He took Naproxen for a few days. Today he's feeling slightly better. He participated in a spin class yesterday, was able to complete the exercise, but had to take it easy. He did notice some soreness to the lower back afterward.    Review of Systems  Genitourinary:        No loss of bowel/bladder control  Musculoskeletal:  Positive for back pain and myalgias.  Neurological:  Negative for weakness and numbness.         Past Medical History:  Diagnosis Date   Allergy    Arthritis    Atrial fibrillation (HCC)    BCC (basal cell carcinoma) 11/08/2021   right medial cheek, Mohs 12/06/21   Bilateral vitreous detachment    Chickenpox    Hx of basal cell carcinoma 2018   L malar cheek   Prostate cancer (HCC) 2020   Typical atrial flutter (HCC)     Social History   Socioeconomic History   Marital status: Married    Spouse name: Alvino Chapel   Number of children: Not on file   Years of education: Not on file   Highest education level: Master's degree (e.g., MA, MS, MEng, MEd, MSW, MBA)  Occupational History   Not on file  Tobacco Use    Smoking status: Never    Passive exposure: Never   Smokeless tobacco: Former    Types: Chew    Quit date: 1970  Vaping Use   Vaping status: Never Used  Substance and Sexual Activity   Alcohol use: Yes    Alcohol/week: 2.0 standard drinks of alcohol    Types: 2 Cans of beer per week    Comment: social   Drug use: No   Sexual activity: Yes    Birth control/protection: None  Other Topics Concern   Not on file  Social History Narrative   Married.   3 children. 2 grandchildren.   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   Enjoys biking, bird watching.   Social Determinants of Health   Financial Resource Strain: Low Risk  (01/28/2023)   Overall Financial Resource Strain (CARDIA)    Difficulty of Paying Living Expenses: Not hard at all  Food Insecurity: No Food Insecurity (01/28/2023)   Hunger Vital Sign    Worried About Running Out of Food in the Last Year: Never true    Ran Out of Food in the Last Year: Never true  Transportation Needs: No Transportation Needs (01/28/2023)   PRAPARE - Administrator, Civil Service (Medical): No    Lack of Transportation (Non-Medical): No  Physical Activity: Sufficiently Active (01/28/2023)   Exercise Vital Sign    Days of Exercise per Week: 4 days    Minutes of Exercise per Session: 50 min  Stress: No Stress Concern Present (08/19/2022)   [employer] of Occupational Health - Occupational Stress Questionnaire    Feeling of Stress : Not at all  Social Connections: Unknown (01/28/2023)   Social Connection and Isolation Panel [NHANES]    Frequency of Communication with Friends and Family: Once a week    Frequency of Social Gatherings with Friends and Family: More than three times a week    Attends Religious Services: Patient declined    Active Member of Clubs or Organizations: Yes    Attends Banker Meetings: More than 4 times per year    Marital Status: Married  Catering manager Violence: Not At Risk (05/24/2022)    Humiliation, Afraid, Rape, and Kick questionnaire    Fear of Current or Ex-Partner: No    Emotionally Abused: No    Physically Abused: No    Sexually Abused: No    Past Surgical History:  Procedure Laterality Date   ATRIAL FIBRILLATION ABLATION N/A 05/10/2016   Procedure: Atrial Fibrillation Ablation;  Surgeon: Hillis Range, MD;  Location: [facility]IVE [facility];  Service: Cardiovascular;  Laterality: N/A;   CATARACT EXTRACTION, BILATERAL  2016   COLONOSCOPY WITH PROPOFOL N/A 10/20/2018   Procedure: COLONOSCOPY WITH PROPOFOL;  Surgeon: Toney Reil, MD;  [facility] ENDOSCOPY;  Service: Gastroenterology;  Laterality: N/A;   EYE SURGERY     MOHS SURGERY Left 2018   malar cheek, BCC   MOHS SURGERY Right    cheek 2023   ROBOT ASSISTED LAPAROSCOPIC RADICAL PROSTATECTOMY N/A 04/06/2019   Procedure: XI ROBOTIC ASSISTED LAPAROSCOPIC RADICAL PROSTATECTOMY;  Surgeon: Sondra Come, MD;  Location: [facility] ORS;  Service: Urology;  Laterality: N/A;   TONSILLECTOMY  1955    Family History  Problem Relation Age of Onset   Hyperlipidemia Mother    Heart disease Mother    Hypertension Mother    Hyperlipidemia Father    Heart disease Father    Stroke Father    Hypertension Father    Heart attack Father    Prostate cancer Brother    Atrial fibrillation Brother    Sleep apnea Brother    CAD Brother     No Known Allergies  Current Outpatient Medications on File Prior to Visit  Medication Sig Dispense Refill   Calcium Polycarbophil (EQ FIBER LAXATIVE PO) Take 1 capsule by mouth daily.      fluticasone (FLONASE) 50 MCG/ACT nasal spray Place 1 spray into both nostrils daily as needed for allergies.      Multiple Vitamins-Minerals (SENIOR MULTIVITAMIN PLUS PO) Take 1 tablet by mouth daily.     Omega-3 Fatty Acids (FISH OIL PO) Take 1,300 mg by mouth daily.     rosuvastatin (CRESTOR) 5 MG tablet Take 1 tablet (5 mg total) by mouth daily. for cholesterol. 90 tablet 2   sildenafil  (VIAGRA) 100 MG tablet Take 1 tablet (100 mg total) by mouth daily as needed for erectile dysfunction. 30 tablet 11   No current facility-administered medications on file prior to visit.    BP 134/78   Pulse 64   Temp 97.8 F (36.6 C) (Temporal)   Ht 5\' 10"  (1.778 m)   Wt 168 lb (76.2 kg)   SpO2 100%   BMI 24.11 kg/m  Objective:   Physical Exam Cardiovascular:  Rate and Rhythm: Normal rate and regular rhythm.  Pulmonary:     Effort: Pulmonary effort is normal.     Breath sounds: Normal breath sounds.  Musculoskeletal:     Cervical back: Neck supple.     Lumbar back: No tenderness or bony tenderness. Normal range of motion. Negative right straight leg raise test and negative left straight leg raise test.       Back:  Skin:    General: Skin is warm and dry.  Neurological:     Mental Status: He is alert and oriented to person, place, and time.  Psychiatric:        Mood and Affect: Mood normal.           Assessment & Plan:  Acute bilateral low back pain without sciatica Assessment & Plan: Suspect lumbar strain. Fortunately, no alarm signs during HPI or exam.  Discussed the importance of stretching and walking.  Start cyclobenzaprine 5 mg at bedtime as needed.  Drowsiness precautions provided. Continue naproxen as needed.  Return precautions provided.  Orders: -     Cyclobenzaprine HCl; Take 1 tablet (5 mg total) by mouth at bedtime as needed for muscle spasms.  Dispense: 14 tablet; Refill: 0        Doreene Nest, NP

## 2023-01-29 NOTE — Assessment & Plan Note (Signed)
Suspect lumbar strain. Fortunately, no alarm signs during HPI or exam.  Discussed the importance of stretching and walking.  Start cyclobenzaprine 5 mg at bedtime as needed.  Drowsiness precautions provided. Continue naproxen as needed.  Return precautions provided.

## 2023-03-22 DIAGNOSIS — N3945 Continuous leakage: Secondary | ICD-10-CM

## 2023-04-09 ENCOUNTER — Encounter: Payer: Self-pay | Admitting: Physical Therapy

## 2023-04-09 ENCOUNTER — Ambulatory Visit: Payer: Medicare Other | Attending: Urology | Admitting: Physical Therapy

## 2023-04-09 DIAGNOSIS — M533 Sacrococcygeal disorders, not elsewhere classified: Secondary | ICD-10-CM | POA: Insufficient documentation

## 2023-04-09 DIAGNOSIS — R2689 Other abnormalities of gait and mobility: Secondary | ICD-10-CM | POA: Diagnosis not present

## 2023-04-09 DIAGNOSIS — N3945 Continuous leakage: Secondary | ICD-10-CM | POA: Insufficient documentation

## 2023-04-09 NOTE — Patient Instructions (Addendum)
  KEGEL PROGRAM  Exercise check off  All the exercises on your back in the morning and night    Mon Tue Wed Thurs Fri  Sat Sun   Before deep core level 1:  _4_ sec hold,  2 rest breath,  _3_ reps                          Before deep core level 2  _4_ sec hold,  2 rest breath,  _3_ reps             After  deep core level 2  __4 sec hold,  2 rest breath,  _3_ reps          TOTAL of 6 sets of the endurance kegels  if you the above routine in the m orning and night   __   ----Buy shoe lifts and replace every 6 months

## 2023-04-09 NOTE — Therapy (Signed)
OUTPATIENT PHYSICAL THERAPY EVALUATION   Patient Name: Brian Glass MRN: [id number] DOB:08-Aug-1950, 73 y.o., male Today's Date: 04/09/2023   PT End of Session - 04/09/23 1428     Visit Number 1    Number of Visits 10    Date for PT Re-Evaluation 06/18/23    PT Start Time 1423    PT Stop Time 1502    PT Time Calculation (min) 39 min    Activity Tolerance Patient tolerated treatment well;No increased pain             Past Medical History:  Diagnosis Date   Allergy    Arthritis    Atrial fibrillation (HCC)    BCC (basal cell carcinoma) 11/08/2021   right medial cheek, Mohs 12/06/21   Bilateral vitreous detachment    Chickenpox    Hx of basal cell carcinoma 2018   L malar cheek   Prostate cancer (HCC) 2020   Typical atrial flutter (HCC)    Past Surgical History:  Procedure Laterality Date   ATRIAL FIBRILLATION ABLATION N/A 05/10/2016   Procedure: Atrial Fibrillation Ablation;  Surgeon: Hillis Range, MD;  Location: [facility] INVASIVE CV LAB;  Service: Cardiovascular;  Laterality: N/A;   CATARACT EXTRACTION, BILATERAL  2016   COLONOSCOPY WITH PROPOFOL N/A 10/20/2018   Procedure: COLONOSCOPY WITH PROPOFOL;  Surgeon: Toney Reil, MD;  Location: [facility] ENDOSCOPY;  Service: Gastroenterology;  Laterality: N/A;   EYE SURGERY     MOHS SURGERY Left 2018   malar cheek, BCC   MOHS SURGERY Right    cheek 2023   ROBOT ASSISTED LAPAROSCOPIC RADICAL PROSTATECTOMY N/A 04/06/2019   Procedure: XI ROBOTIC ASSISTED LAPAROSCOPIC RADICAL PROSTATECTOMY;  Surgeon: Sondra Come, MD;  Location: [facility] ORS;  Service: Urology;  Laterality: N/A;   TONSILLECTOMY  1955   Patient Active Problem List   Diagnosis Date Noted   Acute bilateral low back pain without sciatica 01/29/2023   Tick bites 08/23/2022   Chronic hip pain 02/15/2022   Acute pain of right shoulder 02/15/2022   Thrombocytopenia (HCC) 11/10/2019   History of prostate cancer 04/06/2019   Encounter for screening colonoscopy  10/01/2018   Wart 11/28/2017   Medicare annual wellness visit, subsequent 03/23/2016   Paroxysmal atrial fibrillation (HCC) 11/29/2015   History of prostatectomy 11/29/2015   Hyperlipidemia 03/24/2015    PCP: Chestine Spore   REFERRING PROVIDER: Sninisky   REFERRING DIAG: Continuous leakage of urine  Rationale for Evaluation and Treatment Rehabilitation  THERAPY DIAG:  Sacrococcygeal disorders, not elsewhere classified  Other abnormalities of gait and mobility  ONSET DATE:   SUBJECTIVE:  SUBJECTIVE STATEMENT: Urinary Leakage occurs after  climbing off spin bicycle.   Pt was doing good with leakage up to Dec 2023 and going without a pad. In Dec 2023, he had wet his pants . No more big leaks since this episode. Pt started back wearing pads for the year.  Between 4-6 pm in evening, leakage occurs.   Pt has been inconsistent with past pelvic PT HEP , when he remembered to do them, while watching TV. Pt understand that his motivation is key to doing HEP. He states" dry pants" is the motivator   Workout routine:  3-4 x week, spin class,  push ups 100 per day, walking 10K steps. No stretching routine after walking. Pt and wife used to do yoga but the teacher left.     PERTINENT HISTORY:  Pt had prostate surgery 2021.   PAIN:  Are you having pain? No   PRECAUTIONS: No  WEIGHT BEARING RESTRICTIONS: No  FALLS:  Has patient fallen in last 6 months? No  LIVING ENVIRONMENT: Lives with: wife  Lives in: house  Stairs: STE with rail 2 steps to garage, 3 STE back door    OCCUPATION:  retired as Occupational hygienist   PLOF: Independent  PATIENT GOALS:  Stop leaking   OBJECTIVE:                               [facility] PT Assessment - 04/09/23 1429       Palpation   Spinal mobility L shoulder, R pelvic lowered ( post  Tx with proper shoe lift in L toe box and heel)      Ambulation/Gait   Gait Comments 1.26 m/s ( excessive sway to L ) flat insert in L shoe              [facility] Adult PT Treatment/Exercise - 04/09/23 1544       Ambulation/Gait   Gait Comments 1.26 m/s ( excessive sway to L ) flat insert in L shoe      Therapeutic Activites    Therapeutic Activities Other Therapeutic Activities    Other Therapeutic Activities motivational interviewing to understand barriers to noncompliance to HEP,      Neuro Re-ed    Neuro Re-ed Details  cued for fitting three pieces to show lifts in  R shoe, privided email to order more                HOME EXERCISE PROGRAM: See pt instruction section    ASSESSMENT:  CLINICAL IMPRESSION:   Pt is a  73  yo  who presents with urinary leakage which impact QOL, ADL,  fitness, social and community activities. Pt would like to review Pelvic PT HEP and maintain compliance to minimize wetting pants.   Pt's musculoskeletal assessment revealed uneven pelvic girdle and shoulder height, asymmetries to gait pattern, limited spinal /pelvic mobility, and poor compliance to past Pelvic PT HEP     Pt will benefit from coordination training and education on fitness and functional positions in order to gain a more effective intraabdominal pressure system to minimize urinary leakage.  Regional interdependent approaches will yield greater benefits in pt's POC.   Following Tx today, shoe lifts were placed in toe box and heel of R shoe which levelled pelvis and shoulder and increased gait speed.  Plan to review promote optimize IAP system for improved pelvic floor function, trunk stability, gait, balance, stabilization with mobility tasks.  Plan  to review pelvic PT HEP next session. Provided chart for pt to ensure compliance. Co-created strategies to overcome barriers with inconsistency with HEP. Suggested piggy backing on breakfast and lunch to do exercises after meals. Pt  voiced understanding.    Pt benefits from skilled PT.    Pt benefits from skilled PT.    OBJECTIVE IMPAIRMENTS decreased activity tolerance, decreased coordination, decreased endurance, decreased mobility, difficulty walking, decreased ROM, decreased strength, decreased safety awareness, hypomobility, increased muscle spasms, impaired flexibility, improper body mechanics, postural dysfunction,. scar restrictions   ACTIVITY LIMITATIONS  self-care,  home chores, work tasks    PARTICIPATION LIMITATIONS:  community, gym activities    PERSONAL FACTORS    are also affecting patient's functional outcome.    REHAB POTENTIAL: Good   CLINICAL DECISION MAKING: Evolving/moderate complexity   EVALUATION COMPLEXITY: Moderate    PATIENT EDUCATION:    Education details: Showed pt anatomy images. Explained muscles attachments/ connection, physiology of deep core system/ spinal- thoracic-pelvis-lower kinetic chain as they relate to pt's presentation, Sx, and past Hx. Explained what and how these areas of deficits need to be restored to balance and function    See Therapeutic activity / neuromuscular re-education section  Answered pt's questions.   Person educated: Patient Education method: Explanation, Demonstration, Tactile cues, Verbal cues, and Handouts Education comprehension: verbalized understanding, returned demonstration, verbal cues required, tactile cues required, and needs further education     PLAN: PT FREQUENCY: 1x/week   PT DURATION: 10 weeks   PLANNED INTERVENTIONS: Therapeutic exercises, Therapeutic activity, Neuromuscular re-education, Balance training, Gait training, Patient/Family education, Self Care, Joint mobilization, Spinal mobilization, Moist heat, Taping, and Manual therapy, dry needling.   PLAN FOR NEXT SESSION: See clinical impression for plan     GOALS: Goals reviewed with patient? Yes  SHORT TERM GOALS: Target date: 05/07/2023    Pt will demo IND with  HEP                    Baseline: Not IND            Goal status: INITIAL   LONG TERM GOALS: Target date:  06/18/2023   1.Pt will demo proper deep core coordination without chest breathing and optimal excursion of diaphragm/pelvic floor in order to promote spinal stability and pelvic floor function  Baseline: dyscoordination Goal status: INITIAL  2.  Pt will demo > 5 pt change on FOTO  to improve QOL and function  PFDI Urinary baseline -  4 Lower score = better function  Urinary Problem baseline-   62 Higher score = better function    Goal status: INITIAL  3.  Pt will demo proper body mechanics in against gravity tasks and ADLs  work tasks, fitness  to minimize straining pelvic floor / back    Baseline: not IND, improper form that places strain on pelvic floor  Goal status: INITIAL    4. Pt will demo increased gait speed > 1.3 m/s with reciprocal gait pattern, longer stride length  in order to ambulate safely in community and return to fitness routine  Baseline: 1.26 m/s ( excessive sway L pelvis)  Goal status: INITIAL    5. Pt will demo levelled pelvic girdle and shoulder height in order to progress to deep core strengthening HEP and restore mobility at spine, pelvis, gait, posture minimize falls, and improve balance   Baseline: L shoulder, R pelvic lowered  Goal status: INITIAL   6. Pt reports no leakage between 4-6 pm in the  evening across 1 week.  Baseline:  Between 4-6 pm in evening, leakage occurs.  Goal status: INITIAL     Mariane Masters, PT 04/09/2023, 2:45 PM

## 2023-04-18 ENCOUNTER — Ambulatory Visit: Payer: Medicare Other | Admitting: Physical Therapy

## 2023-04-18 DIAGNOSIS — N3945 Continuous leakage: Secondary | ICD-10-CM | POA: Diagnosis not present

## 2023-04-18 DIAGNOSIS — M533 Sacrococcygeal disorders, not elsewhere classified: Secondary | ICD-10-CM

## 2023-04-18 DIAGNOSIS — R2689 Other abnormalities of gait and mobility: Secondary | ICD-10-CM

## 2023-04-18 NOTE — Therapy (Signed)
OUTPATIENT PHYSICAL THERAPY Treatment  Patient Name: Brian Glass MRN: [id number] DOB:[birth year], 73 y.o., male Today's Date: 04/18/2023   PT End of Session - 04/18/23 1555     Visit Number 2    Number of Visits 10    Date for PT Re-Evaluation 06/18/23    PT Start Time 1550    PT Stop Time 1630    PT Time Calculation (min) 40 min    Activity Tolerance Patient tolerated treatment well;No increased pain             Past Medical History:  Diagnosis Date   Allergy    Arthritis    Atrial fibrillation (HCC)    BCC (basal cell carcinoma) 11/08/2021   right medial cheek, Mohs 12/06/21   Bilateral vitreous detachment    Chickenpox    Hx of basal cell carcinoma 2018   L malar cheek   Prostate cancer (HCC) 2020   Typical atrial flutter (HCC)    Past Surgical History:  Procedure Laterality Date   ATRIAL FIBRILLATION ABLATION N/A 05/10/2016   Procedure: Atrial Fibrillation Ablation;  Surgeon: Hillis Range, MD;  Location: [facility] INVASIVE CV LAB;  Service: Cardiovascular;  Laterality: N/A;   CATARACT EXTRACTION, BILATERAL  2016   COLONOSCOPY WITH PROPOFOL N/A 10/20/2018   Procedure: COLONOSCOPY WITH PROPOFOL;  Surgeon: Toney Reil, MD;  Location: [facility] ENDOSCOPY;  Service: Gastroenterology;  Laterality: N/A;   EYE SURGERY     MOHS SURGERY Left 2018   malar cheek, BCC   MOHS SURGERY Right    cheek 2023   ROBOT ASSISTED LAPAROSCOPIC RADICAL PROSTATECTOMY N/A 04/06/2019   Procedure: XI ROBOTIC ASSISTED LAPAROSCOPIC RADICAL PROSTATECTOMY;  Surgeon: Sondra Come, MD;  Location: [facility] ORS;  Service: Urology;  Laterality: N/A;   TONSILLECTOMY  1955   Patient Active Problem List   Diagnosis Date Noted   Acute bilateral low back pain without sciatica 01/29/2023   Tick bites 08/23/2022   Chronic hip pain 02/15/2022   Acute pain of right shoulder 02/15/2022   Thrombocytopenia (HCC) 11/10/2019   History of prostate cancer 04/06/2019   Encounter for screening colonoscopy  10/01/2018   Wart 11/28/2017   Medicare annual wellness visit, subsequent 03/23/2016   Paroxysmal atrial fibrillation (HCC) 11/29/2015   History of prostatectomy 11/29/2015   Hyperlipidemia 03/24/2015    PCP: Chestine Spore   REFERRING PROVIDER: Sninisky   REFERRING DIAG: Continuous leakage of urine  Rationale for Evaluation and Treatment Rehabilitation  THERAPY DIAG:  Sacrococcygeal disorders, not elsewhere classified  Other abnormalities of gait and mobility  ONSET DATE:   SUBJECTIVE:        SUBJECTIVE STATEMENT TODAY:               Pt did his kegel routine but did 10 quick contractions back to back. He noticed when he did them upon waking, he fell back asleep  SUBJECTIVE STATEMENT ON EVAL 04/13/23 : Urinary Leakage occurs after  climbing off spin bicycle.   Pt was doing good with leakage up to Dec 2023 and going without a pad. In Dec 2023, he had wet his pants . No more big leaks since this episode. Pt started back wearing pads for the year.  Between 4-6 pm in evening, leakage occurs.   Pt has been inconsistent with past pelvic PT HEP , when he remembered to do them, while watching TV. Pt understand that his motivation is key to doing HEP. He states" dry pants" is the motivator   Workout routine:  3-4 x week, spin class,  push ups 100 per day, walking 10K steps. No stretching routine after walking. Pt and wife used to do yoga but the teacher left.     PERTINENT HISTORY:  Pt had prostate surgery 2021.   PAIN:  Are you having pain? No   PRECAUTIONS: No  WEIGHT BEARING RESTRICTIONS: No  FALLS:  Has patient fallen in last 6 months? No  LIVING ENVIRONMENT: Lives with: wife  Lives in: house  Stairs: STE with rail 2 steps to garage, 3 STE back door    OCCUPATION:  retired as Occupational hygienist   PLOF: Independent  PATIENT GOALS:  Stop leaking    OBJECTIVE:                   Pelvic Floor Special Questions - 04/19/23 1738     External Perineal Exam through clothing: long holds 4 sec holds, 3 reps,  cued for 2 proper rest breaks between long holds              [facility] Adult PT Treatment/Exercise - 04/19/23 1736       Self-Care   Self-Care Other Self-Care Comments    Other Self-Care Comments  motivational interviewing to enhance compliance in his daily schedule,  explained regimen with endurance contractions of kegels to promote continence, corrected pt as pt was performing incorrectly kegels      Therapeutic Activites    Other Therapeutic Activities explained two types of kegels and guided pt on rest breaks between endurance contractions      Neuro Re-ed    Neuro Re-ed Details  cued for propiception for optimal pelvic floor activation , guided sequence for long holds pelvic floor and deep core               HOME EXERCISE PROGRAM: See pt instruction section    ASSESSMENT:  CLINICAL IMPRESSION:    Provided motivational interviewing to enhance compliance in his daily schedule,  explained regimen with endurance contractions of kegels to promote continence, corrected pt as pt was performing incorrectly kegels. Cued for propiception for optimal pelvic floor activation.  Guided sequence for long holds pelvic floor and deep core level 1-2 HEP. Pt demo'd correctly post training. Progress to quick contractions next session   Regional interdependent approaches will yield greater benefits in pt's POC and leg length difference.    Provided chart for pt to ensure compliance. Co-created strategies to overcome barriers with inconsistency with HEP. Plan to review  HEP promote optimize IAP system for improved pelvic floor function, trunk stability, gait, balance, stabilization with mobility tasks.   Pt benefits from skilled PT.      OBJECTIVE IMPAIRMENTS decreased activity tolerance, decreased coordination, decreased  endurance, decreased mobility, difficulty walking, decreased ROM, decreased strength, decreased safety awareness, hypomobility, increased muscle spasms, impaired flexibility, improper body mechanics, postural dysfunction,. scar restrictions  ACTIVITY LIMITATIONS  self-care,  home chores, work tasks    PARTICIPATION LIMITATIONS:  community, gym activities    PERSONAL FACTORS    are also affecting patient's functional outcome.    REHAB POTENTIAL: Good   CLINICAL DECISION MAKING: Evolving/moderate complexity   EVALUATION COMPLEXITY: Moderate    PATIENT EDUCATION:    Education details: Showed pt anatomy images. Explained muscles attachments/ connection, physiology of deep core system/ spinal- thoracic-pelvis-lower kinetic chain as they relate to pt's presentation, Sx, and past Hx. Explained what and how these areas of deficits need to be restored to balance and function    See Therapeutic activity / neuromuscular re-education section  Answered pt's questions.   Person educated: Patient Education method: Explanation, Demonstration, Tactile cues, Verbal cues, and Handouts Education comprehension: verbalized understanding, returned demonstration, verbal cues required, tactile cues required, and needs further education     PLAN: PT FREQUENCY: 1x/week   PT DURATION: 10 weeks   PLANNED INTERVENTIONS: Therapeutic exercises, Therapeutic activity, Neuromuscular re-education, Balance training, Gait training, Patient/Family education, Self Care, Joint mobilization, Spinal mobilization, Moist heat, Taping, and Manual therapy, dry needling.   PLAN FOR NEXT SESSION: See clinical impression for plan     GOALS: Goals reviewed with patient? Yes  SHORT TERM GOALS: Target date: 05/07/2023    Pt will demo IND with HEP                    Baseline: Not IND            Goal status: INITIAL   LONG TERM GOALS: Target date:  06/18/2023   1.Pt will demo proper deep core coordination without chest  breathing and optimal excursion of diaphragm/pelvic floor in order to promote spinal stability and pelvic floor function  Baseline: dyscoordination Goal status: INITIAL  2.  Pt will demo > 5 pt change on FOTO  to improve QOL and function  PFDI Urinary baseline -  4 Lower score = better function  Urinary Problem baseline-   62 Higher score = better function    Goal status: INITIAL  3.  Pt will demo proper body mechanics in against gravity tasks and ADLs  work tasks, fitness  to minimize straining pelvic floor / back    Baseline: not IND, improper form that places strain on pelvic floor  Goal status: INITIAL    4. Pt will demo increased gait speed > 1.3 m/s with reciprocal gait pattern, longer stride length  in order to ambulate safely in community and return to fitness routine  Baseline: 1.26 m/s ( excessive sway L pelvis)  Goal status: INITIAL    5. Pt will demo levelled pelvic girdle and shoulder height in order to progress to deep core strengthening HEP and restore mobility at spine, pelvis, gait, posture minimize falls, and improve balance   Baseline: L shoulder, R pelvic lowered  Goal status: INITIAL   6. Pt reports no leakage between 4-6 pm in the evening across 1 week.  Baseline:  Between 4-6 pm in evening, leakage occurs.  Goal status: INITIAL     Mariane Masters, PT 04/18/2023, 3:56 PM

## 2023-04-25 ENCOUNTER — Ambulatory Visit: Payer: Medicare Other | Attending: Urology | Admitting: Physical Therapy

## 2023-04-25 DIAGNOSIS — R2689 Other abnormalities of gait and mobility: Secondary | ICD-10-CM | POA: Diagnosis not present

## 2023-04-25 DIAGNOSIS — M25551 Pain in right hip: Secondary | ICD-10-CM | POA: Insufficient documentation

## 2023-04-25 DIAGNOSIS — M533 Sacrococcygeal disorders, not elsewhere classified: Secondary | ICD-10-CM | POA: Diagnosis not present

## 2023-04-25 DIAGNOSIS — M25552 Pain in left hip: Secondary | ICD-10-CM | POA: Insufficient documentation

## 2023-04-25 NOTE — Therapy (Signed)
 OUTPATIENT PHYSICAL THERAPY Treatment  Patient Name: Brian Glass MRN: [id number] DOB:1950-08-09, 73 y.o., male Today's Date: 04/25/2023   PT End of Session - 04/25/23 1725     Visit Number 3    Number of Visits 10    Date for PT Re-Evaluation 06/18/23    PT Start Time 1415    PT Stop Time 1500    PT Time Calculation (min) 45 min    Activity Tolerance Patient tolerated treatment well;No increased pain             Past Medical History:  Diagnosis Date   Allergy    Arthritis    Atrial fibrillation (HCC)    BCC (basal cell carcinoma) 11/08/2021   right medial cheek, Mohs 12/06/21   Bilateral vitreous detachment    Chickenpox    Hx of basal cell carcinoma 2018   L malar cheek   Prostate cancer (HCC) 2020   Typical atrial flutter (HCC)    Past Surgical History:  Procedure Laterality Date   ATRIAL FIBRILLATION ABLATION N/A 05/10/2016   Procedure: Atrial Fibrillation Ablation;  Surgeon: Lynwood Rakers, MD;  Location: [facility] INVASIVE CV LAB;  Service: Cardiovascular;  Laterality: N/A;   CATARACT EXTRACTION, BILATERAL  2016   COLONOSCOPY WITH PROPOFOL  N/A 10/20/2018   Procedure: COLONOSCOPY WITH PROPOFOL ;  Surgeon: Unk Corinn Skiff, MD;  Location: [facility] ENDOSCOPY;  Service: Gastroenterology;  Laterality: N/A;   EYE SURGERY     MOHS SURGERY Left 2018   malar cheek, BCC   MOHS SURGERY Right    cheek 2023   ROBOT ASSISTED LAPAROSCOPIC RADICAL PROSTATECTOMY N/A 04/06/2019   Procedure: XI ROBOTIC ASSISTED LAPAROSCOPIC RADICAL PROSTATECTOMY;  Surgeon: Francisca Redell BROCKS, MD;  Location: [facility] ORS;  Service: Urology;  Laterality: N/A;   TONSILLECTOMY  1955   Patient Active Problem List   Diagnosis Date Noted   Acute bilateral low back pain without sciatica 01/29/2023   Tick bites 08/23/2022   Chronic hip pain 02/15/2022   Acute pain of right shoulder 02/15/2022   Thrombocytopenia (HCC) 11/10/2019   History of prostate cancer 04/06/2019   Encounter for screening colonoscopy  10/01/2018   Wart 11/28/2017   Medicare annual wellness visit, subsequent 03/23/2016   Paroxysmal atrial fibrillation (HCC) 11/29/2015   History of prostatectomy 11/29/2015   Hyperlipidemia 03/24/2015    PCP: Gretta   REFERRING PROVIDER: Sninisky   REFERRING DIAG: Continuous leakage of urine  Rationale for Evaluation and Treatment Rehabilitation  THERAPY DIAG:  Sacrococcygeal disorders, not elsewhere classified  Other abnormalities of gait and mobility  ONSET DATE:   SUBJECTIVE:        SUBJECTIVE STATEMENT TODAY:               Pt did his kegel routine but did 10 quick contractions back to back. He noticed when he did them upon waking, he fell back asleep  SUBJECTIVE STATEMENT ON EVAL 04/13/23 : Urinary Leakage occurs after  climbing off spin bicycle.   Pt was doing good with leakage up to Dec 2023 and going without a pad. In Dec 2023, he had wet his pants . No more big leaks since this episode. Pt started back wearing pads for the year.  Between 4-6 pm in evening, leakage occurs.   Pt has been inconsistent with past pelvic PT HEP , when he remembered to do them, while watching TV. Pt understand that his motivation is key to doing HEP. He states dry pants is the motivator   Workout routine:  3-4 x week, spin class,  push ups 100 per day, walking 10K steps. No stretching routine after walking. Pt and wife used to do yoga but the teacher left.     PERTINENT HISTORY:  Pt had prostate surgery 2021.   PAIN:  Are you having pain? No   PRECAUTIONS: No  WEIGHT BEARING RESTRICTIONS: No  FALLS:  Has patient fallen in last 6 months? No  LIVING ENVIRONMENT: Lives with: wife  Lives in: house  Stairs: STE with rail 2 steps to garage, 3 STE back door    OCCUPATION:  retired as occupational hygienist   PLOF: Independent  PATIENT GOALS:  Stop leaking    OBJECTIVE:                   Pelvic Floor Special Questions - 04/25/23 1725     External Perineal Exam ab overuse with deep core / able to progress to 4 sec, 4 reps from 3 reps today              [facility] Adult PT Treatment/Exercise - 04/25/23 1726       Therapeutic Activites    Other Therapeutic Activities practiced 3 reps with proper mounting. dismounting spin bike and with sequence with use of BUE with lifting leg to  minimize leakage and minimize risk of injuries      Neuro Re-ed    Neuro Re-ed Details  cued for less ab overuse, guided kegel and deep core HEP               HOME EXERCISE PROGRAM: See pt instruction section    ASSESSMENT:  CLINICAL IMPRESSION:   Shoe lift  continues to help level pelvic alignment.  Today Cued for less ab overuse with deep core HEP. Pt was able to progress to 4 sec, 4 reps from 3 reps today.  Assessed mounting and dismounting from spin bike which is when he notices leakage. Pt demo'd poor alignment. Practiced 3 reps with proper mounting. dismounting spin bike and with sequence with use of BUE with lifting leg for stretch to  minimize leakage and minimize risk of injuries    Regional interdependent approaches will yield greater benefits in pt's POC and leg length difference.   Provided chart for pt to ensure compliance. Co-created strategies to overcome barriers with inconsistency with HEP. Plan to review  HEP promote optimize IAP system for improved pelvic floor function, trunk stability, gait, balance, stabilization with mobility tasks.   Pt benefits from skilled PT.      OBJECTIVE IMPAIRMENTS decreased activity tolerance, decreased coordination, decreased endurance, decreased mobility, difficulty walking, decreased ROM, decreased strength, decreased safety awareness, hypomobility, increased muscle spasms, impaired flexibility, improper body mechanics, postural dysfunction,. scar restrictions   ACTIVITY LIMITATIONS  self-care,   home chores, work tasks    PARTICIPATION LIMITATIONS:  community, gym activities    PERSONAL FACTORS  are also affecting patient's functional outcome.    REHAB POTENTIAL: Good   CLINICAL DECISION MAKING: Evolving/moderate complexity   EVALUATION COMPLEXITY: Moderate    PATIENT EDUCATION:    Education details: Showed pt anatomy images. Explained muscles attachments/ connection, physiology of deep core system/ spinal- thoracic-pelvis-lower kinetic chain as they relate to pt's presentation, Sx, and past Hx. Explained what and how these areas of deficits need to be restored to balance and function    See Therapeutic activity / neuromuscular re-education section  Answered pt's questions.   Person educated: Patient Education method: Explanation, Demonstration, Tactile cues, Verbal cues, and Handouts Education comprehension: verbalized understanding, returned demonstration, verbal cues required, tactile cues required, and needs further education     PLAN: PT FREQUENCY: 1x/week   PT DURATION: 10 weeks   PLANNED INTERVENTIONS: Therapeutic exercises, Therapeutic activity, Neuromuscular re-education, Balance training, Gait training, Patient/Family education, Self Care, Joint mobilization, Spinal mobilization, Moist heat, Taping, and Manual therapy, dry needling.   PLAN FOR NEXT SESSION: See clinical impression for plan     GOALS: Goals reviewed with patient? Yes  SHORT TERM GOALS: Target date: 05/07/2023    Pt will demo IND with HEP                    Baseline: Not IND            Goal status: INITIAL   LONG TERM GOALS: Target date:  06/18/2023   1.Pt will demo proper deep core coordination without chest breathing and optimal excursion of diaphragm/pelvic floor in order to promote spinal stability and pelvic floor function  Baseline: dyscoordination Goal status: INITIAL  2.  Pt will demo > 5 pt change on FOTO  to improve QOL and function  PFDI Urinary baseline -  4 Lower  score = better function  Urinary Problem baseline-   62 Higher score = better function    Goal status: INITIAL  3.  Pt will demo proper body mechanics in against gravity tasks and ADLs  work tasks, fitness  to minimize straining pelvic floor / back    Baseline: not IND, improper form that places strain on pelvic floor  Goal status: INITIAL    4. Pt will demo increased gait speed > 1.3 m/s with reciprocal gait pattern, longer stride length  in order to ambulate safely in community and return to fitness routine  Baseline: 1.26 m/s ( excessive sway L pelvis)  Goal status: INITIAL    5. Pt will demo levelled pelvic girdle and shoulder height in order to progress to deep core strengthening HEP and restore mobility at spine, pelvis, gait, posture minimize falls, and improve balance   Baseline: L shoulder, R pelvic lowered  Goal status: INITIAL   6. Pt reports no leakage between 4-6 pm in the evening across 1 week.  Baseline:  Between 4-6 pm in evening, leakage occurs.  Goal status: INITIAL     Pia Lupe Plump, PT 04/25/2023, 5:29 PM

## 2023-05-02 ENCOUNTER — Ambulatory Visit: Payer: Medicare Other | Admitting: Physical Therapy

## 2023-05-02 DIAGNOSIS — M25551 Pain in right hip: Secondary | ICD-10-CM

## 2023-05-02 DIAGNOSIS — M533 Sacrococcygeal disorders, not elsewhere classified: Secondary | ICD-10-CM | POA: Diagnosis not present

## 2023-05-02 DIAGNOSIS — M25552 Pain in left hip: Secondary | ICD-10-CM | POA: Diagnosis not present

## 2023-05-02 DIAGNOSIS — R2689 Other abnormalities of gait and mobility: Secondary | ICD-10-CM | POA: Diagnosis not present

## 2023-05-02 NOTE — Therapy (Unsigned)
OUTPATIENT PHYSICAL THERAPY Treatment  Patient Name: Brian Glass MRN: [id number] DOB:1951-01-27, 73 y.o., male Today's Date: 05/02/2023   PT End of Session - 05/02/23 1605     Visit Number 4    Number of Visits 10    Date for PT Re-Evaluation 06/18/23    PT Start Time 1545    PT Stop Time 1630    PT Time Calculation (min) 45 min    Activity Tolerance Patient tolerated treatment well;No increased pain             Past Medical History:  Diagnosis Date   Allergy    Arthritis    Atrial fibrillation (HCC)    BCC (basal cell carcinoma) 11/08/2021   right medial cheek, Mohs 12/06/21   Bilateral vitreous detachment    Chickenpox    Hx of basal cell carcinoma 2018   L malar cheek   Prostate cancer (HCC) 2020   Typical atrial flutter (HCC)    Past Surgical History:  Procedure Laterality Date   ATRIAL FIBRILLATION ABLATION N/A 05/10/2016   Procedure: Atrial Fibrillation Ablation;  Surgeon: Hillis Range, MD;  Location: [facility] INVASIVE CV LAB;  Service: Cardiovascular;  Laterality: N/A;   CATARACT EXTRACTION, BILATERAL  2016   COLONOSCOPY WITH PROPOFOL N/A 10/20/2018   Procedure: COLONOSCOPY WITH PROPOFOL;  Surgeon: Toney Reil, MD;  Location: [facility] ENDOSCOPY;  Service: Gastroenterology;  Laterality: N/A;   EYE SURGERY     MOHS SURGERY Left 2018   malar cheek, BCC   MOHS SURGERY Right    cheek 2023   ROBOT ASSISTED LAPAROSCOPIC RADICAL PROSTATECTOMY N/A 04/06/2019   Procedure: XI ROBOTIC ASSISTED LAPAROSCOPIC RADICAL PROSTATECTOMY;  Surgeon: Sondra Come, MD;  Location: [facility] ORS;  Service: Urology;  Laterality: N/A;   TONSILLECTOMY  1955   Patient Active Problem List   Diagnosis Date Noted   Acute bilateral low back pain without sciatica 01/29/2023   Tick bites 08/23/2022   Chronic hip pain 02/15/2022   Acute pain of right shoulder 02/15/2022   Thrombocytopenia (HCC) 11/10/2019   History of prostate cancer 04/06/2019   Encounter for screening colonoscopy  10/01/2018   Wart 11/28/2017   Medicare annual wellness visit, subsequent 03/23/2016   Paroxysmal atrial fibrillation (HCC) 11/29/2015   History of prostatectomy 11/29/2015   Hyperlipidemia 03/24/2015    PCP: [facility]   REFERRING PROVIDER: Sninisky   REFERRING DIAG: Continuous leakage of urine  Rationale for Evaluation and Treatment Rehabilitation  THERAPY DIAG:  Sacrococcygeal disorders, not elsewhere classified  Other abnormalities of gait and mobility  Pain in left hip  Pain in right hip  ONSET DATE:   SUBJECTIVE:        SUBJECTIVE STATEMENT TODAY:               Pt feels he is not sure when he has leaked versus when he had the urge to go but leaked                Pt tried the new technique with dismounting the bike and he noticed less leakage.                 Pt practiced the 4 sec kegels 2 x day total of 6 reps/   SUBJECTIVE STATEMENT ON EVAL 04/13/23 : Urinary Leakage occurs after  climbing off spin bicycle.   Pt was doing good with leakage up to Dec 2023 and going without a pad. In Dec 2023, he had wet his pants . No more big  leaks since this episode. Pt started back wearing pads for the year.  Between 4-6 pm in evening, leakage occurs.   Pt has been inconsistent with past pelvic PT HEP , when he remembered to do them, while watching TV. Pt understand that his motivation is key to doing HEP. He states" dry pants" is the motivator   Workout routine:  3-4 x week, spin class,  push ups 100 per day, walking 10K steps. No stretching routine after walking. Pt and wife used to do yoga but the teacher left.     PERTINENT HISTORY:  Pt had prostate surgery 2021.   PAIN:  Are you having pain? No   PRECAUTIONS: No  WEIGHT BEARING RESTRICTIONS: No  FALLS:  Has patient fallen in last 6 months? No  LIVING ENVIRONMENT: Lives with: wife  Lives in: house  Stairs: STE with rail 2 steps to garage, 3 STE back door    OCCUPATION:  retired as Occupational hygienist   PLOF:  Independent  PATIENT GOALS:  Stop leaking   OBJECTIVE:                       HOME EXERCISE PROGRAM: See pt instruction section    ASSESSMENT:  CLINICAL IMPRESSION:   Improvements:    Pt tried the new technique with dismounting the bike and he noticed less leakage.  Shoe lift  continues to help level pelvic alignment.    Regional interdependent approaches will yield greater benefits in pt's POC and leg length difference.   Provided chart for pt to ensure compliance. Co-created strategies to overcome barriers with inconsistency with HEP. Plan to review  HEP promote optimize IAP system for improved pelvic floor function, trunk stability, gait, balance, stabilization with mobility tasks.   Pt benefits from skilled PT.      OBJECTIVE IMPAIRMENTS decreased activity tolerance, decreased coordination, decreased endurance, decreased mobility, difficulty walking, decreased ROM, decreased strength, decreased safety awareness, hypomobility, increased muscle spasms, impaired flexibility, improper body mechanics, postural dysfunction,. scar restrictions   ACTIVITY LIMITATIONS  self-care,  home chores, work tasks    PARTICIPATION LIMITATIONS:  community, gym activities    PERSONAL FACTORS    are also affecting patient's functional outcome.    REHAB POTENTIAL: Good   CLINICAL DECISION MAKING: Evolving/moderate complexity   EVALUATION COMPLEXITY: Moderate    PATIENT EDUCATION:    Education details: Showed pt anatomy images. Explained muscles attachments/ connection, physiology of deep core system/ spinal- thoracic-pelvis-lower kinetic chain as they relate to pt's presentation, Sx, and past Hx. Explained what and how these areas of deficits need to be restored to balance and function    See Therapeutic activity / neuromuscular re-education section  Answered pt's questions.   Person educated: Patient Education method: Explanation, Demonstration, Tactile cues, Verbal cues,  and Handouts Education comprehension: verbalized understanding, returned demonstration, verbal cues required, tactile cues required, and needs further education     PLAN: PT FREQUENCY: 1x/week   PT DURATION: 10 weeks   PLANNED INTERVENTIONS: Therapeutic exercises, Therapeutic activity, Neuromuscular re-education, Balance training, Gait training, Patient/Family education, Self Care, Joint mobilization, Spinal mobilization, Moist heat, Taping, and Manual therapy, dry needling.   PLAN FOR NEXT SESSION: See clinical impression for plan     GOALS: Goals reviewed with patient? Yes  SHORT TERM GOALS: Target date: 05/07/2023    Pt will demo IND with HEP                    Baseline: Not  IND            Goal status: INITIAL   LONG TERM GOALS: Target date:  06/18/2023   1.Pt will demo proper deep core coordination without chest breathing and optimal excursion of diaphragm/pelvic floor in order to promote spinal stability and pelvic floor function  Baseline: dyscoordination Goal status: INITIAL  2.  Pt will demo > 5 pt change on FOTO  to improve QOL and function  PFDI Urinary baseline -  4 Lower score = better function  Urinary Problem baseline-   62 Higher score = better function    Goal status: INITIAL  3.  Pt will demo proper body mechanics in against gravity tasks and ADLs  work tasks, fitness  to minimize straining pelvic floor / back    Baseline: not IND, improper form that places strain on pelvic floor  Goal status: INITIAL    4. Pt will demo increased gait speed > 1.3 m/s with reciprocal gait pattern, longer stride length  in order to ambulate safely in community and return to fitness routine  Baseline: 1.26 m/s ( excessive sway L pelvis)  Goal status: INITIAL    5. Pt will demo levelled pelvic girdle and shoulder height in order to progress to deep core strengthening HEP and restore mobility at spine, pelvis, gait, posture minimize falls, and improve balance    Baseline: L shoulder, R pelvic lowered  Goal status: INITIAL   6. Pt reports no leakage between 4-6 pm in the evening across 1 week.  Baseline:  Between 4-6 pm in evening, leakage occurs.  Goal status: INITIAL     Mariane Masters, PT 05/02/2023, 4:07 PM

## 2023-05-03 NOTE — Patient Instructions (Signed)
Add 3 quick reps in seated at meal times

## 2023-05-08 ENCOUNTER — Other Ambulatory Visit: Payer: Self-pay | Admitting: Primary Care

## 2023-05-08 DIAGNOSIS — E785 Hyperlipidemia, unspecified: Secondary | ICD-10-CM

## 2023-05-08 NOTE — Telephone Encounter (Signed)
 Patient is due for annual follow up in April, this will be required prior to any further refills.  Please schedule, thank you!

## 2023-05-08 NOTE — Telephone Encounter (Signed)
 [email or web address]

## 2023-05-08 NOTE — Telephone Encounter (Signed)
 Spoke to pt, scheduled cpe for 06/06/23

## 2023-05-09 ENCOUNTER — Ambulatory Visit: Payer: Medicare Other | Admitting: Physical Therapy

## 2023-05-29 ENCOUNTER — Ambulatory Visit: Payer: Medicare Other

## 2023-05-29 VITALS — Ht 70.0 in | Wt 168.0 lb

## 2023-05-29 DIAGNOSIS — Z0000 Encounter for general adult medical examination without abnormal findings: Secondary | ICD-10-CM | POA: Diagnosis not present

## 2023-05-29 NOTE — Progress Notes (Signed)
 Please attest and cosign this visit due to patients primary care provider not being in the office at the time the visit was completed.    Subjective:   Brian Glass is a 73 y.o. who presents for a Medicare Wellness preventive visit.  Visit Complete: Virtual I connected with  Brian Glass on 05/29/23 by a audio enabled telemedicine application and verified that I am speaking with the correct person using two identifiers.  Patient Location: Home  Provider Location: Office/Clinic  I discussed the limitations of evaluation and management by telemedicine. The patient expressed understanding and agreed to proceed.  Vital Signs: Because this visit was a virtual/telehealth visit, some criteria may be missing or patient reported. Any vitals not documented were not able to be obtained and vitals that have been documented are patient reported.  VideoDeclined- This patient declined Librarian, academic. Therefore the visit was completed with audio only.  AWV Questionnaire: Yes: Patient Medicare AWV questionnaire was completed by the patient on 05/28/23; I have confirmed that all information answered by patient is correct and no changes since this date.  Cardiac Risk Factors include: advanced age (>64men, >62 women);dyslipidemia;male gender    Objective:    Today's Vitals   05/28/23 1638 05/29/23 1503  Weight:  168 lb (76.2 kg)  Height:  5\' 10"  (1.778 m)  PainSc: 4     Body mass index is 24.11 kg/m.     05/29/2023    3:12 PM 04/09/2023    2:28 PM 05/24/2022   10:59 AM 03/22/2022    1:12 PM 05/22/2021   11:58 AM 01/18/2021   11:13 AM 04/06/2019    6:15 AM  Advanced Directives  Does Patient Have a Medical Advance Directive? Yes Yes Yes Yes Yes Yes Yes  Type of Estate agent of [place];Living will  Healthcare Power of [place];Living will  Healthcare Power of [place];Living will  Healthcare Power of Attorney  Does patient want to make changes  to medical advance directive?   No - Patient declined No - Patient declined Yes (MAU/Ambulatory/Procedural Areas - Information given)    Copy of Healthcare Power of Attorney in Chart? Yes - validated most recent copy scanned in chart (See row information)  Yes - validated most recent copy scanned in chart (See row information)    No - copy requested    Current Medications (verified) Outpatient Encounter Medications as of 05/29/2023  Medication Sig   Calcium Polycarbophil (EQ FIBER LAXATIVE PO) Take 1 capsule by mouth daily.    cyclobenzaprine (FLEXERIL) 5 MG tablet Take 1 tablet (5 mg total) by mouth at bedtime as needed for muscle spasms.   fluticasone (FLONASE) 50 MCG/ACT nasal spray Place 1 spray into both nostrils daily as needed for allergies.    Multiple Vitamins-Minerals (SENIOR MULTIVITAMIN PLUS PO) Take 1 tablet by mouth daily.   Omega-3 Fatty Acids (FISH OIL PO) Take 1,300 mg by mouth daily.   rosuvastatin (CRESTOR) 5 MG tablet Take 1 tablet (5 mg total) by mouth daily. for cholesterol.   sildenafil (VIAGRA) 100 MG tablet Take 1 tablet (100 mg total) by mouth daily as needed for erectile dysfunction.   No facility-administered encounter medications on file as of 05/29/2023.    Allergies (verified) Patient has no known allergies.   History: Past Medical History:  Diagnosis Date   Allergy    Arthritis    Atrial fibrillation (HCC)    BCC (basal cell carcinoma) 11/08/2021   right medial cheek, Mohs 12/06/21  Bilateral vitreous detachment    Chickenpox    Hx of basal cell carcinoma 2018   L malar cheek   Prostate cancer (HCC) 2020   Typical atrial flutter (HCC)    Past Surgical History:  Procedure Laterality Date   ATRIAL FIBRILLATION ABLATION N/A 05/10/2016   Procedure: Atrial Fibrillation Ablation;  Surgeon: Hillis Range, MD;  Location: [facility] INVASIVE CV LAB;  Service: Cardiovascular;  Laterality: N/A;   CATARACT EXTRACTION, BILATERAL  2016   COLONOSCOPY WITH PROPOFOL N/A  10/20/2018   Procedure: COLONOSCOPY WITH PROPOFOL;  Surgeon: Toney Reil, MD;  Location: [facility] ENDOSCOPY;  Service: Gastroenterology;  Laterality: N/A;   EYE SURGERY     MOHS SURGERY Left 2018   malar cheek, BCC   MOHS SURGERY Right    cheek 2023   ROBOT ASSISTED LAPAROSCOPIC RADICAL PROSTATECTOMY N/A 04/06/2019   Procedure: XI ROBOTIC ASSISTED LAPAROSCOPIC RADICAL PROSTATECTOMY;  Surgeon: Sondra Come, MD;  Location: [facility] ORS;  Service: Urology;  Laterality: N/A;   TONSILLECTOMY  1955   Family History  Problem Relation Age of Onset   Hyperlipidemia Mother    Heart disease Mother    Hypertension Mother    Hyperlipidemia Father    Heart disease Father    Stroke Father    Hypertension Father    Heart attack Father    Prostate cancer Brother    Atrial fibrillation Brother    Sleep apnea Brother    CAD Brother    Social History   Socioeconomic History   Marital status: Married    Spouse name: Brian Glass   Number of children: Not on file   Years of education: Not on file   Highest education level: Master's degree (e.g., MA, MS, MEng, MEd, MSW, MBA)  Occupational History   Not on file  Tobacco Use   Smoking status: Never    Passive exposure: Never   Smokeless tobacco: Former    Types: Chew    Quit date: 1970  Vaping Use   Vaping status: Never Used  Substance and Sexual Activity   Alcohol use: Yes    Alcohol/week: 2.0 standard drinks of alcohol    Types: 2 Cans of beer per week    Comment: social   Drug use: No   Sexual activity: Yes    Birth control/protection: None  Other Topics Concern   Not on file  Social History Narrative   Married.   3 children. 2 grandchildren.   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   Enjoys biking, bird watching.   Social Drivers of Corporate investment banker Strain: Low Risk  (05/29/2023)   Overall Financial Resource Strain (CARDIA)    Difficulty of Paying Living Expenses: Not hard at all  Food Insecurity: No Food Insecurity  (05/29/2023)   Hunger Vital Sign    Worried About Running Out of Food in the Last Year: Never true    Ran Out of Food in the Last Year: Never true  Transportation Needs: No Transportation Needs (05/29/2023)   PRAPARE - Administrator, Civil Service (Medical): No    Lack of Transportation (Non-Medical): No  Physical Activity: Sufficiently Active (05/29/2023)   Exercise Vital Sign    Days of Exercise per Week: 4 days    Minutes of Exercise per Session: 50 min  Stress: No Stress Concern Present (05/29/2023)   [employer] of Occupational Health - Occupational Stress Questionnaire    Feeling of Stress : Not at all  Social Connections: Moderately Integrated (  05/29/2023)   Social Connection and Isolation Panel [NHANES]    Frequency of Communication with Friends and Family: Once a week    Frequency of Social Gatherings with Friends and Family: Three times a week    Attends Religious Services: Never    Active Member of Clubs or Organizations: Yes    Attends Engineer, structural: More than 4 times per year    Marital Status: Married    Tobacco Counseling Counseling given: Not Answered  Clinical Intake:  Pre-visit preparation completed: Yes  Pain : 0-10 Pain Score: 4  Pain Type: Chronic pain Pain Location: Back Pain Orientation: Lower Pain Descriptors / Indicators: Aching Pain Onset: In the past 7 days Pain Frequency: Intermittent Pain Relieving Factors: heat pad;IBU  Pain Relieving Factors: heat pad;IBU  BMI - recorded: 24.11 Nutritional Status: BMI of 19-24  Normal Nutritional Risks: None Diabetes: No  How often do you need to have someone help you when you read instructions, pamphlets, or other written materials from your doctor or pharmacy?: 1 - Never  Interpreter Needed?: No  Comments: lives with wife Information entered by :: B.Sylvania Moss,LPN   Activities of Daily Living     05/28/2023    4:38 PM  In your present state of health, do you have  any difficulty performing the following activities:  Hearing? 0  Vision? 0  Difficulty concentrating or making decisions? 0  Walking or climbing stairs? 0  Dressing or bathing? 0  Doing errands, shopping? 0  Preparing Food and eating ? N  Using the Toilet? N  In the past six months, have you accidently leaked urine? Y  Do you have problems with loss of bowel control? N  Managing your Medications? N  Managing your Finances? N  Housekeeping or managing your Housekeeping? N    Patient Care Team: Doreene Nest, NP as PCP - General (Internal Medicine)  Indicate any recent Medical Services you may have received from other than [facility] providers in the past year (date may be approximate).     Assessment:   This is a routine wellness examination for [place].  Hearing/Vision screen Hearing Screening - Comments:: Pt says his hearing is good with hearing aids Vision Screening - Comments:: Pt says his vision is good:only readers [facility]   Goals Addressed             This Visit's Progress    Patient Stated       05/29/23-Would like to drink more water      Patient Stated       05/29/23-Stay healthy.       Depression Screen     05/29/2023    3:10 PM 08/23/2022   10:43 AM 06/22/2022   11:35 AM 05/24/2022   10:58 AM 05/22/2021   12:00 PM 03/22/2021    2:44 PM 10/01/2018    2:55 PM  PHQ 2/9 Scores  PHQ - 2 Score 0 0 0 0 0 0 0  PHQ- 9 Score      0     Fall Risk     05/28/2023    4:38 PM 08/23/2022   10:40 AM 06/22/2022   11:35 AM 05/24/2022   10:53 AM 05/20/2022   11:04 AM  Fall Risk   Falls in the past year? 0 0 0 0 0  Number falls in past yr: 0 0 0 0 0  Injury with Fall? 0 0 0 0 0  Risk for fall due to : No Fall Risks No  Fall Risks No Fall Risks No Fall Risks   Follow up Education provided;Falls prevention discussed Falls evaluation completed Falls evaluation completed Falls prevention discussed;Falls evaluation completed     MEDICARE RISK AT HOME:   Medicare Risk at Home Any stairs in or around the home?: (Patient-Rptd) Yes If so, are there any without handrails?: (Patient-Rptd) Yes Home free of loose throw rugs in walkways, pet beds, electrical cords, etc?: (Patient-Rptd) Yes Adequate lighting in your home to reduce risk of falls?: (Patient-Rptd) Yes Life alert?: (Patient-Rptd) No Use of a cane, walker or w/c?: (Patient-Rptd) No Grab bars in the bathroom?: (Patient-Rptd) Yes Shower chair or bench in shower?: (Patient-Rptd) No Elevated toilet seat or a handicapped toilet?: (Patient-Rptd) No  TIMED UP AND GO:  Was the test performed?  No  Cognitive Function: 6CIT completed        05/29/2023    3:13 PM 05/24/2022   11:00 AM  6CIT Screen  What Year? 0 points 0 points  What month? 0 points 0 points  What time? 0 points 0 points  Count back from 20 0 points 0 points  Months in reverse 0 points 0 points  Repeat phrase 0 points 0 points  Total Score 0 points 0 points    Immunizations Immunization History  Administered Date(s) Administered   Fluad Quad(high Dose 65+) 12/31/2020, 12/03/2022   H1N1 03/15/2008   Hep A / Hep B 04/12/2022   Influenza Split 12/25/2006, 01/03/2010, 12/19/2010   Influenza, High Dose Seasonal PF 12/25/2018, 12/17/2019   Influenza,inj,Quad PF,6+ Mos 12/16/2015, 12/07/2016, 12/11/2017   Influenza-Unspecified 12/25/2006, 01/03/2010, 12/19/2010, 01/07/2015, 12/17/2019, 12/19/2021   Moderna Covid-19 Vaccine Bivalent Booster 23yrs & up 12/03/2022   Novel Infuenza-h1n1-09 03/15/2008   PFIZER(Purple Top)SARS-COV-2 Vaccination 05/04/2019, 05/25/2019, 01/12/2020, 12/19/2021   Pfizer Covid-19 Vaccine Bivalent Booster 76yrs & up 01/20/2021   Pneumococcal Conjugate-13 03/23/2016   Pneumococcal Polysaccharide-23 06/08/2011   Respiratory Syncytial Virus Vaccine,Recomb Aduvanted(Arexvy) 02/21/2022   Td 04/23/2008, 05/17/2022   Tdap 06/08/2011, 09/13/2012   Typhoid Inactivated 04/25/2022   Zoster  Recombinant(Shingrix) 10/04/2020, 01/20/2021   Zoster, Live 12/23/2007, 06/08/2011, 09/13/2012    Screening Tests Health Maintenance  Topic Date Due   Pneumonia Vaccine 73+ Years old (3 of 3 - PPSV23 or PCV20) 03/23/2021   COVID-19 Vaccine (7 - 2024-25 season) 01/28/2023   Medicare Annual Wellness (AWV)  05/28/2024   Colonoscopy  10/19/2025   DTaP/Tdap/Td (5 - Td or Tdap) 05/16/2032   INFLUENZA VACCINE  Completed   Hepatitis C Screening  Completed   Zoster Vaccines- Shingrix  Completed   HPV VACCINES  Aged Out    Health Maintenance  Health Maintenance Due  Topic Date Due   Pneumonia Vaccine 59+ Years old (3 of 3 - PPSV23 or PCV20) 03/23/2021   COVID-19 Vaccine (7 - 2024-25 season) 01/28/2023   Health Maintenance Items Addressed: None needed  Additional Screening:  Vision Screening: Recommended annual ophthalmology exams for early detection of glaucoma and other disorders of the eye.  Dental Screening: Recommended annual dental exams for proper oral hygiene  Community Resource Referral / Chronic Care Management: CRR required this visit?  No   CCM required this visit?  No    Plan:     I have personally reviewed and noted the following in the patient's chart:   Medical and social history Use of alcohol, tobacco or illicit drugs  Current medications and supplements including opioid prescriptions. Patient is not currently taking opioid prescriptions. Functional ability and status Nutritional status Physical activity Advanced directives  List of other physicians Hospitalizations, surgeries, and ER visits in previous 12 months Vitals Screenings to include cognitive, depression, and falls Referrals and appointments  In addition, I have reviewed and discussed with patient certain preventive protocols, quality metrics, and best practice recommendations. A written personalized care plan for preventive services as well as general preventive health recommendations were  provided to patient.    Sue Lush, LPN   1/61/0960   After Visit Summary: (MyChart) Due to this being a telephonic visit, the after visit summary with patients personalized plan was offered to patient via MyChart   Notes:  pt reports he has problems with is left nostril always being congested and no drainage. He wants to discuss at PE later on in the month w/PCP.

## 2023-05-29 NOTE — Patient Instructions (Signed)
 Brian Glass , Thank you for taking time to come for your Medicare Wellness Visit. I appreciate your ongoing commitment to your health goals. Please review the following plan we discussed and let me know if I can assist you in the future.   Referrals/Orders/Follow-Ups/Clinician Recommendations: none  This is a list of the screening recommended for you and due dates:  Health Maintenance  Topic Date Due   Pneumonia Vaccine (3 of 3 - PPSV23 or PCV20) 03/23/2021   COVID-19 Vaccine (7 - 2024-25 season) 01/28/2023   Medicare Annual Wellness Visit  05/28/2024   Colon Cancer Screening  10/19/2025   DTaP/Tdap/Td vaccine (5 - Td or Tdap) 05/16/2032   Flu Shot  Completed   Hepatitis C Screening  Completed   Zoster (Shingles) Vaccine  Completed   HPV Vaccine  Aged Out    Advanced directives: (In Chart) A copy of your advanced directives are scanned into your chart should your provider ever need it.  Next Medicare Annual Wellness Visit scheduled for next year: Yes 05/30/2023 @ 3pm televisit

## 2023-06-04 ENCOUNTER — Ambulatory Visit: Payer: Medicare Other | Attending: Urology | Admitting: Physical Therapy

## 2023-06-04 DIAGNOSIS — M533 Sacrococcygeal disorders, not elsewhere classified: Secondary | ICD-10-CM | POA: Insufficient documentation

## 2023-06-04 DIAGNOSIS — M25552 Pain in left hip: Secondary | ICD-10-CM | POA: Diagnosis not present

## 2023-06-04 DIAGNOSIS — M25551 Pain in right hip: Secondary | ICD-10-CM | POA: Insufficient documentation

## 2023-06-04 DIAGNOSIS — R278 Other lack of coordination: Secondary | ICD-10-CM | POA: Insufficient documentation

## 2023-06-04 DIAGNOSIS — R2689 Other abnormalities of gait and mobility: Secondary | ICD-10-CM | POA: Diagnosis not present

## 2023-06-04 NOTE — Therapy (Signed)
 OUTPATIENT PHYSICAL THERAPY Treatment  Patient Name: Brian Glass MRN: [id number] DOB:[birth year], 73 y.o., male Today's Date: 06/04/2023   PT End of Session - 06/04/23 1510     Visit Number 5    Number of Visits 10    Date for PT Re-Evaluation 06/18/23    PT Start Time 1508    PT Stop Time 1548    PT Time Calculation (min) 40 min    Activity Tolerance Patient tolerated treatment well;No increased pain             Past Medical History:  Diagnosis Date   Allergy    Arthritis    Atrial fibrillation (HCC)    BCC (basal cell carcinoma) 11/08/2021   right medial cheek, Mohs 12/06/21   Bilateral vitreous detachment    Chickenpox    Hx of basal cell carcinoma 2018   L malar cheek   Prostate cancer (HCC) 2020   Typical atrial flutter (HCC)    Past Surgical History:  Procedure Laterality Date   ATRIAL FIBRILLATION ABLATION N/A 05/10/2016   Procedure: Atrial Fibrillation Ablation;  Surgeon: Hillis Range, MD;  Location: [facility] INVASIVE CV LAB;  Service: Cardiovascular;  Laterality: N/A;   CATARACT EXTRACTION, BILATERAL  2016   COLONOSCOPY WITH PROPOFOL N/A 10/20/2018   Procedure: COLONOSCOPY WITH PROPOFOL;  Surgeon: Toney Reil, MD;  Location: [facility] ENDOSCOPY;  Service: Gastroenterology;  Laterality: N/A;   EYE SURGERY     MOHS SURGERY Left 2018   malar cheek, BCC   MOHS SURGERY Right    cheek 2023   ROBOT ASSISTED LAPAROSCOPIC RADICAL PROSTATECTOMY N/A 04/06/2019   Procedure: XI ROBOTIC ASSISTED LAPAROSCOPIC RADICAL PROSTATECTOMY;  Surgeon: Sondra Come, MD;  Location: [facility] ORS;  Service: Urology;  Laterality: N/A;   TONSILLECTOMY  1955   Patient Active Problem List   Diagnosis Date Noted   Acute bilateral low back pain without sciatica 01/29/2023   Tick bites 08/23/2022   Chronic hip pain 02/15/2022   Acute pain of right shoulder 02/15/2022   Thrombocytopenia (HCC) 11/10/2019   History of prostate cancer 04/06/2019   Encounter for screening colonoscopy  10/01/2018   Wart 11/28/2017   Medicare annual wellness visit, subsequent 03/23/2016   Paroxysmal atrial fibrillation (HCC) 11/29/2015   History of prostatectomy 11/29/2015   Hyperlipidemia 03/24/2015    PCP: [facility]   REFERRING PROVIDER: [place]   REFERRING DIAG: Continuous leakage of urine  Rationale for Evaluation and Treatment Rehabilitation  THERAPY DIAG:  Sacrococcygeal disorders, not elsewhere classified  Other abnormalities of gait and mobility  Pain in left hip  Pain in right hip  Other lack of coordination  ONSET DATE:   SUBJECTIVE:        SUBJECTIVE STATEMENT TODAY:               Pt noticed leakage occurred 2-3 x day with size of pencil eraser to to quarter in amount in underwear. In the past, he leaked 3-4 x day similar amounts. Pt is better with less leakage when stepping off his cycling machine.    SUBJECTIVE STATEMENT ON EVAL 04/13/23 : Urinary Leakage occurs after  climbing off spin bicycle.   Pt was doing good with leakage up to Dec 2023 and going without a pad. In Dec 2023, he had wet his pants . No more big leaks since this episode. Pt started back wearing pads for the year.  Between 4-6 pm in evening, leakage occurs.   Pt has been inconsistent with past pelvic PT HEP ,  when he remembered to do them, while watching TV. Pt understand that his motivation is key to doing HEP. He states" dry pants" is the motivator   Workout routine:  3-4 x week, spin class,  push ups 100 per day, walking 10K steps. No stretching routine after walking. Pt and wife used to do yoga but the teacher left.     PERTINENT HISTORY:  Pt had prostate surgery 2021.   PAIN:  Are you having pain? No   PRECAUTIONS: No  WEIGHT BEARING RESTRICTIONS: No  FALLS:  Has patient fallen in last 6 months? No  LIVING ENVIRONMENT: Lives with: wife  Lives in: house  Stairs: STE with rail 2 steps to garage, 3 STE back door    OCCUPATION:  retired as Occupational hygienist   PLOF:  Independent  PATIENT GOALS:  Stop leaking   OBJECTIVE:                    [facility] PT Assessment - 06/04/23 1641       Palpation   Palpation comment no more abdominal scar restrictions , fascial restrictions             [facility] Adult PT Treatment/Exercise - 06/04/23 1640       Therapeutic Activites    Other Therapeutic Activities guided pt progression to 5 sec, 5 reps, provided encouragement for continuation of HEP compliance      Neuro Re-ed    Neuro Re-ed Details  excessive cues for more anterior pelvic floor contraction in seated position               HOME EXERCISE PROGRAM: See pt instruction section    ASSESSMENT:  CLINICAL IMPRESSION:   Improvements:    Pt tried the new technique with dismounting the bike and he noticed less leakage.  Shoe lift  continues to help level pelvic alignment. Pt noticed leakage occurred 2-3 x day with size of pencil eraser to to quarter in amount in underwear. In the past, he leaked 3-4 x day similar amounts. Pt is better with less leakage when stepping off his cycling machine.    Pt showed no more fascial restrictions over flank and no more low abdomen B  over scar restrictions.               Provided  excessive cues for seated kegels to correct dyscoordination , provided visual, verbal , tactile cues for proper coordination , less posterior and bearing down. Pt demo'd  improved coordination post Tx.Pt demo'd progression to 5 sec long holds in hooklying position 5 reps.  Provided encouragement for continuation of HEP compliance. Pt agreed to return after one month .     Plan to review  HEP promote optimize IAP system for improved pelvic floor function, trunk stability, gait, balance, stabilization with mobility tasks.   Pt benefits from skilled PT.      OBJECTIVE IMPAIRMENTS decreased activity tolerance, decreased coordination, decreased endurance, decreased mobility, difficulty walking, decreased ROM, decreased strength,  decreased safety awareness, hypomobility, increased muscle spasms, impaired flexibility, improper body mechanics, postural dysfunction,. scar restrictions   ACTIVITY LIMITATIONS  self-care,  home chores, work tasks    PARTICIPATION LIMITATIONS:  community, gym activities    PERSONAL FACTORS    are also affecting patient's functional outcome.    REHAB POTENTIAL: Good   CLINICAL DECISION MAKING: Evolving/moderate complexity   EVALUATION COMPLEXITY: Moderate    PATIENT EDUCATION:    Education details: Showed pt anatomy images. Explained muscles attachments/  connection, physiology of deep core system/ spinal- thoracic-pelvis-lower kinetic chain as they relate to pt's presentation, Sx, and past Hx. Explained what and how these areas of deficits need to be restored to balance and function    See Therapeutic activity / neuromuscular re-education section  Answered pt's questions.   Person educated: Patient Education method: Explanation, Demonstration, Tactile cues, Verbal cues, and Handouts Education comprehension: verbalized understanding, returned demonstration, verbal cues required, tactile cues required, and needs further education     PLAN: PT FREQUENCY: 1x/week   PT DURATION: 10 weeks   PLANNED INTERVENTIONS: Therapeutic exercises, Therapeutic activity, Neuromuscular re-education, Balance training, Gait training, Patient/Family education, Self Care, Joint mobilization, Spinal mobilization, Moist heat, Taping, and Manual therapy, dry needling.   PLAN FOR NEXT SESSION: See clinical impression for plan     GOALS: Goals reviewed with patient? Yes  SHORT TERM GOALS: Target date: 05/07/2023    Pt will demo IND with HEP                    Baseline: Not IND            Goal status: INITIAL   LONG TERM GOALS: Target date:  06/18/2023   1.Pt will demo proper deep core coordination without chest breathing and optimal excursion of diaphragm/pelvic floor in order to promote spinal  stability and pelvic floor function  Baseline: dyscoordination Goal status: INITIAL  2.  Pt will demo > 5 pt change on FOTO  to improve QOL and function  PFDI Urinary baseline -  4 Lower score = better function  Urinary Problem baseline-   62 Higher score = better function    Goal status: INITIAL  3.  Pt will demo proper body mechanics in against gravity tasks and ADLs  work tasks, fitness  to minimize straining pelvic floor / back    Baseline: not IND, improper form that places strain on pelvic floor  Goal status: INITIAL    4. Pt will demo increased gait speed > 1.3 m/s with reciprocal gait pattern, longer stride length  in order to ambulate safely in community and return to fitness routine  Baseline: 1.26 m/s ( excessive sway L pelvis)  Goal status: INITIAL    5. Pt will demo levelled pelvic girdle and shoulder height in order to progress to deep core strengthening HEP and restore mobility at spine, pelvis, gait, posture minimize falls, and improve balance   Baseline: L shoulder, R pelvic lowered  Goal status: INITIAL   6. Pt reports no leakage between 4-6 pm in the evening across 1 week.  Baseline:  Between 4-6 pm in evening, leakage occurs.  Goal status: INITIAL     Mariane Masters, PT 06/04/2023, 3:10 PM

## 2023-06-04 NOTE — Patient Instructions (Signed)
 Progress to long holds in hooklying 5 sec, 5 reps   More anterior activation with quick 3 reps,

## 2023-06-12 ENCOUNTER — Encounter: Payer: Medicare Other | Admitting: Physical Therapy

## 2023-06-13 ENCOUNTER — Ambulatory Visit: Payer: Medicare Other | Admitting: Primary Care

## 2023-06-13 ENCOUNTER — Encounter: Payer: Self-pay | Admitting: Primary Care

## 2023-06-13 VITALS — BP 124/68 | HR 58 | Temp 97.2°F | Ht 70.0 in | Wt 168.0 lb

## 2023-06-13 DIAGNOSIS — D696 Thrombocytopenia, unspecified: Secondary | ICD-10-CM

## 2023-06-13 DIAGNOSIS — R0981 Nasal congestion: Secondary | ICD-10-CM | POA: Insufficient documentation

## 2023-06-13 DIAGNOSIS — E785 Hyperlipidemia, unspecified: Secondary | ICD-10-CM

## 2023-06-13 DIAGNOSIS — M545 Low back pain, unspecified: Secondary | ICD-10-CM | POA: Diagnosis not present

## 2023-06-13 DIAGNOSIS — I480 Paroxysmal atrial fibrillation: Secondary | ICD-10-CM

## 2023-06-13 DIAGNOSIS — Z8546 Personal history of malignant neoplasm of prostate: Secondary | ICD-10-CM

## 2023-06-13 DIAGNOSIS — G8929 Other chronic pain: Secondary | ICD-10-CM | POA: Diagnosis not present

## 2023-06-13 LAB — COMPREHENSIVE METABOLIC PANEL WITH GFR
ALT: 22 U/L (ref 0–53)
AST: 23 U/L (ref 0–37)
Albumin: 4.3 g/dL (ref 3.5–5.2)
Alkaline Phosphatase: 85 U/L (ref 39–117)
BUN: 22 mg/dL (ref 6–23)
CO2: 33 meq/L — ABNORMAL HIGH (ref 19–32)
Calcium: 9.2 mg/dL (ref 8.4–10.5)
Chloride: 103 meq/L (ref 96–112)
Creatinine, Ser: 1.07 mg/dL (ref 0.40–1.50)
GFR: 69.34 mL/min (ref >60.00)
Glucose, Bld: 90 mg/dL (ref 70–99)
Potassium: 4.5 meq/L (ref 3.5–5.1)
Sodium: 140 meq/L (ref 135–145)
Total Bilirubin: 1.2 mg/dL (ref 0.2–1.2)
Total Protein: 6.3 g/dL (ref 6.0–8.3)

## 2023-06-13 LAB — LIPID PANEL
Cholesterol: 121 mg/dL (ref 0–200)
HDL: 52.6 mg/dL (ref >39.00)
LDL Cholesterol: 58 mg/dL (ref 0–99)
NonHDL: 68.68
Total CHOL/HDL Ratio: 2
Triglycerides: 54 mg/dL (ref 0.0–149.0)
VLDL: 10.8 mg/dL (ref 0.0–40.0)

## 2023-06-13 LAB — CBC
HCT: 43.3 % (ref 39.0–52.0)
Hemoglobin: 15 g/dL (ref 13.0–17.0)
MCHC: 34.7 g/dL (ref 30.0–36.0)
MCV: 89.5 fl (ref 78.0–100.0)
Platelets: 120 10*3/uL — ABNORMAL LOW (ref 150.0–400.0)
RBC: 4.84 Mil/uL (ref 4.22–5.81)
RDW: 12.9 % (ref 11.5–15.5)
WBC: 5.1 10*3/uL (ref 4.0–10.5)

## 2023-06-13 NOTE — Assessment & Plan Note (Signed)
 Overall stable. Offered PT for which he declines.  Continue cyclobenzaprine 5 mg PRN.

## 2023-06-13 NOTE — Assessment & Plan Note (Signed)
Rate and rhythm regular. Continue to monitor.  

## 2023-06-13 NOTE — Assessment & Plan Note (Signed)
 Following with Urology. PSA level to be collected soon per Urology.

## 2023-06-13 NOTE — Assessment & Plan Note (Signed)
 Continue Flonase, discussed BID use Add Zyrtec HS. Continue sinus rinses.   Referral placed to ENT for further evaluation.

## 2023-06-13 NOTE — Progress Notes (Signed)
 Subjective:    Patient ID: Brian Glass, male    DOB: [birth year], 73 y.o.   MRN: [id number]  HPI  Brian Glass is a very pleasant 73 y.o. male with a history of paroxysmal atrial fibrillation, thrombocytopenia, hyperlipidemia, prostate cancer who presents today for follow up of chronic conditions.  Immunizations: -Tetanus: Completed in 2024 -Influenza: Completed last season  -Shingles: Completed Shingrix series -Pneumonia: Completed Prevnar 13 in 2018, Pneumovax 23 in 2013. He had the updated pneumonia vaccine in October 2024.  Colonoscopy: Completed in 2020, due 2027  PSA: Due   1) Hyperlipidemia: Currently managed on rosuvastatin 5 mg daily.  He is due for repeat lipid panel today.  2) Paroxysmal Atrial Fibrillation: Not on anticoagulant treatment.  No episodes in years. He denies palpitations, chest pain, dizziness, headaches.   3) History of Prostate Cancer/Erectile Dysfunction: Following with Urology. Last PSA was in April 2024, undetectable. Has an appointment coming up soon with Urology.   4) Chronic Nasal Congestion: Chronic for >1 year, occurs on the left side only. He's tried Flonase daily, sinus rinses, and more recently nasal strips at night. Overall he's not noticing much improvement.   BP Readings from Last 3 Encounters:  06/13/23 124/68  01/29/23 134/78  11/27/22 113/76      Review of Systems  Constitutional:  Negative for unexpected weight change.  HENT:  Positive for congestion. Negative for rhinorrhea.   Respiratory:  Negative for cough and shortness of breath.   Cardiovascular:  Negative for chest pain and palpitations.  Gastrointestinal:  Negative for constipation and diarrhea.  Genitourinary:  Negative for difficulty urinating.  Musculoskeletal:  Positive for arthralgias and back pain.  Skin:  Negative for rash.  Allergic/Immunologic: Positive for environmental allergies.  Neurological:  Negative for dizziness and headaches.   Psychiatric/Behavioral:  The patient is not nervous/anxious.          Past Medical History:  Diagnosis Date   Allergy    Arthritis    Atrial fibrillation (HCC)    BCC (basal cell carcinoma) 11/08/2021   right medial cheek, Mohs 12/06/21   Bilateral vitreous detachment    Chickenpox    Hx of basal cell carcinoma 2018   L malar cheek   Prostate cancer (HCC) 2020   Tick bites 08/23/2022   Typical atrial flutter (HCC)    Wart 11/28/2017    Social History   Socioeconomic History   Marital status: Married    Spouse name: Alvino Chapel   Number of children: Not on file   Years of education: Not on file   Highest education level: Master's degree (e.g., MA, MS, MEng, MEd, MSW, MBA)  Occupational History   Not on file  Tobacco Use   Smoking status: Never    Passive exposure: Never   Smokeless tobacco: Former    Types: Chew    Quit date: 1970  Vaping Use   Vaping status: Never Used  Substance and Sexual Activity   Alcohol use: Yes    Alcohol/week: 2.0 standard drinks of alcohol    Types: 2 Cans of beer per week    Comment: social   Drug use: No   Sexual activity: Yes    Birth control/protection: None  Other Topics Concern   Not on file  Social History Narrative   Married.   3 children. 2 grandchildren.   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   Enjoys biking, bird watching.   Social Drivers of Health   Financial Resource Strain: Low Risk  (06/09/2023)  Overall Financial Resource Strain (CARDIA)    Difficulty of Paying Living Expenses: Not hard at all  Food Insecurity: No Food Insecurity (06/09/2023)   Hunger Vital Sign    Worried About Running Out of Food in the Last Year: Never true    Ran Out of Food in the Last Year: Never true  Transportation Needs: No Transportation Needs (06/09/2023)   PRAPARE - Administrator, Civil Service (Medical): No    Lack of Transportation (Non-Medical): No  Physical Activity: Sufficiently Active (06/09/2023)   Exercise Vital Sign     Days of Exercise per Week: 5 days    Minutes of Exercise per Session: 60 min  Stress: No Stress Concern Present (06/09/2023)   [employer] of Occupational Health - Occupational Stress Questionnaire    Feeling of Stress : Not at all  Social Connections: Moderately Integrated (06/09/2023)   Social Connection and Isolation Panel [NHANES]    Frequency of Communication with Friends and Family: Twice a week    Frequency of Social Gatherings with Friends and Family: More than three times a week    Attends Religious Services: Never    Database administrator or Organizations: Yes    Attends Engineer, structural: More than 4 times per year    Marital Status: Married  Catering manager Violence: Not At Risk (05/24/2022)   Humiliation, Afraid, Rape, and Kick questionnaire    Fear of Current or Ex-Partner: No    Emotionally Abused: No    Physically Abused: No    Sexually Abused: No    Past Surgical History:  Procedure Laterality Date   ATRIAL FIBRILLATION ABLATION N/A 05/10/2016   Procedure: Atrial Fibrillation Ablation;  Surgeon: Hillis Range, MD;  Location: [facility] INVASIVE CV LAB;  Service: Cardiovascular;  Laterality: N/A;   CATARACT EXTRACTION, BILATERAL  2016   COLONOSCOPY WITH PROPOFOL N/A 10/20/2018   Procedure: COLONOSCOPY WITH PROPOFOL;  Surgeon: Toney Reil, MD;  Location: [facility] ENDOSCOPY;  Service: Gastroenterology;  Laterality: N/A;   EYE SURGERY     MOHS SURGERY Left 2018   malar cheek, BCC   MOHS SURGERY Right    cheek 2023   ROBOT ASSISTED LAPAROSCOPIC RADICAL PROSTATECTOMY N/A 04/06/2019   Procedure: XI ROBOTIC ASSISTED LAPAROSCOPIC RADICAL PROSTATECTOMY;  Surgeon: Sondra Come, MD;  Location: [facility] ORS;  Service: Urology;  Laterality: N/A;   TONSILLECTOMY  1955    Family History  Problem Relation Age of Onset   Hyperlipidemia Mother    Heart disease Mother    Hypertension Mother    Hyperlipidemia Father    Heart disease Father    Stroke Father     Hypertension Father    Heart attack Father    Prostate cancer Brother    Atrial fibrillation Brother    Sleep apnea Brother    CAD Brother     No Known Allergies  Current Outpatient Medications on File Prior to Visit  Medication Sig Dispense Refill   Calcium Polycarbophil (EQ FIBER LAXATIVE PO) Take 1 capsule by mouth daily.      fluticasone (FLONASE) 50 MCG/ACT nasal spray Place 1 spray into both nostrils daily as needed for allergies.      Multiple Vitamins-Minerals (SENIOR MULTIVITAMIN PLUS PO) Take 1 tablet by mouth daily.     Omega-3 Fatty Acids (FISH OIL PO) Take 1,300 mg by mouth daily.     rosuvastatin (CRESTOR) 5 MG tablet Take 1 tablet (5 mg total) by mouth daily. for cholesterol. 90 tablet  2   cyclobenzaprine (FLEXERIL) 5 MG tablet Take 1 tablet (5 mg total) by mouth at bedtime as needed for muscle spasms. (Patient not taking: Reported on 06/13/2023) 14 tablet 0   sildenafil (VIAGRA) 100 MG tablet Take 1 tablet (100 mg total) by mouth daily as needed for erectile dysfunction. (Patient not taking: Reported on 06/13/2023) 30 tablet 11   No current facility-administered medications on file prior to visit.    BP 124/68   Pulse (!) 58   Temp (!) 97.2 F (36.2 C) (Temporal)   Ht 5\' 10"  (1.778 m)   Wt 168 lb (76.2 kg)   SpO2 99%   BMI 24.11 kg/m  Objective:   Physical Exam HENT:     Nose:     Left Turbinates: Swollen.  Cardiovascular:     Rate and Rhythm: Normal rate and regular rhythm.  Pulmonary:     Effort: Pulmonary effort is normal.     Breath sounds: Normal breath sounds.  Abdominal:     General: Bowel sounds are normal.     Palpations: Abdomen is soft.     Tenderness: There is no abdominal tenderness.  Musculoskeletal:     Cervical back: Neck supple.  Skin:    General: Skin is warm and dry.  Neurological:     Mental Status: He is alert and oriented to person, place, and time.  Psychiatric:        Mood and Affect: Mood normal.           Assessment  & Plan:  Chronic bilateral low back pain without sciatica Assessment & Plan: Overall stable. Offered PT for which he declines.  Continue cyclobenzaprine 5 mg PRN.   Nasal congestion Assessment & Plan: Continue Flonase, discussed BID use Add Zyrtec HS. Continue sinus rinses.   Referral placed to ENT for further evaluation.   Orders: -     Ambulatory referral to ENT  Hyperlipidemia, unspecified hyperlipidemia type Assessment & Plan: Repeat lipid panel pending. Continue rosuvastatin 5 mg daily.  Orders: -     Comprehensive metabolic panel with GFR -     Lipid panel  History of prostate cancer Assessment & Plan: Following with Urology. PSA level to be collected soon per Urology.    Thrombocytopenia (HCC) Assessment & Plan: Stable over the years. Repeat CBC pending.  Orders: -     CBC  Paroxysmal atrial fibrillation (HCC) Assessment & Plan: Rate and rhythm regular. Continue to monitor.          Doreene Nest, NP

## 2023-06-13 NOTE — Assessment & Plan Note (Signed)
 Repeat lipid panel pending. Continue rosuvastatin 5 mg daily.

## 2023-06-13 NOTE — Assessment & Plan Note (Signed)
Stable over the years. Repeat CBC pending.

## 2023-06-13 NOTE — Patient Instructions (Addendum)
 Stop by the lab prior to leaving today. I will notify you of your results once received.   You will either be contacted via phone regarding your referral to ENT, or you may receive a letter on your MyChart portal from our referral team with instructions for scheduling an appointment. Please let us know if you have not been contacted by anyone within two weeks.  Start a daily antihistamine such as Zyrtec at bedtime.  Increase Flonase to 1 spray in each nostril twice daily.  It was a pleasure to see you today!

## 2023-06-17 DIAGNOSIS — E785 Hyperlipidemia, unspecified: Secondary | ICD-10-CM

## 2023-06-17 MED ORDER — ROSUVASTATIN CALCIUM 5 MG PO TABS: 5.0000 mg | ORAL_TABLET | Freq: Every day | ORAL | 3 refills | Status: AC

## 2023-06-19 ENCOUNTER — Encounter: Payer: Medicare Other | Admitting: Physical Therapy

## 2023-06-26 ENCOUNTER — Encounter: Payer: Medicare Other | Admitting: Physical Therapy

## 2023-07-01 ENCOUNTER — Telehealth: Payer: Self-pay

## 2023-07-01 DIAGNOSIS — J302 Other seasonal allergic rhinitis: Secondary | ICD-10-CM | POA: Diagnosis not present

## 2023-07-01 DIAGNOSIS — J3489 Other specified disorders of nose and nasal sinuses: Secondary | ICD-10-CM | POA: Diagnosis not present

## 2023-07-01 NOTE — Telephone Encounter (Signed)
 Called patient and LVM advising that biopsy from right upper back shows benign inflamed keratosis. Glitch in system delayed our response.  Sue Em., RMA

## 2023-07-02 ENCOUNTER — Other Ambulatory Visit: Payer: Self-pay

## 2023-07-02 ENCOUNTER — Encounter: Payer: Self-pay | Admitting: *Deleted

## 2023-07-02 ENCOUNTER — Other Ambulatory Visit

## 2023-07-02 ENCOUNTER — Other Ambulatory Visit
Admission: RE | Admit: 2023-07-02 | Discharge: 2023-07-02 | Disposition: A | Discharge status: Home / Self Care | Payer: Self-pay | Source: Ambulatory Visit | Attending: Oncology | Admitting: Oncology

## 2023-07-02 DIAGNOSIS — C61 Malignant neoplasm of prostate: Secondary | ICD-10-CM

## 2023-07-02 DIAGNOSIS — J301 Allergic rhinitis due to pollen: Secondary | ICD-10-CM | POA: Diagnosis not present

## 2023-07-02 DIAGNOSIS — Z006 Encounter for examination for normal comparison and control in clinical research program: Secondary | ICD-10-CM | POA: Insufficient documentation

## 2023-07-03 ENCOUNTER — Encounter: Payer: Medicare Other | Admitting: Physical Therapy

## 2023-07-03 LAB — PSA: Prostate Specific Ag, Serum: <0.1 ng/mL (ref 0.0–4.0)

## 2023-07-04 ENCOUNTER — Ambulatory Visit (INDEPENDENT_AMBULATORY_CARE_PROVIDER_SITE_OTHER): Payer: Self-pay | Admitting: Urology

## 2023-07-04 VITALS — BP 113/70 | Ht 70.0 in | Wt 165.0 lb

## 2023-07-04 DIAGNOSIS — N529 Male erectile dysfunction, unspecified: Secondary | ICD-10-CM | POA: Diagnosis not present

## 2023-07-04 DIAGNOSIS — C61 Malignant neoplasm of prostate: Secondary | ICD-10-CM

## 2023-07-04 NOTE — Patient Instructions (Signed)
 Continue working with pelvic floor physical therapy.  If you have worsening leakage or interested in other surgical options potentially for your leakage, would recommend reaching out to Dr. Jarvis Mesa who is a urologist with [facility] in [place] that performs continence procedures.  If you end up needing a referral we are happy to place that.

## 2023-07-04 NOTE — Progress Notes (Signed)
   07/04/2023 2:53 PM   Joelyn Music [birth year] [id number]  Reason for visit: Follow up prostate cancer, erectile dysfunction  HPI: I saw [name] back in urology clinic for prostate cancer follow-up after undergoing robotic prostatectomy for favorable intermediate risk prostate cancer on 04/06/2019.  Final pathology showed Gleason score 3+4 =7 disease in 25% of the prostate with negative margins, stage pT2Nx.  A bilateral nerve sparing procedure was performed   His first post-op PSA on 06/15/2019 was <0.1.  PSA remains <0.1 in April 2025.  We discussed the need for ongoing PSA monitoring on a yearly basis.  He has had some persistent incontinence without realization that is primarily postvoid dribbling.  This was very minimal initially, but has worsened over the last 6 months.  He has been working with pelvic floor physical therapy which she feels may be somewhat helpful.  He is not having any stress or urge incontinence.  He has baseline nocturia 3-4 times at night that was present prior to undergoing surgery.  I have previously offered referral to Dr. Jarvis Mesa in [place] to consider sling or other options but he has deferred.  In terms of erections, currently getting sufficient erections with 100 mg sildenafil on demand.  This was refilled.  Sildenafil 100 mg as needed  Okay to place referral to Dr. Jarvis Mesa in [place] for consideration of urethral sling if patient desires in the future RTC 1 year PSA prior   Lawerence Pressman, MD   [facility] [address], [address] [place], [place] [address] [phone]

## 2023-07-10 ENCOUNTER — Encounter: Payer: Medicare Other | Admitting: Physical Therapy

## 2023-07-15 LAB — GENECONNECT MOLECULAR SCREEN: Genetic Analysis Overall Interpretation: NEGATIVE

## 2023-07-17 ENCOUNTER — Encounter: Payer: Medicare Other | Admitting: Physical Therapy

## 2023-07-23 DIAGNOSIS — J329 Chronic sinusitis, unspecified: Secondary | ICD-10-CM | POA: Diagnosis not present

## 2023-07-23 DIAGNOSIS — J3489 Other specified disorders of nose and nasal sinuses: Secondary | ICD-10-CM | POA: Diagnosis not present

## 2023-07-24 ENCOUNTER — Ambulatory Visit: Payer: Medicare Other | Attending: Urology | Admitting: Physical Therapy

## 2023-07-24 DIAGNOSIS — R278 Other lack of coordination: Secondary | ICD-10-CM | POA: Insufficient documentation

## 2023-07-24 DIAGNOSIS — M533 Sacrococcygeal disorders, not elsewhere classified: Secondary | ICD-10-CM | POA: Insufficient documentation

## 2023-07-24 DIAGNOSIS — R2689 Other abnormalities of gait and mobility: Secondary | ICD-10-CM | POA: Insufficient documentation

## 2023-07-24 DIAGNOSIS — M25552 Pain in left hip: Secondary | ICD-10-CM | POA: Insufficient documentation

## 2023-07-24 DIAGNOSIS — M25551 Pain in right hip: Secondary | ICD-10-CM | POA: Insufficient documentation

## 2023-07-31 ENCOUNTER — Ambulatory Visit: Admitting: Physical Therapy

## 2023-07-31 DIAGNOSIS — R2689 Other abnormalities of gait and mobility: Secondary | ICD-10-CM | POA: Diagnosis not present

## 2023-07-31 DIAGNOSIS — M25551 Pain in right hip: Secondary | ICD-10-CM | POA: Diagnosis not present

## 2023-07-31 DIAGNOSIS — M533 Sacrococcygeal disorders, not elsewhere classified: Secondary | ICD-10-CM

## 2023-07-31 DIAGNOSIS — R278 Other lack of coordination: Secondary | ICD-10-CM | POA: Diagnosis not present

## 2023-07-31 DIAGNOSIS — M25552 Pain in left hip: Secondary | ICD-10-CM

## 2023-07-31 NOTE — Therapy (Addendum)
 OUTPATIENT PHYSICAL THERAPY Treatment / RECERT  Patient Name: Brian Glass MRN: [id number] DOB:1951-03-09, 73 y.o., male Today's Date: 07/31/2023   PT End of Session - 07/31/23 1109     Visit Number 6    Number of Visits 16    Date for PT Re-Evaluation 10/09/23    PT Start Time 1105    PT Stop Time 1150    PT Time Calculation (min) 45 min    Activity Tolerance Patient tolerated treatment well;No increased pain             Past Medical History:  Diagnosis Date   Allergy    Arthritis    Atrial fibrillation (HCC)    BCC (basal cell carcinoma) 11/08/2021   right medial cheek, Mohs 12/06/21   Bilateral vitreous detachment    Chickenpox    Hx of basal cell carcinoma 2018   L malar cheek   Prostate cancer (HCC) 2020   Tick bites 08/23/2022   Typical atrial flutter (HCC)    Wart 11/28/2017   Past Surgical History:  Procedure Laterality Date   ATRIAL FIBRILLATION ABLATION N/A 05/10/2016   Procedure: Atrial Fibrillation Ablation;  Surgeon: Jolly Needle, MD;  Location: [facility] INVASIVE CV LAB;  Service: Cardiovascular;  Laterality: N/A;   CATARACT EXTRACTION, BILATERAL  2016   COLONOSCOPY WITH PROPOFOL  N/A 10/20/2018   Procedure: COLONOSCOPY WITH PROPOFOL ;  Surgeon: Selena Daily, MD;  Location: [facility] ENDOSCOPY;  Service: Gastroenterology;  Laterality: N/A;   EYE SURGERY     MOHS SURGERY Left 2018   malar cheek, BCC   MOHS SURGERY Right    cheek 2023   ROBOT ASSISTED LAPAROSCOPIC RADICAL PROSTATECTOMY N/A 04/06/2019   Procedure: XI ROBOTIC ASSISTED LAPAROSCOPIC RADICAL PROSTATECTOMY;  Surgeon: Lawerence Pressman, MD;  Location: [facility] ORS;  Service: Urology;  Laterality: N/A;   TONSILLECTOMY  1955   Patient Active Problem List   Diagnosis Date Noted   Nasal congestion 06/13/2023   Chronic back pain 01/29/2023   Chronic hip pain 02/15/2022   Thrombocytopenia (HCC) 11/10/2019   History of prostate cancer 04/06/2019   Encounter for screening colonoscopy 10/01/2018    Medicare annual wellness visit, subsequent 03/23/2016   Paroxysmal atrial fibrillation (HCC) 11/29/2015   History of prostatectomy 11/29/2015   Hyperlipidemia 03/24/2015    PCP: [facility]   REFERRING PROVIDER: [place]   REFERRING DIAG: Continuous leakage of urine  Rationale for Evaluation and Treatment Rehabilitation  THERAPY DIAG:  Sacrococcygeal disorders, not elsewhere classified  Other abnormalities of gait and mobility  Pain in left hip  Pain in right hip  Other lack of coordination  ONSET DATE:   SUBJECTIVE:        SUBJECTIVE STATEMENT TODAY:                Pt reports  "I am frustrated in that in the last week, I will urinate and give it a minute or two pause just to make sure I am done, walk to the other end of the house and leak. This is happening a couple times a day.   For a while I was leaking only maybe once or twice a day, maybe one day a week I was dry but to be dry is the exception. I have never mentioned it but when I am home I tend to sit rather than stand when I urinate. It keeps any splashing down, as well as since the operation and the catheter, I tend to not have a consistent aim. "  Pt  reports when he leaks, 50% of the time he leaks and not know it prior to leakage while he knew he leaked the other 50% of the time   Pt has leakage 4-5 days of the week with 2-3 leakage episodes   Pt has been doing his long and quick contraction kegels with deep core exercises. Pt is cycling 3 x a week.   SUBJECTIVE STATEMENT ON EVAL 04/13/23 : Urinary Leakage occurs after  climbing off spin bicycle.   Pt was doing good with leakage up to Dec 2023 and going without a pad. In Dec 2023, he had wet his pants . No more big leaks since this episode. Pt started back wearing pads for the year.  Between 4-6 pm in evening, leakage occurs.   Pt has been inconsistent with past pelvic PT HEP , when he remembered to do them, while watching TV. Pt understand that his motivation is key  to doing HEP. He states" dry pants" is the motivator   Workout routine:  3-4 x week, spin class,  push ups 100 per day, walking 10K steps. No stretching routine after walking. Pt and wife used to do yoga but the teacher left.     PERTINENT HISTORY:  Pt had prostate surgery 2021.   PAIN:  Are you having pain? No   PRECAUTIONS: No  WEIGHT BEARING RESTRICTIONS: No  FALLS:  Has patient fallen in last 6 months? No  LIVING ENVIRONMENT: Lives with: wife  Lives in: house  Stairs: STE with rail 2 steps to garage, 3 STE back door    OCCUPATION:  retired as Occupational hygienist   PLOF: Independent  PATIENT GOALS:  Stop leaking   OBJECTIVE:                   [facility] PT Assessment - 07/31/23 1330       Palpation   Palpation comment hypomobile midfoot joints of R shorter leg, tightness along adductor/ pectineus , peroneal longus/brevis, tib-fib hypombile R             Pelvic Floor Special Questions - 07/31/23 1328     External Perineal Exam undergarments lowered below pubic bone: noted tightness of addcutor/ pectineus, R anterior pelvic floor mm R > L,  dyscoordination of quick contractiuonof kegel,  After cues for proper coordinaiton, pt demo'd improved upward movement of pelvic floor which will help with complete urination and minimizing post urine dribble/ incontinence               [facility] Adult PT Treatment/Exercise - 07/31/23 1329       Self-Care   Other Self-Care Comments  discussed role of foot hypomobility and dyscoordination of pelvic floor contributing to post urine dribble and incontinence , encouraged compliance to HEP as pt has improved leakage by 33% of the time compared to prior to PT      Neuro Re-ed    Neuro Re-ed Details  cued for proper upward coordination with pelvic floor contraction to correct dyscoordination of mm,  cued for self-massage for feet mobility      Manual Therapy   Manual therapy comments STM/MWM at l ong anterior pelvi c floor, adductors, medial  /lateral knee, PA/AP mob Grade III at midfoot joints R, STM/MWM at plantar fascia R                  HOME EXERCISE PROGRAM: See pt instruction section    ASSESSMENT:  CLINICAL IMPRESSION: Pt met 5/6 goals and progressing well  towards remaining goal which is related to complete urination and further improving continence with proper coordination of pelvic floor mm.   Improvements:    Pt tried the new technique with dismounting the bike and he noticed less leakage.  Shoe lift  continues to help level pelvic alignment. Pt noticed leakage occurred 2-3 x day with size of pencil eraser to to quarter in amount in underwear. In the past, he leaked 3-4 x day similar amounts. Pt is better with less leakage when stepping off his cycling machine.   Across the past month with compliance to HEP, pt has leakage 4-5 days of the week with 2-3 leakage episodes   Today focused on minimizing tightness along R anterior pelvic floor mm and adductor, pectineus on RLE which is his shorter leg. Pt has been wearing shoe lift in R shoe but reports Hx of multiple injuries to R ankle and foot.  Pt also required cues for proper pelvic floor activation for upward movement of pelvic floor which will help with remaining issues re: incomplete emptying.    Plan to review  HEP promote optimize IAP system for improved pelvic floor function, trunk stability, gait, balance, stabilization with mobility tasks.   Pt benefits from skilled PT.      OBJECTIVE IMPAIRMENTS decreased activity tolerance, decreased coordination, decreased endurance, decreased mobility, difficulty walking, decreased ROM, decreased strength, decreased safety awareness, hypomobility, increased muscle spasms, impaired flexibility, improper body mechanics, postural dysfunction,. scar restrictions   ACTIVITY LIMITATIONS  self-care,  home chores, work tasks    PARTICIPATION LIMITATIONS:  community, gym activities    PERSONAL FACTORS    are also  affecting patient's functional outcome.    REHAB POTENTIAL: Good   CLINICAL DECISION MAKING: Evolving/moderate complexity   EVALUATION COMPLEXITY: Moderate    PATIENT EDUCATION:    Education details: Showed pt anatomy images. Explained muscles attachments/ connection, physiology of deep core system/ spinal- thoracic-pelvis-lower kinetic chain as they relate to pt's presentation, Sx, and past Hx. Explained what and how these areas of deficits need to be restored to balance and function    See Therapeutic activity / neuromuscular re-education section  Answered pt's questions.   Person educated: Patient Education method: Explanation, Demonstration, Tactile cues, Verbal cues, and Handouts Education comprehension: verbalized understanding, returned demonstration, verbal cues required, tactile cues required, and needs further education     PLAN: PT FREQUENCY: 1x/week   PT DURATION: 10 weeks   PLANNED INTERVENTIONS: Therapeutic exercises, Therapeutic activity, Neuromuscular re-education, Balance training, Gait training, Patient/Family education, Self Care, Joint mobilization, Spinal mobilization, Moist heat, Taping, and Manual therapy, dry needling.   PLAN FOR NEXT SESSION: See clinical impression for plan     GOALS: Goals reviewed with patient? Yes  SHORT TERM GOALS: Target date: 05/07/2023    Pt will demo IND with HEP                    Baseline: Not IND            Goal status: INITIAL   LONG TERM GOALS: Target date: 10/09/2023     1.Pt will demo proper deep core coordination without chest breathing and optimal excursion of diaphragm/pelvic floor in order to promote spinal stability and pelvic floor function  Baseline: dyscoordination Goal status: MET   2.  Pt will demo > 5 pt change on FOTO  to improve QOL and function  PFDI Urinary baseline -  4 Lower score = better function  Urinary Problem baseline-  62 Higher score = better function    Goal status:  Deferred  3.  Pt will demo proper body mechanics in against gravity tasks and ADLs  work tasks, fitness  to minimize straining pelvic floor / back    Baseline: not IND, improper form that places strain on pelvic floor  Goal status: MET     4. Pt will demo increased gait speed > 1.3 m/s with reciprocal gait pattern, longer stride length  in order to ambulate safely in community and return to fitness routine  Baseline: 1.26 m/s ( excessive sway L pelvis)  Goal status: MET  ( 5/`14/25: still compliant with shoe lift in R shoe )    5. Pt will demo levelled pelvic girdle and shoulder height in order to progress to deep core strengthening HEP and restore mobility at spine, pelvis, gait, posture minimize falls, and improve balance   Baseline: L shoulder, R pelvic lowered  Goal status: MET    6. Pt reports no leakage between 4-6 pm in the evening across 1 week.  Baseline:  Between 4-6 pm in evening, leakage occurs.  Goal status: MET    7. Pt will report complete emptying of urine > 50% of the time and noticing more continence after urination > 75% of the time after emptying  Baseline:, 50% of the time he leaks and not know it prior to leakage while he knew he leaked the other 50% of the time  Goal status: NEW      [place] Andreas, PT 07/31/2023, 11:10 AM

## 2023-07-31 NOTE — Patient Instructions (Addendum)
 Feet care :  Self -feet massage   Handshake : fingers between toes, moving ballmounds/toes back and forth several times while other hand anchors at arch. Do the same at the hind/mid foot.  Heel to toes upward to a letter Big Letter T strokes to spread ballmounds and toes, several times, pinch between webs of toes  Run finger tips along top of foot between long bones "comb between the bones"    Wiggle toes and spread them out when relaxing   __  Kegel with suctioning lift not a grasping in order to create a upward lift not a pushing down   __    Keep up with inner thighs with cycling

## 2023-08-21 ENCOUNTER — Ambulatory Visit: Attending: Urology | Admitting: Physical Therapy

## 2023-08-21 DIAGNOSIS — M533 Sacrococcygeal disorders, not elsewhere classified: Secondary | ICD-10-CM | POA: Insufficient documentation

## 2023-08-21 DIAGNOSIS — R2689 Other abnormalities of gait and mobility: Secondary | ICD-10-CM | POA: Insufficient documentation

## 2023-08-21 DIAGNOSIS — M25551 Pain in right hip: Secondary | ICD-10-CM | POA: Diagnosis not present

## 2023-08-21 DIAGNOSIS — R278 Other lack of coordination: Secondary | ICD-10-CM | POA: Insufficient documentation

## 2023-08-21 NOTE — Addendum Note (Signed)
 Addended by: Mod[place] Andreas on: 08/21/2023 11:14 AM   Modules accepted: Orders

## 2023-08-21 NOTE — Addendum Note (Signed)
 Addended by: Modesto Andreas on: 08/21/2023 11:11 AM   Modules accepted: Orders

## 2023-08-21 NOTE — Therapy (Signed)
 OUTPATIENT PHYSICAL THERAPY Treatment  MRN: [id number] DOB:Apr 08, 1950, 73 y.o., male Today's Date: 08/21/2023   PT End of Session - 08/21/23 1114     Visit Number 7    Number of Visits 16    Date for PT Re-Evaluation 10/09/23    PT Start Time 1105    PT Stop Time 1145    PT Time Calculation (min) 40 min    Activity Tolerance Patient tolerated treatment well;No increased pain             Past Medical History:  Diagnosis Date   Allergy    Arthritis    Atrial fibrillation (HCC)    BCC (basal cell carcinoma) 11/08/2021   right medial cheek, Mohs 12/06/21   Bilateral vitreous detachment    Chickenpox    Hx of basal cell carcinoma 2018   L malar cheek   Prostate cancer (HCC) 2020   Tick bites 08/23/2022   Typical atrial flutter (HCC)    Wart 11/28/2017   Past Surgical History:  Procedure Laterality Date   ATRIAL FIBRILLATION ABLATION N/A 05/10/2016   Procedure: Atrial Fibrillation Ablation;  Surgeon: Jolly Needle, MD;  Location[facility] INVASIVE CV LAB;  Service: Cardiovascular;  Laterality: N/A;   CATARACT EXTRACTION, BILATERAL  2016   COLONOSCOPY WITH PROPOFOL  N/A 10/20/2018   Procedure: COLONOSCOPY WITH PROPOFOL ;  Surgeon: Selena Daily, MD;  Location: [facility] ENDOSCOPY;  Service: Gastroenterology;  Laterality: N/A;   EYE SURGERY     MOHS SURGERY Left 2018   malar cheek, BCC   MOHS SURGERY Right    cheek 2023   ROBOT ASSISTED LAPAROSCOPIC RADICAL PROSTATECTOMY N/A 04/06/2019   Procedure: XI ROBOTIC ASSISTED LAPAROSCOPIC RADICAL PROSTATECTOMY;  Surgeon: Lawerence Pressman, MD;  Location: [facility] ORS;  Service: Urology;  Laterality: N/A;   TONSILLECTOMY  1955   Patient Active Problem List   Diagnosis Date Noted   Nasal congestion 06/13/2023   Chronic back pain 01/29/2023   Chronic hip pain 02/15/2022   Thrombocytopenia (HCC) 11/10/2019   History of prostate cancer 04/06/2019   Encounter for screening colonoscopy 10/01/2018   Medicare annual wellness visit, subsequent  03/23/2016   Paroxysmal atrial fibrillation (HCC) 11/29/2015   History of prostatectomy 11/29/2015   Hyperlipidemia 03/24/2015    PCP: [facility] Job   REFERRING PROVIDER: [place]   REFERRING DIAG: Continuous leakage of urine  Rationale for Evaluation and Treatment Rehabilitation  THERAPY DIAG:  Sacrococcygeal disorders, not elsewhere classified  Other abnormalities of gait and mobility  Pain in right hip  Other lack of coordination  ONSET DATE:   SUBJECTIVE:        SUBJECTIVE STATEMENT TODAY:                Pt reports had only 2 days across 2 weeks when he leaked without awareness which is an improvement  Pt has noticed one weekend without leakage 2 days and maybe more he said     SUBJECTIVE STATEMENT ON EVAL 04/13/23 : Urinary Leakage occurs after  climbing off spin bicycle.   Pt was doing good with leakage up to Dec 2023 and going without a pad. In Dec 2023, he had wet his pants . No more big leaks since this episode. Pt started back wearing pads for the year.  Between 4-6 pm in evening, leakage occurs.   Pt has been inconsistent with past pelvic PT HEP , when he remembered to do them, while watching TV. Pt understand that his motivation is key to doing HEP. He states"  dry pants" is the motivator   Workout routine:  3-4 x week, spin class,  push ups 100 per day, walking 10K steps. No stretching routine after walking. Pt and wife used to do yoga but the teacher left.     PERTINENT HISTORY:  Pt had prostate surgery 2021.   PAIN:  Are you having pain? No   PRECAUTIONS: No  WEIGHT BEARING RESTRICTIONS: No  FALLS:  Has patient fallen in last 6 months? No  LIVING ENVIRONMENT: Lives with: wife  Lives in: house  Stairs: STE with rail 2 steps to garage, 3 STE back door    OCCUPATION:  retired as Occupational hygienist   PLOF: Independent  PATIENT GOALS:  Stop leaking   OBJECTIVE:      Pelvic Floor Special Questions - 08/21/23 1259     External Perineal Exam through  clothing: hooklying: ldyscoordination with i nhalation ( chest breathing),  proper activation with long holds 5 sec, 5 reps.  Seated: posterior activation of pelvic floor    Minimal adductor tightness               [facility] Adult PT Treatment/Exercise - 08/21/23 1259       Self-Care   Other Self-Care Comments  provided biopsychosocial strategy to continue with compliance and track days of continence tfor positive feedback loop to minimize discorouagement      Neuro Re-ed    Neuro Re-ed Details  excessive cues for less chest breathing with deep core and cued for les posterior activation of quick kegel to promote more urethral sphincter activation               HOME EXERCISE PROGRAM: See pt instruction section    ASSESSMENT:  CLINICAL IMPRESSION:                Pt required cues for proper diaphragmatic excursion for pelvic floor coordination in hooklying position for endurance kegels and cues for less posterior activation of quick kegel to promote more urethral sphincter activation.               Provided biopsychosocial strategy to continue with compliance and track days of continence tfor positive feedback loop to minimize discorouagement   Plan to review  HEP promote optimize IAP system for improved pelvic floor function, trunk stability, gait, balance, stabilization with mobility tasks.   Pt benefits from skilled PT.      OBJECTIVE IMPAIRMENTS decreased activity tolerance, decreased coordination, decreased endurance, decreased mobility, difficulty walking, decreased ROM, decreased strength, decreased safety awareness, hypomobility, increased muscle spasms, impaired flexibility, improper body mechanics, postural dysfunction,. scar restrictions   ACTIVITY LIMITATIONS  self-care,  home chores, work tasks    PARTICIPATION LIMITATIONS:  community, gym activities    PERSONAL FACTORS    are also affecting patient's functional outcome.    REHAB POTENTIAL: Good    CLINICAL DECISION MAKING: Evolving/moderate complexity   EVALUATION COMPLEXITY: Moderate    PATIENT EDUCATION:    Education details: Showed pt anatomy images. Explained muscles attachments/ connection, physiology of deep core system/ spinal- thoracic-pelvis-lower kinetic chain as they relate to pt's presentation, Sx, and past Hx. Explained what and how these areas of deficits need to be restored to balance and function    See Therapeutic activity / neuromuscular re-education section  Answered pt's questions.   Person educated: Patient Education method: Explanation, Demonstration, Tactile cues, Verbal cues, and Handouts Education comprehension: verbalized understanding, returned demonstration, verbal cues required, tactile cues required, and needs further education  PLAN: PT FREQUENCY: 1x/week   PT DURATION: 10 weeks   PLANNED INTERVENTIONS: Therapeutic exercises, Therapeutic activity, Neuromuscular re-education, Balance training, Gait training, Patient/Family education, Self Care, Joint mobilization, Spinal mobilization, Moist heat, Taping, and Manual therapy, dry needling.   PLAN FOR NEXT SESSION: See clinical impression for plan     GOALS: Goals reviewed with patient? Yes  SHORT TERM GOALS: Target date: 05/07/2023    Pt will demo IND with HEP                    Baseline: Not IND            Goal status: INITIAL   LONG TERM GOALS: Target date: 10/09/2023     1.Pt will demo proper deep core coordination without chest breathing and optimal excursion of diaphragm/pelvic floor in order to promote spinal stability and pelvic floor function  Baseline: dyscoordination Goal status: MET   2.  Pt will demo > 5 pt change on FOTO  to improve QOL and function  PFDI Urinary baseline -  4 Lower score = better function  Urinary Problem baseline-   62 Higher score = better function    Goal status: Deferred  3.  Pt will demo proper body mechanics in against gravity  tasks and ADLs  work tasks, fitness  to minimize straining pelvic floor / back    Baseline: not IND, improper form that places strain on pelvic floor  Goal status: MET     4. Pt will demo increased gait speed > 1.3 m/s with reciprocal gait pattern, longer stride length  in order to ambulate safely in community and return to fitness routine  Baseline: 1.26 m/s ( excessive sway L pelvis)  Goal status: MET  ( 5/`14/25: still compliant with shoe lift in R shoe )    5. Pt will demo levelled pelvic girdle and shoulder height in order to progress to deep core strengthening HEP and restore mobility at spine, pelvis, gait, posture minimize falls, and improve balance   Baseline: L shoulder, R pelvic lowered  Goal status: MET    6. Pt reports no leakage between 4-6 pm in the evening across 1 week.  Baseline:  Between 4-6 pm in evening, leakage occurs.  Goal status: MET    7. Pt will report complete emptying of urine > 50% of the time and noticing more continence after urination > 75% of the time after emptying  Baseline:, 50% of the time he leaks and not know it prior to leakage while he knew he leaked the other 50% of the time  Goal status: NEW      Modesto Andreas, PT 08/21/2023, 11:14 AM

## 2023-09-25 ENCOUNTER — Ambulatory Visit (INDEPENDENT_AMBULATORY_CARE_PROVIDER_SITE_OTHER)
Admission: RE | Admit: 2023-09-25 | Discharge: 2023-09-25 | Disposition: A | Discharge status: Home / Self Care | Source: Ambulatory Visit | Attending: Primary Care | Admitting: Primary Care

## 2023-09-25 ENCOUNTER — Ambulatory Visit (INDEPENDENT_AMBULATORY_CARE_PROVIDER_SITE_OTHER): Admitting: Primary Care

## 2023-09-25 ENCOUNTER — Encounter: Payer: Self-pay | Admitting: Primary Care

## 2023-09-25 VITALS — BP 114/66 | HR 72 | Temp 97.2°F | Ht 70.0 in | Wt 169.0 lb

## 2023-09-25 DIAGNOSIS — M5030 Other cervical disc degeneration, unspecified cervical region: Secondary | ICD-10-CM | POA: Diagnosis not present

## 2023-09-25 DIAGNOSIS — M4802 Spinal stenosis, cervical region: Secondary | ICD-10-CM | POA: Diagnosis not present

## 2023-09-25 DIAGNOSIS — G8929 Other chronic pain: Secondary | ICD-10-CM | POA: Diagnosis not present

## 2023-09-25 DIAGNOSIS — M25511 Pain in right shoulder: Secondary | ICD-10-CM

## 2023-09-25 DIAGNOSIS — M47812 Spondylosis without myelopathy or radiculopathy, cervical region: Secondary | ICD-10-CM | POA: Diagnosis not present

## 2023-09-25 DIAGNOSIS — M549 Dorsalgia, unspecified: Secondary | ICD-10-CM | POA: Diagnosis not present

## 2023-09-25 DIAGNOSIS — M542 Cervicalgia: Secondary | ICD-10-CM

## 2023-09-25 MED ORDER — CYCLOBENZAPRINE HCL 5 MG PO TABS: 5.0000 mg | ORAL_TABLET | Freq: Every evening | ORAL | 0 refills | Status: DC | PRN

## 2023-09-25 MED ORDER — PREDNISONE 20 MG PO TABS: ORAL_TABLET | ORAL | 0 refills | Status: DC

## 2023-09-25 NOTE — Assessment & Plan Note (Signed)
 Stable.  Reviewed x-ray from November/December 2023.  We discussed results. He agrees to repeat x-ray today, ordered and pending.

## 2023-09-25 NOTE — Progress Notes (Signed)
 Subjective:    Patient ID: Brian Glass, male    DOB: 09-Apr-1950, 73 y.o.   MRN: [id number]  Neck Pain  Pertinent negatives include no numbness or weakness.    Brian Glass is a very pleasant 73 y.o. male with a history of chronic hip pain, chronic back pain, shoulder pain, prostate cancer who presents today to discuss neck pain.  He has a history of chronic neck pain to the bilateral upper neck which has been intermittent for years. About 2 months ago he was rolling over in bed and noticed a sudden sharp pain to the right upper neck with radiation of pain to the right scapular region.  This felt different than his typical neck pain.  Since then he's noticed the same intermittent right or left upper neck pain with radiation to the scapular region for which he describes as a spasm and sharp. His pain is provoked with rotation of his neck in any direction.   He denies radiation of pain down his upper extremities, paresthesias, injury/trauma. He's taken Naproxen with temporary improvement.   He underwent x-ray of the right shoulder in November 2023 which revealed mild osteoarthritis, sclerotic, centrally lucent, and mildly lobular lesion within the mid humeral head which was nonspecific.  Recommendation was follow-up x-ray in 3 months versus MRI.  This has yet to be done. He does experience intermittent shoulder pain with certain ranges of motion, specifically with stretching.  Overall his shoulder does not bother him.  He does have a history of rotator cuff issues that date back decades ago.   Review of Systems  Musculoskeletal:  Positive for neck pain.  Skin:  Negative for color change.  Neurological:  Negative for weakness and numbness.         Past Medical History:  Diagnosis Date   Allergy    Arthritis    Atrial fibrillation (HCC)    BCC (basal cell carcinoma) 11/08/2021   right medial cheek, Mohs 12/06/21   Bilateral vitreous detachment    Chickenpox    Hx of basal cell  carcinoma 2018   L malar cheek   Prostate cancer (HCC) 2020   Tick bites 08/23/2022   Typical atrial flutter (HCC)    Wart 11/28/2017    Social History   Socioeconomic History   Marital status: Married    Spouse name: Leeroy   Number of children: Not on file   Years of education: Not on file   Highest education level: Master's degree (e.g., MA, MS, MEng, MEd, MSW, MBA)  Occupational History   Not on file  Tobacco Use   Smoking status: Never    Passive exposure: Never   Smokeless tobacco: Former    Types: Chew    Quit date: 1970  Vaping Use   Vaping status: Never Used  Substance and Sexual Activity   Alcohol use: Yes    Alcohol/week: 2.0 standard drinks of alcohol    Types: 2 Cans of beer per week    Comment: social   Drug use: No   Sexual activity: Yes    Birth control/protection: None  Other Topics Concern   Not on file  Social History Narrative   Married.   3 children. 2 grandchildren.   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   Enjoys biking, bird watching.   Social Drivers of Corporate investment banker Strain: Low Risk  (09/23/2023)   Overall Financial Resource Strain (CARDIA)    Difficulty of Paying Living Expenses: Not hard at all  Food Insecurity: No Food Insecurity (09/23/2023)   Hunger Vital Sign    Worried About Running Out of Food in the Last Year: Never true    Ran Out of Food in the Last Year: Never true  Transportation Needs: No Transportation Needs (09/23/2023)   PRAPARE - Administrator, Civil Service (Medical): No    Lack of Transportation (Non-Medical): No  Physical Activity: Sufficiently Active (09/23/2023)   Exercise Vital Sign    Days of Exercise per Week: 4 days    Minutes of Exercise per Session: 60 min  Stress: No Stress Concern Present (09/23/2023)   [employer] of Occupational Health - Occupational Stress Questionnaire    Feeling of Stress: Only a little  Social Connections: Socially Integrated (09/23/2023)   Social Connection and  Isolation Panel    Frequency of Communication with Friends and Family: Once a week    Frequency of Social Gatherings with Friends and Family: Twice a week    Attends Religious Services: 1 to 4 times per year    Active Member of [employer] or Organizations: Yes    Attends Banker Meetings: 1 to 4 times per year    Marital Status: Married  Catering manager Violence: Not At Risk (05/24/2022)   Humiliation, Afraid, Rape, and Kick questionnaire    Fear of Current or Ex-Partner: No    Emotionally Abused: No    Physically Abused: No    Sexually Abused: No    Past Surgical History:  Procedure Laterality Date   ATRIAL FIBRILLATION ABLATION N/A 05/10/2016   Procedure: Atrial Fibrillation Ablation;  Surgeon: Lynwood Rakers, MD;  Location: [facility] INVASIVE CV LAB;  Service: Cardiovascular;  Laterality: N/A;   CATARACT EXTRACTION, BILATERAL  2016   COLONOSCOPY WITH PROPOFOL  N/A 10/20/2018   Procedure: COLONOSCOPY WITH PROPOFOL ;  Surgeon: Unk Corinn Skiff, MD;  Location[facility] ENDOSCOPY;  Service: Gastroenterology;  Laterality: N/A;   EYE SURGERY     MOHS SURGERY Left 2018   malar cheek, BCC   MOHS SURGERY Right    cheek 2023   ROBOT ASSISTED LAPAROSCOPIC RADICAL PROSTATECTOMY N/A 04/06/2019   Procedure: XI ROBOTIC ASSISTED LAPAROSCOPIC RADICAL PROSTATECTOMY;  Surgeon: Francisca Redell BROCKS, MD;  Location: [facility] ORS;  Service: Urology;  Laterality: N/A;   TONSILLECTOMY  1955    Family History  Problem Relation Age of Onset   Hyperlipidemia Mother    Heart disease Mother    Hypertension Mother    Hyperlipidemia Father    Heart disease Father    Stroke Father    Hypertension Father    Heart attack Father    Prostate cancer Brother    Atrial fibrillation Brother    Sleep apnea Brother    CAD Brother     No Known Allergies  Current Outpatient Medications on File Prior to Visit  Medication Sig Dispense Refill   Calcium  Polycarbophil (EQ FIBER LAXATIVE PO) Take 1 capsule by mouth daily.       fluticasone (FLONASE) 50 MCG/ACT nasal spray Place 1 spray into both nostrils daily as needed for allergies.      Multiple Vitamins-Minerals (SENIOR MULTIVITAMIN PLUS PO) Take 1 tablet by mouth daily.     Omega-3 Fatty Acids (FISH OIL PO) Take 1,300 mg by mouth daily.     rosuvastatin  (CRESTOR ) 5 MG tablet Take 1 tablet (5 mg total) by mouth daily. for cholesterol. 90 tablet 3   sildenafil  (VIAGRA ) 100 MG tablet Take 1 tablet (100 mg total) by mouth daily as  needed for erectile dysfunction. 30 tablet 11   No current facility-administered medications on file prior to visit.    BP 114/66   Pulse 72   Temp (!) 97.2 F (36.2 C) (Temporal)   Ht 5' 10 (1.778 m)   Wt 169 lb (76.7 kg)   SpO2 98%   BMI 24.25 kg/m  Objective:   Physical Exam Constitutional:      General: He is not in acute distress. Neck:     Comments: Mild decrease in range of motion due to pain with right and left lateral rotation of neck. Cardiovascular:     Rate and Rhythm: Normal rate.  Pulmonary:     Effort: Pulmonary effort is normal.  Musculoskeletal:     Right shoulder: Normal range of motion. Normal strength.     Left shoulder: Normal range of motion. Normal strength.     Cervical back: Pain with movement present. No spinous process tenderness or muscular tenderness. Decreased range of motion.           Assessment & Plan:  Chronic neck pain Assessment & Plan: Symptoms today suggestive of muscle spasm with nerve involvement.  X-rays of the cervical spine ordered and pending.  Start prednisone  tablets. Take two tablets my mouth once daily in the morning for four days, then one tablet once daily in the morning for four days.  Start cyclobenzaprine  muscle relaxer 5 mg at bedtime as needed.  Consider PT versus orthopedic referral if needed.  Orders: -     DG Cervical Spine Complete -     predniSONE ; Take two tablets my mouth once daily in the morning for four days, then one tablet once daily in the  morning for four days.  Dispense: 12 tablet; Refill: 0 -     Cyclobenzaprine  HCl; Take 1 tablet (5 mg total) by mouth at bedtime as needed for muscle spasms.  Dispense: 14 tablet; Refill: 0  Chronic right shoulder pain Assessment & Plan: Stable.  Reviewed x-ray from November/December 2023.  We discussed results. He agrees to repeat x-ray today, ordered and pending.   Orders: -     DG Shoulder Right        Comer MARLA Gaskins, NP

## 2023-09-25 NOTE — Patient Instructions (Signed)
 Complete xray(s) prior to leaving today. I will notify you of your results once received.  Start prednisone  tablets. Take two tablets my mouth once daily in the morning for four days, then one tablet once daily in the morning for four days.   You may take cyclobenzaprine  muscle relaxer at bedtime for muscle spasm.  It was a pleasure to see you today!

## 2023-09-25 NOTE — Assessment & Plan Note (Signed)
 Symptoms today suggestive of muscle spasm with nerve involvement.  X-rays of the cervical spine ordered and pending.  Start prednisone  tablets. Take two tablets my mouth once daily in the morning for four days, then one tablet once daily in the morning for four days.  Start cyclobenzaprine  muscle relaxer 5 mg at bedtime as needed.  Consider PT versus orthopedic referral if needed.

## 2023-09-29 ENCOUNTER — Ambulatory Visit: Payer: Self-pay | Admitting: Primary Care

## 2023-10-03 ENCOUNTER — Ambulatory Visit: Admitting: Dermatology

## 2023-10-03 ENCOUNTER — Encounter: Payer: Self-pay | Admitting: Dermatology

## 2023-10-03 DIAGNOSIS — L821 Other seborrheic keratosis: Secondary | ICD-10-CM

## 2023-10-03 DIAGNOSIS — L820 Inflamed seborrheic keratosis: Secondary | ICD-10-CM | POA: Diagnosis not present

## 2023-10-03 NOTE — Progress Notes (Signed)
   Follow-Up Visit   Subjective  Brian Glass is a 73 y.o. male who presents for the following: place at L neck, rubs with clothes and seat belt, gets irritated.   The following portions of the chart were reviewed this encounter and updated as appropriate: medications, allergies, medical history  Review of Systems:  No other skin or systemic complaints except as noted in HPI or Assessment and Plan.  Objective  Well appearing patient in no apparent distress; mood and affect are within normal limits.  A focused examination was performed of the following areas: L Neck  Relevant exam findings are noted in the Assessment and Plan.  L neck x1 Stuck on waxy paps with erythema  Assessment & Plan   SEBORRHEIC KERATOSIS - Stuck-on, waxy, tan-brown papules and/or plaques  - Benign-appearing - Discussed benign etiology and prognosis. - Observe - Call for any changes  INFLAMED SEBORRHEIC KERATOSIS L neck x1 Symptomatic, irritating, patient would like treated. Destruction of lesion - L neck x1 Complexity: simple   Destruction method: cryotherapy   Informed consent: discussed and consent obtained   Timeout:  patient name, date of birth, surgical site, and procedure verified Lesion destroyed using liquid nitrogen: Yes   Region frozen until ice ball extended beyond lesion: Yes   Cryo cycles: 1 or 2. Outcome: patient tolerated procedure well with no complications   Post-procedure details: wound care instructions given   Additional details:  Prior to procedure, discussed risks of blister formation, small wound, skin dyspigmentation, or rare scar following cryotherapy. Recommend Vaseline ointment to treated areas while healing.    Return for As scheduled, w/ Dr. Claudene, [facility].  I, Jacquelynn V. Wilfred, CMA, am acting as scribe for Boneta Claudene, MD .   Documentation: I have reviewed the above documentation for accuracy and completeness, and I agree with the above.  Boneta Claudene, MD

## 2023-10-03 NOTE — Patient Instructions (Addendum)

## 2023-10-15 DIAGNOSIS — J3489 Other specified disorders of nose and nasal sinuses: Secondary | ICD-10-CM | POA: Diagnosis not present

## 2023-11-20 ENCOUNTER — Encounter: Payer: Self-pay | Admitting: Urology

## 2023-11-28 ENCOUNTER — Ambulatory Visit: Payer: Medicare Other | Admitting: Dermatology

## 2023-11-28 ENCOUNTER — Encounter: Payer: Self-pay | Admitting: Dermatology

## 2023-11-28 DIAGNOSIS — D1801 Hemangioma of skin and subcutaneous tissue: Secondary | ICD-10-CM

## 2023-11-28 DIAGNOSIS — L578 Other skin changes due to chronic exposure to nonionizing radiation: Secondary | ICD-10-CM

## 2023-11-28 DIAGNOSIS — D229 Melanocytic nevi, unspecified: Secondary | ICD-10-CM

## 2023-11-28 DIAGNOSIS — L821 Other seborrheic keratosis: Secondary | ICD-10-CM

## 2023-11-28 DIAGNOSIS — W908XXA Exposure to other nonionizing radiation, initial encounter: Secondary | ICD-10-CM

## 2023-11-28 DIAGNOSIS — Z872 Personal history of diseases of the skin and subcutaneous tissue: Secondary | ICD-10-CM

## 2023-11-28 DIAGNOSIS — L814 Other melanin hyperpigmentation: Secondary | ICD-10-CM | POA: Diagnosis not present

## 2023-11-28 DIAGNOSIS — L84 Corns and callosities: Secondary | ICD-10-CM

## 2023-11-28 DIAGNOSIS — Z85828 Personal history of other malignant neoplasm of skin: Secondary | ICD-10-CM

## 2023-11-28 DIAGNOSIS — Z1283 Encounter for screening for malignant neoplasm of skin: Secondary | ICD-10-CM | POA: Diagnosis not present

## 2023-11-28 DIAGNOSIS — L720 Epidermal cyst: Secondary | ICD-10-CM | POA: Diagnosis not present

## 2023-11-28 NOTE — Progress Notes (Signed)
 Follow-Up Visit   Subjective  Brian Glass is a 73 y.o. male who presents for the following: Skin Cancer Screening and Full Body Skin Exam hx of BCCs, Aks, check spot R axilla has had for yrs, he expresses it periodically   The patient presents for Total-Body Skin Exam (TBSE) for skin cancer screening and mole check. The patient has spots, moles and lesions to be evaluated, some may be new or changing and the patient may have concern these could be cancer.    The following portions of the chart were reviewed this encounter and updated as appropriate: medications, allergies, medical history  Review of Systems:  No other skin or systemic complaints except as noted in HPI or Assessment and Plan.  Objective  Well appearing patient in no apparent distress; mood and affect are within normal limits.  A full examination was performed including scalp, head, eyes, ears, nose, lips, neck, chest, axillae, abdomen, back, buttocks, bilateral upper extremities, bilateral lower extremities, hands, feet, fingers, toes, fingernails, and toenails. All findings within normal limits unless otherwise noted below.   Relevant physical exam findings are noted in the Assessment and Plan.    Assessment & Plan   SKIN CANCER SCREENING PERFORMED TODAY.  ACTINIC DAMAGE - Chronic condition, secondary to cumulative UV/sun exposure - diffuse scaly erythematous macules with underlying dyspigmentation - Recommend daily broad spectrum sunscreen SPF 30+ to sun-exposed areas, reapply every 2 hours as needed.  - Staying in the shade or wearing long sleeves, sun glasses (UVA+UVB protection) and wide brim hats (4-inch brim around the entire circumference of the hat) are also recommended for sun protection.  - Call for new or changing lesions.  LENTIGINES, SEBORRHEIC KERATOSES, HEMANGIOMAS - Benign normal skin lesions - Benign-appearing - Call for any changes  MELANOCYTIC NEVI - Tan-brown and/or pink-flesh-colored  symmetric macules and papules - Benign appearing on exam today - Observation - Call clinic for new or changing moles - Recommend daily use of broad spectrum spf 30+ sunscreen to sun-exposed areas.   HISTORY OF BASAL CELL CARCINOMA OF THE SKIN - No evidence of recurrence today - Recommend regular full body skin exams - Recommend daily broad spectrum sunscreen SPF 30+ to sun-exposed areas, reapply every 2 hours as needed.  - Call if any new or changing lesions are noted between office visits  -L malar cheek, R medial cheek  HISTORY OF PRECANCEROUS ACTINIC KERATOSIS - site(s) of PreCancerous Actinic Keratosis clear today. - these may recur and new lesions may form requiring treatment to prevent transformation into skin cancer - observe for new or changing spots and contact [facility] for appointment if occur - photoprotection with sun protective clothing; sunglasses and broad spectrum sunscreen with SPF of at least 30 + and frequent self skin exams recommended - yearly exams by a dermatologist recommended for persons with history of PreCancerous Actinic Keratoses   EPIDERMAL INCLUSION CYST R axilla Exam: Subcutaneous nodule at R axilla  Benign-appearing. Exam most consistent with an epidermal inclusion cyst. Discussed that a cyst is a benign growth that can grow over time and sometimes get irritated or inflamed. Recommend observation if it is not bothersome. Discussed option of surgical excision to remove it if it is growing, symptomatic, or other changes noted. Please call for new or changing lesions so they can be evaluated.  CORNS R plantar foot at first webspace Exam: hyperkeratotic paps  Treatment Plan: Benign, observe.    MULTIPLE BENIGN NEVI   LENTIGINES   ACTINIC ELASTOSIS  SEBORRHEIC KERATOSES   CHERRY ANGIOMA   CORN OR CALLUS   EIC (EPIDERMAL INCLUSION CYST)   Return in about 1 year (around 11/27/2024) for TBSE, Hx of BCC, Hx of AKs.  I, Grayce Saunas, RMA, am acting as scribe for Boneta Sharps, MD .   Documentation: I have reviewed the above documentation for accuracy and completeness, and I agree with the above.  Boneta Sharps, MD

## 2023-11-28 NOTE — Patient Instructions (Addendum)

## 2023-12-11 DIAGNOSIS — Z23 Encounter for immunization: Secondary | ICD-10-CM | POA: Diagnosis not present

## 2024-04-21 ENCOUNTER — Ambulatory Visit: Admitting: Primary Care

## 2024-04-21 ENCOUNTER — Ambulatory Visit: Payer: Self-pay | Admitting: Primary Care

## 2024-04-21 ENCOUNTER — Encounter: Payer: Self-pay | Admitting: Primary Care

## 2024-04-21 ENCOUNTER — Ambulatory Visit

## 2024-04-21 ENCOUNTER — Ambulatory Visit: Payer: Self-pay

## 2024-04-21 VITALS — BP 128/68 | HR 60 | Temp 97.9°F | Ht 70.0 in | Wt 174.0 lb

## 2024-04-21 DIAGNOSIS — I499 Cardiac arrhythmia, unspecified: Secondary | ICD-10-CM

## 2024-04-21 DIAGNOSIS — I480 Paroxysmal atrial fibrillation: Secondary | ICD-10-CM | POA: Diagnosis not present

## 2024-04-21 DIAGNOSIS — R9431 Abnormal electrocardiogram [ECG] [EKG]: Secondary | ICD-10-CM

## 2024-04-21 LAB — CBC
HCT: 39.1 % (ref 39.0–52.0)
Hemoglobin: 13.8 g/dL (ref 13.0–17.0)
MCHC: 35.3 g/dL (ref 30.0–36.0)
MCV: 88.5 fl (ref 78.0–100.0)
Platelets: 122 10*3/uL — ABNORMAL LOW (ref 150.0–400.0)
RBC: 4.41 Mil/uL (ref 4.22–5.81)
RDW: 13 % (ref 11.5–15.5)
WBC: 5.8 10*3/uL (ref 4.0–10.5)

## 2024-04-21 LAB — BASIC METABOLIC PANEL WITH GFR
BUN: 26 mg/dL — ABNORMAL HIGH (ref 6–23)
CO2: 31 meq/L (ref 19–32)
Calcium: 8.7 mg/dL (ref 8.4–10.5)
Chloride: 105 meq/L (ref 96–112)
Creatinine, Ser: 0.93 mg/dL (ref 0.40–1.50)
GFR: 81.55 mL/min
Glucose, Bld: 84 mg/dL (ref 70–99)
Potassium: 4.3 meq/L (ref 3.5–5.1)
Sodium: 140 meq/L (ref 135–145)

## 2024-04-21 LAB — TSH: TSH: 1.67 u[IU]/mL (ref 0.35–5.50)

## 2024-04-21 NOTE — Telephone Encounter (Signed)
 FYI Only or Action Required?: FYI only for provider: appointment scheduled on 04/21/24.  Patient was last seen in primary care on 09/25/2023 by [name], NP.  Called Nurse Triage reporting Heart Problem.  Symptoms began yesterday.  Interventions attempted: Nothing.  Symptoms are: unchanged.  Triage Disposition: See HCP Within 4 Hours (Or PCP Triage)  Patient/caregiver understands and will follow disposition?: Yes  Message from Causey R sent at 04/21/2024  9:00 AM EST  Reason for Triage: Patient states he is having irregular heartbeat, states last night his heart rate was 58 and 64 this morning. beats 3 times pauses then beats 3-4 more times then pauses.   Reason for Disposition  [1] Skipped or extra beat(s) AND [2] occurs 4 or more times per minute  Answer Assessment - Initial Assessment Questions Pt concerned and had wife check HR, she is retired engineer, civil (consulting) as well. Pt wanting appt to get checked out.   1. DESCRIPTION: Please describe your heart rate or heartbeat that you are having (e.g., fast/slow, regular/irregular, skipped or extra beats, palpitations)     Having a pause after 3-4 beats  2. ONSET: When did it start? (e.g., minutes, hours, days)      Last night  4. PATTERN Does it come and go, or has it been constant since it started?  Does it get worse with exertion?   Are you feeling it now?     Almost constant  6. HEART RATE: Can you tell me your heart rate? How many beats in 15 seconds?  Note: Not all patients can do this.       58 last night, 64 this morning  9. CARDIAC HISTORY: Do you have any history of heart disease? (e.g., heart attack, angina, bypass surgery, angioplasty, arrhythmia)      Hx of Afib but didn't have any sx and had ablation in 2017  10. OTHER SYMPTOMS: Do you have any other symptoms? (e.g., dizziness, chest pain, sweating, difficulty breathing)       Denies all sx besides HA but doesn't feel like r/t HR  Protocols used: Heart  Rate and Heartbeat Questions-A-AH

## 2024-04-21 NOTE — Telephone Encounter (Signed)
 Noted, will evaluate.

## 2024-04-21 NOTE — Progress Notes (Addendum)
 "  Subjective:    Patient ID: Brian Glass, male    DOB: October 22, 1950, 74 y.o.   MRN: [id number]  Brian Glass is a very pleasant 74 y.o. male with a history of paroxysmal atrial fibrillation, prostate cancer, thrombocytopenia who presents today to discuss irregular heart rate.  History of paroxysmal atrial fibrillation dating back to around 2011.  Previously managed on amiodarone and apixaban .  He underwent cardiac ablation in February 2018.  Eliquis  discontinued in 2019.  Last night he was laying in bed he noticed an irregular heart beat, occurring every 3-4 beats. His wife was able to palpate. This morning he has noticed the irregular heart beats every 12 beats. Prior to symptom onset he was outdoors shoveling the driveway (snow) for about 20-30 minutes without symptoms or problems.   He denies palpitations, known tachycardia, diaphoresis, chest pain, shortness of breath, dizziness, excessive caffeine use, changes to diet.  BP Readings from Last 3 Encounters:  04/21/24 128/68  09/25/23 114/66  07/04/23 113/70      Review of Systems  Constitutional:  Negative for diaphoresis.  Respiratory:  Negative for shortness of breath.   Cardiovascular:  Negative for chest pain and palpitations.  Neurological:  Negative for dizziness and headaches.         Past Medical History:  Diagnosis Date   Actinic keratosis    Allergy    Arthritis    Atrial fibrillation (HCC)    BCC (basal cell carcinoma) 11/08/2021   right medial cheek, Mohs 12/06/21   Bilateral vitreous detachment    Chickenpox    Hx of basal cell carcinoma 2018   L malar cheek   Prostate cancer (HCC) 2020   Tick bites 08/23/2022   Typical atrial flutter (HCC)    Wart 11/28/2017    Social History   Socioeconomic History   Marital status: Married    Spouse name: Leeroy   Number of children: Not on file   Years of education: Not on file   Highest education level: Master's degree (e.g., MA, MS, MEng, MEd, MSW, MBA)   Occupational History   Not on file  Tobacco Use   Smoking status: Never    Passive exposure: Never   Smokeless tobacco: Former    Types: Chew    Quit date: 1970  Vaping Use   Vaping status: Never Used  Substance and Sexual Activity   Alcohol use: Yes    Alcohol/week: 2.0 standard drinks of alcohol    Types: 2 Cans of beer per week    Comment: social   Drug use: No   Sexual activity: Yes    Birth control/protection: None  Other Topics Concern   Not on file  Social History Narrative   Married.   3 children. 2 grandchildren.   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   Enjoys biking, bird watching.   Social Drivers of Health   Tobacco Use: Medium Risk (04/21/2024)   Patient History    Smoking Tobacco Use: Never    Smokeless Tobacco Use: Former    Passive Exposure: Never  Physicist, Medical Strain: Low Risk (09/23/2023)   Overall Financial Resource Strain (CARDIA)    Difficulty of Paying Living Expenses: Not hard at all  Food Insecurity: No Food Insecurity (09/23/2023)   Epic    Worried About Radiation Protection Practitioner of Food in the Last Year: Never true    Ran Out of Food in the Last Year: Never true  Transportation Needs: No Transportation Needs (09/23/2023)   Epic  Lack of Transportation (Medical): No    Lack of Transportation (Non-Medical): No  Physical Activity: Sufficiently Active (09/23/2023)   Exercise Vital Sign    Days of Exercise per Week: 4 days    Minutes of Exercise per Session: 60 min  Stress: No Stress Concern Present (09/23/2023)   [employer] of Occupational Health - Occupational Stress Questionnaire    Feeling of Stress: Only a little  Social Connections: Socially Integrated (09/23/2023)   Social Connection and Isolation Panel    Frequency of Communication with Friends and Family: Once a week    Frequency of Social Gatherings with Friends and Family: Twice a week    Attends Religious Services: 1 to 4 times per year    Active Member of Clubs or Organizations: Yes    Attends  Banker Meetings: 1 to 4 times per year    Marital Status: Married  Catering Manager Violence: Not At Risk (05/24/2022)   Humiliation, Afraid, Rape, and Kick questionnaire    Fear of Current or Ex-Partner: No    Emotionally Abused: No    Physically Abused: No    Sexually Abused: No  Depression (PHQ2-9): Low Risk (04/21/2024)   Depression (PHQ2-9)    PHQ-2 Score: 0  Alcohol Screen: Low Risk (09/23/2023)   Alcohol Screen    Last Alcohol Screening Score (AUDIT): 2  Housing: Unknown (09/23/2023)   Epic    Unable to Pay for Housing in the Last Year: No    Number of Times Moved in the Last Year: Not on file    Homeless in the Last Year: No  Utilities: Not At Risk (05/29/2023)   AHC Utilities    Threatened with loss of utilities: No  Health Literacy: Adequate Health Literacy (05/29/2023)   B1300 Health Literacy    Frequency of need for help with medical instructions: Never    Past Surgical History:  Procedure Laterality Date   ATRIAL FIBRILLATION ABLATION N/A 05/10/2016   Procedure: Atrial Fibrillation Ablation;  Surgeon: Lynwood Rakers, MD;  Location: [facility] INVASIVE CV LAB;  Service: Cardiovascular;  Laterality: N/A;   CATARACT EXTRACTION, BILATERAL  2016   COLONOSCOPY WITH PROPOFOL  N/A 10/20/2018   Procedure: COLONOSCOPY WITH PROPOFOL ;  Surgeon: Unk Corinn Skiff, MD;  Location: [facility] ENDOSCOPY;  Service: Gastroenterology;  Laterality: N/A;   EYE SURGERY     MOHS SURGERY Left 2018   malar cheek, BCC   MOHS SURGERY Right    cheek 2023   ROBOT ASSISTED LAPAROSCOPIC RADICAL PROSTATECTOMY N/A 04/06/2019   Procedure: XI ROBOTIC ASSISTED LAPAROSCOPIC RADICAL PROSTATECTOMY;  Surgeon: Francisca Redell BROCKS, MD;  Location: [facility] ORS;  Service: Urology;  Laterality: N/A;   TONSILLECTOMY  1955    Family History  Problem Relation Age of Onset   Hyperlipidemia Mother    Heart disease Mother    Hypertension Mother    Hyperlipidemia Father    Heart disease Father    Stroke Father     Hypertension Father    Heart attack Father    Prostate cancer Brother    Atrial fibrillation Brother    Sleep apnea Brother    CAD Brother     Allergies[1]  Medications Ordered Prior to Encounter[2]  BP 128/68   Pulse 60   Temp 97.9 F (36.6 C) (Oral)   Ht 5' 10 (1.778 m)   Wt 174 lb (78.9 kg)   SpO2 97%   BMI 24.97 kg/m  Objective:   Physical Exam Cardiovascular:     Rate and Rhythm:  Normal rate and regular rhythm.  Pulmonary:     Effort: Pulmonary effort is normal.     Breath sounds: Normal breath sounds.  Musculoskeletal:     Cervical back: Neck supple.  Skin:    General: Skin is warm and dry.  Neurological:     Mental Status: He is alert and oriented to person, place, and time.  Psychiatric:        Mood and Affect: Mood normal.     Physical Exam        Assessment & Plan:  Irregular heart beat Assessment & Plan: Examination today with normal sinus rhythm.  EKG today with sinus pericardia with rate of 58, incomplete right bundle branch block.  When compared to EKG from 2021 there is similarity.  Will also consult with cardiology given the potential first-degree AV block.  Labs pending today including CBC, TSH, BMP. Zio heart monitor ordered and pending.     Orders: -     EKG 12-Lead -     CBC -     Basic metabolic panel with GFR -     TSH -     LONG TERM MONITOR (3-14 DAYS); Future  Paroxysmal atrial fibrillation (HCC) -     LONG TERM MONITOR (3-14 DAYS); Future    Assessment and Plan Assessment & Plan         Comer MARLA Gaskins, NP       [1]  Allergies Allergen Reactions   Pseudoephedrine     Other Reaction(s): Urination Problems   Pseudoephedrine Hcl   [2]  Current Outpatient Medications on File Prior to Visit  Medication Sig Dispense Refill   Calcium  Polycarbophil (EQ FIBER LAXATIVE PO) Take 1 capsule by mouth daily.      fluticasone (FLONASE) 50 MCG/ACT nasal spray Place 1 spray into both nostrils daily as needed for  allergies.      Multiple Vitamins-Minerals (SENIOR MULTIVITAMIN PLUS PO) Take 1 tablet by mouth daily.     rosuvastatin  (CRESTOR ) 5 MG tablet Take 1 tablet (5 mg total) by mouth daily. for cholesterol. 90 tablet 3   sildenafil  (VIAGRA ) 100 MG tablet Take 1 tablet (100 mg total) by mouth daily as needed for erectile dysfunction. 30 tablet 11   No current facility-administered medications on file prior to visit.   "

## 2024-04-21 NOTE — Patient Instructions (Signed)
 Complete the heart monitor once received.  I will consult cardiology regarding your EKG and symptoms.  Stop by the lab prior to leaving today. I will notify you of your results once received.   It was a pleasure to see you today!

## 2024-04-23 ENCOUNTER — Ambulatory Visit

## 2024-04-23 VITALS — BP 96/64 | HR 68 | Ht 70.0 in | Wt 169.0 lb

## 2024-04-23 DIAGNOSIS — Z9889 Other specified postprocedural states: Secondary | ICD-10-CM | POA: Diagnosis not present

## 2024-04-23 DIAGNOSIS — E782 Mixed hyperlipidemia: Secondary | ICD-10-CM

## 2024-04-23 DIAGNOSIS — I479 Paroxysmal tachycardia, unspecified: Secondary | ICD-10-CM | POA: Diagnosis not present

## 2024-04-23 DIAGNOSIS — Z8679 Personal history of other diseases of the circulatory system: Secondary | ICD-10-CM

## 2024-04-23 DIAGNOSIS — I480 Paroxysmal atrial fibrillation: Secondary | ICD-10-CM

## 2024-04-23 NOTE — Patient Instructions (Signed)
 Medication Instructions:  Your physician recommends that you continue on your current medications as directed. Please refer to the Current Medication list given to you today.  *If you need a refill on your cardiac medications before your next appointment, please call your pharmacy*  Lab Work: No labs ordered today    Testing/Procedures: Your physician has requested that you have an echocardiogram. Echocardiography is a painless test that uses sound waves to create images of your heart. It provides your doctor with information about the size and shape of your heart and how well your hearts chambers and valves are working.   You may receive an ultrasound enhancing agent through an IV if needed to better visualize your heart during the echo. This procedure takes approximately one hour.  There are no restrictions for this procedure.  This will take place at [address] [facility] [address], [place] [address]  Please note: We ask at that you not bring children with you during ultrasound (echo/ vascular) testing. Due to room size and safety concerns, children are not allowed in the ultrasound rooms during exams. Our front office staff cannot provide observation of children in our lobby area while testing is being conducted. An adult accompanying a patient to their appointment will only be allowed in the ultrasound room at the discretion of the ultrasound technician under special circumstances. We apologize for any inconvenience.   Follow-Up: At [facility], you and your health needs are our priority.  As part of our continuing mission to provide you with exceptional heart care, our providers are all part of one team.  This team includes your primary Cardiologist (physician) and Advanced Practice Providers or APPs (Physician Assistants and Nurse Practitioners) who all work together to provide you with the care you need, when you need it.  Your next appointment:   3  month(s)  Provider:   Caron Poser, MD

## 2024-04-23 NOTE — Progress Notes (Signed)
" °  Cardiology Office Note   Date:  04/23/2024  ID:  Brian Glass, DOB July 15, 1950, MRN [id number] PCP: [name], NP  [facility] Providers Cardiologist:  Caron Poser, MD     History of Present Illness Brian Glass is a 74 y.o. male PMH HLD, paroxysmal atrial fibrillation status post PVI ablation 04/2016 who presents for further evaluation and management of paroxysmal tachycardia and abnormal ECG.  Seen by PCP for this issue 04/21/2024.  Noted intermittent palpitations at that time.  No syncope.  TSH normal 04/2024.  Last LDL 58 05/2023.  Previously saw our EP team, but has not followed up since after his ablation.  Patient reports that he has palpitations a couple of times per day.  Denies any syncope.  When asked if it feels like his prior episodes of atrial fibrillation, he says that he cannot recall and felt that he was mostly symptomatic when he had atrial fibrillation.  Symptoms seem to occur in the late evening and early morning.  He is otherwise not having any other issues or inhibitions of his functional status.  Denies any chest pain.  Denies any orthopnea, lower extremity edema, dyspnea, or other signs or symptoms of heart failure.  Relevant CVD History -Monitor pending -PVI ablation 04/2016 (Dr.Allred) -No coronary artery calcifications seen on preablation cardiac CT 04/2016 -TTE 12/2015 normal biventricular function, mild AI, PASP 36 mmHg, mildly dilated aortic root to 37 mm   ROS: Pt denies any chest discomfort, jaw pain, arm pain, palpitations, syncope, presyncope, orthopnea, PND, or LE edema.  Studies Reviewed I have independently reviewed the patient's ECG, previous cardiac testing, previous medical records, previous blood work.  Physical Exam VS:  BP 96/64 (BP Location: Left Arm, Patient Position: Sitting, Cuff Size: Normal)   Pulse 68   Ht 5' 10 (1.778 m)   Wt 169 lb (76.7 kg)   SpO2 97%   BMI 24.25 kg/m        Wt Readings from Last 3 Encounters:   04/23/24 169 lb (76.7 kg)  04/21/24 174 lb (78.9 kg)  09/25/23 169 lb (76.7 kg)    GEN: No acute distress. NECK: No JVD; No carotid bruits. CARDIAC: RRR, no murmurs, rubs, gallops. RESPIRATORY:  Clear to auscultation. EXTREMITIES:  Warm and well-perfused. No edema.  ASSESSMENT AND PLAN Paroxysmal tachycardia Paroxysmal atrial fibrillation status post ablation 04/2016 First-degree AV block Patient presents with brief paroxysms of tachycardia which occur a few times per day.  No high risk symptoms such as syncope.  He has no functional limitations or other cardiovascular symptoms.  He notes that he can do his spin class III times a week without any issues.  He cannot recall what his symptoms of atrial fibrillation were in the past, but notes that this current symptom does not feel familiar.  PCP has ordered a monitor.  He is off anticoagulation from prior visits with EP.  Plan: - Follow-up monitor results - Echocardiogram to evaluate for any structural contributions to symptoms - Will continue to hold anticoagulation since we do not have any evidence of A-fib recurrence at this time - Further plans pending results  HLD Well-controlled.  Last LDL 58 05/2023.  No known history of clinical or subclinical ASCVD.  Continue Crestor  5 mg daily.       Dispo: RTC 3 months or sooner as needed  Signed, Caron Poser, MD  "

## 2024-05-11 ENCOUNTER — Ambulatory Visit

## 2024-05-29 ENCOUNTER — Ambulatory Visit

## 2024-07-03 ENCOUNTER — Other Ambulatory Visit

## 2024-07-08 ENCOUNTER — Ambulatory Visit: Admitting: Urology

## 2024-07-09 ENCOUNTER — Ambulatory Visit: Admitting: Urology

## 2024-07-21 ENCOUNTER — Ambulatory Visit

## 2024-12-03 ENCOUNTER — Ambulatory Visit: Admitting: Dermatology
